# Patient Record
Sex: Female | Born: 1955 | Race: White | Hispanic: No | Smoking: Never smoker
Health system: Southern US, Community
[De-identification: ages and names within clinical notes are randomized; demographics above are authoritative.]

## PROBLEM LIST (undated history)

## (undated) DIAGNOSIS — I129 Hypertensive chronic kidney disease with stage 1 through stage 4 chronic kidney disease, or unspecified chronic kidney disease: Secondary | ICD-10-CM

## (undated) DIAGNOSIS — E785 Hyperlipidemia, unspecified: Secondary | ICD-10-CM

## (undated) DIAGNOSIS — N189 Chronic kidney disease, unspecified: Secondary | ICD-10-CM

## (undated) DIAGNOSIS — T7840XA Allergy, unspecified, initial encounter: Secondary | ICD-10-CM

## (undated) DIAGNOSIS — E119 Type 2 diabetes mellitus without complications: Secondary | ICD-10-CM

## (undated) DIAGNOSIS — L719 Rosacea, unspecified: Secondary | ICD-10-CM

## (undated) DIAGNOSIS — K219 Gastro-esophageal reflux disease without esophagitis: Secondary | ICD-10-CM

## (undated) DIAGNOSIS — I10 Essential (primary) hypertension: Secondary | ICD-10-CM

## (undated) DIAGNOSIS — K929 Disease of digestive system, unspecified: Secondary | ICD-10-CM

## (undated) DIAGNOSIS — R011 Cardiac murmur, unspecified: Secondary | ICD-10-CM

## (undated) DIAGNOSIS — M1990 Unspecified osteoarthritis, unspecified site: Secondary | ICD-10-CM

## (undated) HISTORY — DX: Allergy, unspecified, initial encounter: T78.40XA

## (undated) HISTORY — DX: Cardiac murmur, unspecified: R01.1

## (undated) HISTORY — DX: Rosacea, unspecified: L71.9

## (undated) HISTORY — DX: Chronic kidney disease, unspecified: N18.9

## (undated) HISTORY — DX: Essential (primary) hypertension: I10

## (undated) HISTORY — DX: Hypertensive chronic kidney disease with stage 1 through stage 4 chronic kidney disease, or unspecified chronic kidney disease: I12.9

## (undated) HISTORY — DX: Hyperlipidemia, unspecified: E78.5

## (undated) HISTORY — DX: Gastro-esophageal reflux disease without esophagitis: K21.9

## (undated) HISTORY — DX: Unspecified osteoarthritis, unspecified site: M19.90

## (undated) HISTORY — DX: Type 2 diabetes mellitus without complications: E11.9

## (undated) HISTORY — DX: Disease of digestive system, unspecified: K92.9

---

## 1983-12-09 HISTORY — PX: TUBAL LIGATION: SHX77

## 2014-10-23 LAB — HM MAMMOGRAPHY: HM Mammogram: NORMAL

## 2015-06-04 ENCOUNTER — Encounter: Payer: Self-pay | Admitting: Unknown Physician Specialty

## 2015-06-04 ENCOUNTER — Ambulatory Visit (INDEPENDENT_AMBULATORY_CARE_PROVIDER_SITE_OTHER): Payer: BLUE CROSS/BLUE SHIELD | Admitting: Unknown Physician Specialty

## 2015-06-04 VITALS — BP 141/79 | HR 80 | Temp 98.1°F | Ht 61.3 in | Wt 192.6 lb

## 2015-06-04 DIAGNOSIS — N181 Chronic kidney disease, stage 1: Secondary | ICD-10-CM

## 2015-06-04 DIAGNOSIS — B373 Candidiasis of vulva and vagina: Secondary | ICD-10-CM

## 2015-06-04 DIAGNOSIS — N185 Chronic kidney disease, stage 5: Secondary | ICD-10-CM | POA: Diagnosis not present

## 2015-06-04 DIAGNOSIS — N183 Chronic kidney disease, stage 3 (moderate): Secondary | ICD-10-CM

## 2015-06-04 DIAGNOSIS — N184 Chronic kidney disease, stage 4 (severe): Secondary | ICD-10-CM

## 2015-06-04 DIAGNOSIS — E785 Hyperlipidemia, unspecified: Secondary | ICD-10-CM

## 2015-06-04 DIAGNOSIS — I129 Hypertensive chronic kidney disease with stage 1 through stage 4 chronic kidney disease, or unspecified chronic kidney disease: Secondary | ICD-10-CM

## 2015-06-04 DIAGNOSIS — B3731 Acute candidiasis of vulva and vagina: Secondary | ICD-10-CM

## 2015-06-04 DIAGNOSIS — N189 Chronic kidney disease, unspecified: Secondary | ICD-10-CM | POA: Insufficient documentation

## 2015-06-04 DIAGNOSIS — N182 Chronic kidney disease, stage 2 (mild): Secondary | ICD-10-CM

## 2015-06-04 DIAGNOSIS — E1165 Type 2 diabetes mellitus with hyperglycemia: Secondary | ICD-10-CM | POA: Insufficient documentation

## 2015-06-04 DIAGNOSIS — E1122 Type 2 diabetes mellitus with diabetic chronic kidney disease: Secondary | ICD-10-CM | POA: Diagnosis not present

## 2015-06-04 DIAGNOSIS — E1169 Type 2 diabetes mellitus with other specified complication: Secondary | ICD-10-CM | POA: Insufficient documentation

## 2015-06-04 LAB — BAYER DCA HB A1C WAIVED: HB A1C (BAYER DCA - WAIVED): 8 % — ABNORMAL HIGH (ref <7.0)

## 2015-06-04 MED ORDER — PRAVASTATIN SODIUM 40 MG PO TABS: 40.0000 mg | ORAL_TABLET | Freq: Every day | ORAL | Status: DC

## 2015-06-04 MED ORDER — LOSARTAN POTASSIUM-HCTZ 100-12.5 MG PO TABS: 1.0000 | ORAL_TABLET | Freq: Every day | ORAL | Status: DC

## 2015-06-04 MED ORDER — SAXAGLIPTIN HCL 5 MG PO TABS: 5.0000 mg | ORAL_TABLET | Freq: Every day | ORAL | Status: DC

## 2015-06-04 MED ORDER — FLUCONAZOLE 150 MG PO TABS: 150.0000 mg | ORAL_TABLET | Freq: Once | ORAL | Status: DC

## 2015-06-04 MED ORDER — METFORMIN HCL 500 MG PO TABS: 500.0000 mg | ORAL_TABLET | Freq: Four times a day (QID) | ORAL | Status: DC

## 2015-06-04 NOTE — Assessment & Plan Note (Addendum)
Increase Losartan from 50-100.  Continue HCTZ at 12.5 mg.

## 2015-06-04 NOTE — Assessment & Plan Note (Addendum)
Since she has a hx of yeast.  Will rx Onglyza 5 mg.  Does not want an injectable

## 2015-06-04 NOTE — Progress Notes (Signed)
BP 141/79 mmHg  Pulse 80  Temp(Src) 98.1 F (36.7 C)  Ht 5' 1.3" (1.557 m)  Wt 192 lb 9.6 oz (87.363 kg)  BMI 36.04 kg/m2  SpO2 95%  LMP  (LMP Unknown)   Subjective:    Patient ID: Christine Booth, female    DOB: 1956/01/28, 59 y.o.   MRN: [id number]  HPI: Christine Booth is a 60 y.o. female  Chief Complaint  Patient presents with  . Diabetes  . Hypertension  . Hyperlipidemia   Diabetes She presents for her follow-up diabetic visit. She has type 2 diabetes mellitus. There are no hypoglycemic associated symptoms. Pertinent negatives for hypoglycemia include no headaches or sweats. Pertinent negatives for diabetes include no blurred vision and no chest pain. There are no hypoglycemic complications. Diabetic complications include nephropathy. Current diabetic treatment includes oral agent (monotherapy) (Metformin). She is compliant with treatment all of the time. Frequency home blood tests: very occasional.  200 this AM. Her home blood glucose trend is increasing steadily. An ACE inhibitor/angiotensin II receptor blocker is being taken. She does not see a podiatrist.Eye exam is not current.  Hypertension This is a new problem. Associated symptoms include malaise/fatigue. Pertinent negatives include no anxiety, blurred vision, chest pain, headaches, neck pain, palpitations, peripheral edema, shortness of breath or sweats. There are no compliance problems.  Hypertensive end-organ damage includes kidney disease.  Hyperlipidemia This is a chronic problem. The problem is controlled. Pertinent negatives include no chest pain, myalgias or shortness of breath. Current antihyperlipidemic treatment includes exercise and statins. There are no compliance problems.     Relevant past medical, surgical, family and social history reviewed and updated as indicated. Interim medical history since our last visit reviewed. Allergies and medications reviewed and updated.  Review of Systems   Constitutional: Positive for malaise/fatigue.  Eyes: Negative for blurred vision.  Respiratory: Negative for shortness of breath.   Cardiovascular: Negative for chest pain and palpitations.  Genitourinary:       Keeps a yeast infection.  Diflucan helped in the past.  Would like a refill  Musculoskeletal: Negative for myalgias and neck pain.  Neurological: Negative for headaches.    Per HPI unless specifically indicated above     Objective:    BP 141/79 mmHg  Pulse 80  Temp(Src) 98.1 F (36.7 C)  Ht 5' 1.3" (1.557 m)  Wt 192 lb 9.6 oz (87.363 kg)  BMI 36.04 kg/m2  SpO2 95%  LMP  (LMP Unknown)  Wt Readings from Last 3 Encounters:  06/04/15 192 lb 9.6 oz (87.363 kg)  03/06/15 196 lb (88.905 kg)    Physical Exam  Constitutional: She is oriented to person, place, and time. She appears well-developed and well-nourished. No distress.  HENT:  Head: Normocephalic and atraumatic.  Eyes: Conjunctivae and lids are normal. Right eye exhibits no discharge. Left eye exhibits no discharge. No scleral icterus.  Cardiovascular: Normal rate, regular rhythm and normal heart sounds.   Pulmonary/Chest: Effort normal and breath sounds normal. No respiratory distress.  Abdominal: Normal appearance. There is no splenomegaly or hepatomegaly.  Musculoskeletal: Normal range of motion.  Neurological: She is alert and oriented to person, place, and time.  Skin: Skin is intact. No rash noted. No pallor.  Psychiatric: She has a normal mood and affect. Her behavior is normal. Judgment and thought content normal.    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  Will rx Onglyza 5 mg.  Does not want an injectable      Relevant  Medications   losartan-hydrochlorothiazide (HYZAAR) 100-12.5 MG per tablet   metFORMIN (GLUCOPHAGE) 500 MG tablet   pravastatin (PRAVACHOL) 40 MG tablet   saxagliptin HCl (ONGLYZA) 5 MG TABS tablet   Other Relevant Orders   Comprehensive metabolic panel   Bayer DCA Hb A1c Waived   POCT glucose (manual entry)     Genitourinary   Chronic kidney disease   Relevant Orders   Comprehensive metabolic panel   Bayer DCA Hb A1c Waived   Hypertensive nephropathy    Increase Losartan from 50-100.  Continue HCTZ at 12.5 mg.        Relevant Medications   losartan-hydrochlorothiazide (HYZAAR) 100-12.5 MG per tablet   Other Relevant Orders   Comprehensive metabolic panel   Bayer DCA Hb A1c Waived     Other   Hyperlipidemia   Relevant Medications   losartan-hydrochlorothiazide (HYZAAR) 100-12.5 MG per tablet   pravastatin (PRAVACHOL) 40 MG tablet    Other Visit Diagnoses    Yeast infection of the vagina        I have seen before on examination.  Will rx Diflucan    Relevant Medications    fluconazole (DIFLUCAN) 150 MG tablet        Follow up plan: Return in about 3 months (around 09/04/2015).  Needs Lipid panel, CMP, Hgb A1C, CMP, Microalbumin, and Uric Acid

## 2015-06-05 LAB — COMPREHENSIVE METABOLIC PANEL
ALT: 16 IU/L (ref 0–32)
AST: 19 IU/L (ref 0–40)
Albumin/Globulin Ratio: 1.9 (ref 1.1–2.5)
Albumin: 4.4 g/dL (ref 3.6–4.8)
Alkaline Phosphatase: 77 IU/L (ref 39–117)
BUN/Creatinine Ratio: 14 (ref 11–26)
BUN: 10 mg/dL (ref 8–27)
Bilirubin Total: 0.3 mg/dL (ref 0.0–1.2)
CO2: 21 mmol/L (ref 18–29)
Calcium: 9.4 mg/dL (ref 8.7–10.3)
Chloride: 100 mmol/L (ref 97–108)
Creatinine, Ser: 0.72 mg/dL (ref 0.57–1.00)
GFR calc Af Amer: 105 mL/min/{1.73_m2} (ref >59)
GFR calc non Af Amer: 91 mL/min/{1.73_m2} (ref >59)
Globulin, Total: 2.3 g/dL (ref 1.5–4.5)
Glucose: 202 mg/dL — ABNORMAL HIGH (ref 65–99)
Potassium: 4.3 mmol/L (ref 3.5–5.2)
Sodium: 140 mmol/L (ref 134–144)
Total Protein: 6.7 g/dL (ref 6.0–8.5)

## 2015-06-13 ENCOUNTER — Telehealth: Payer: Self-pay

## 2015-09-04 ENCOUNTER — Ambulatory Visit (INDEPENDENT_AMBULATORY_CARE_PROVIDER_SITE_OTHER): Payer: BLUE CROSS/BLUE SHIELD | Admitting: Unknown Physician Specialty

## 2015-09-04 ENCOUNTER — Encounter: Payer: Self-pay | Admitting: Unknown Physician Specialty

## 2015-09-04 VITALS — BP 128/79 | HR 78 | Temp 98.5°F | Ht 61.2 in | Wt 189.8 lb

## 2015-09-04 DIAGNOSIS — N181 Chronic kidney disease, stage 1: Secondary | ICD-10-CM

## 2015-09-04 DIAGNOSIS — N183 Chronic kidney disease, stage 3 (moderate): Secondary | ICD-10-CM

## 2015-09-04 DIAGNOSIS — N182 Chronic kidney disease, stage 2 (mild): Secondary | ICD-10-CM

## 2015-09-04 DIAGNOSIS — I129 Hypertensive chronic kidney disease with stage 1 through stage 4 chronic kidney disease, or unspecified chronic kidney disease: Secondary | ICD-10-CM | POA: Diagnosis not present

## 2015-09-04 DIAGNOSIS — E1122 Type 2 diabetes mellitus with diabetic chronic kidney disease: Secondary | ICD-10-CM | POA: Diagnosis not present

## 2015-09-04 DIAGNOSIS — E785 Hyperlipidemia, unspecified: Secondary | ICD-10-CM | POA: Diagnosis not present

## 2015-09-04 DIAGNOSIS — N185 Chronic kidney disease, stage 5: Secondary | ICD-10-CM

## 2015-09-04 DIAGNOSIS — N184 Chronic kidney disease, stage 4 (severe): Secondary | ICD-10-CM | POA: Diagnosis not present

## 2015-09-04 DIAGNOSIS — B373 Candidiasis of vulva and vagina: Secondary | ICD-10-CM | POA: Diagnosis not present

## 2015-09-04 DIAGNOSIS — B3731 Acute candidiasis of vulva and vagina: Secondary | ICD-10-CM

## 2015-09-04 DIAGNOSIS — N189 Chronic kidney disease, unspecified: Secondary | ICD-10-CM

## 2015-09-04 DIAGNOSIS — Z23 Encounter for immunization: Secondary | ICD-10-CM | POA: Diagnosis not present

## 2015-09-04 LAB — LIPID PANEL PICCOLO, WAIVED
Chol/HDL Ratio Piccolo,Waive: 3.2 mg/dL
Cholesterol Piccolo, Waived: 149 mg/dL (ref <200)
HDL Chol Piccolo, Waived: 47 mg/dL — ABNORMAL LOW (ref >59)
LDL Chol Calc Piccolo Waived: 75 mg/dL (ref <100)
Triglycerides Piccolo,Waived: 135 mg/dL (ref <150)
VLDL Chol Calc Piccolo,Waive: 27 mg/dL (ref <30)

## 2015-09-04 LAB — MICROALBUMIN, URINE WAIVED
Creatinine, Urine Waived: 200 mg/dL (ref 10–300)
Microalb, Ur Waived: 10 mg/L (ref 0–19)
Microalb/Creat Ratio: <30 mg/g (ref <30)

## 2015-09-04 LAB — BAYER DCA HB A1C WAIVED: HB A1C (BAYER DCA - WAIVED): 7.4 % — ABNORMAL HIGH (ref <7.0)

## 2015-09-04 MED ORDER — FLUCONAZOLE 150 MG PO TABS: 150.0000 mg | ORAL_TABLET | Freq: Every day | ORAL | Status: DC

## 2015-09-04 NOTE — Progress Notes (Signed)
BP 128/79 mmHg  Pulse 78  Temp(Src) 98.5 F (36.9 C)  Ht 5' 1.2" (1.554 m)  Wt 189 lb 12.8 oz (86.093 kg)  BMI 35.65 kg/m2  SpO2 98%  LMP  (LMP Unknown)   Subjective:    Patient ID: Christine Booth, female    DOB: 08-31-1956, 59 y.o.   MRN: [id number]  HPI: Christine Booth is a 59 y.o. female  Chief Complaint  Patient presents with  . Diabetes  . Hyperlipidemia  . Hypertension   Diabetes: Follow up visit of type 2 diabetes. Last visit A1C was increased to 8.0. Saxagliptan was added to regimen with continuation of metformin. Compliant with treatment, no missed doses.She was constipated for a few days after starting saxagliptan and took stool softeners without further difficulties.. No No diabetic complications. Denies headaches, diaphoresis, chest pain, blurred vision.   Hypertension/Hyperlipidemia: Blood pressure was not under control at last visit and lotensin was increased. She has not monitored her blood pressure at home. She denies headaches, chest pain, peripheral edema or shortness of breath. Blood pressure is well controlled today. She has been compliant with medication, no missed doses. Currently taking pravastatin, controlled cholesterol levels.   Yeast Infection: Recurrent infection. Provided prescription of diflucan which has worked in the past.     Relevant past medical, surgical, family and social history reviewed and updated as indicated. Interim medical history since our last visit reviewed. Allergies and medications reviewed and updated.  Review of Systems  Constitutional: Negative.  Negative for fever, chills, activity change, appetite change and fatigue.  HENT: Negative.  Negative for congestion, ear pain, postnasal drip, rhinorrhea, sinus pressure, sneezing, sore throat and tinnitus.   Eyes: Negative.  Negative for discharge and redness.  Respiratory: Negative.  Negative for cough, chest tightness, shortness of breath, wheezing and stridor.    Cardiovascular: Negative.  Negative for chest pain, palpitations and leg swelling.  Gastrointestinal: Negative.  Negative for nausea, diarrhea and constipation.  Genitourinary: Negative.  Negative for dysuria, frequency, hematuria and difficulty urinating.  Musculoskeletal: Negative.  Negative for back pain, joint swelling and arthralgias.  Skin: Negative.  Negative for color change, pallor and rash.  Neurological: Negative for dizziness, syncope, light-headedness, numbness and headaches.  Psychiatric/Behavioral: Negative.  Negative for confusion, sleep disturbance, self-injury, decreased concentration and agitation. The patient is not nervous/anxious.     Per HPI unless specifically indicated above     Objective:    BP 128/79 mmHg  Pulse 78  Temp(Src) 98.5 F (36.9 C)  Ht 5' 1.2" (1.554 m)  Wt 189 lb 12.8 oz (86.093 kg)  BMI 35.65 kg/m2  SpO2 98%  LMP  (LMP Unknown)  Wt Readings from Last 3 Encounters:  09/04/15 189 lb 12.8 oz (86.093 kg)  06/04/15 192 lb 9.6 oz (87.363 kg)  03/06/15 196 lb (88.905 kg)    Physical Exam  Constitutional: She is oriented to person, place, and time. She appears well-developed and well-nourished. No distress.  HENT:  Head: Normocephalic and atraumatic.  Eyes: Conjunctivae are normal.  Neck: Normal range of motion.  Cardiovascular: Normal rate, regular rhythm and normal heart sounds.  Exam reveals no gallop and no friction rub.   No murmur heard. Pulmonary/Chest: Effort normal and breath sounds normal. No respiratory distress. She has no wheezes. She has no rales.  Musculoskeletal: Normal range of motion. She exhibits no edema or tenderness.  Neurological: She is alert and oriented to person, place, and time.  Skin: Skin is warm and dry. No  rash noted. She is not diaphoretic. No erythema. No pallor.  Psychiatric: She has a normal mood and affect. Her behavior is normal. Thought content normal.        Assessment & Plan:   Problem List Items  Addressed This Visit      Unprioritized   Hyperlipidemia    Labs stable. Well controlled on pravastatin 40mg .      Relevant Orders   Lipid Panel [place], Waived   Type 2 diabetes mellitus with diabetic chronic kidney disease    A1C 7.4 today. Improved from three months ago. Will continue working on diet and exercise and recheck in three months at physical.      Relevant Orders   Bayer DCA Hb A1c Waived   Microalbumin, Urine Waived   Uric acid   Chronic kidney disease   Relevant Orders   Comprehensive metabolic panel   Hypertensive nephropathy    Well controlled on increased regimen of losartan-hydrochlorothiazide 100-12.5mg .        Other Visit Diagnoses    Immunization due    -  Primary    Relevant Orders    Flu Vaccine QUAD 36+ mos PF IM (Fluarix & Fluzone Quad PF) (Completed)    Vaginal yeast infection        Recurrent condition. Prescription given for diflucan    Relevant Medications    fluconazole (DIFLUCAN) 150 MG tablet        Follow up plan: Return in about 3 months (around 12/04/2015) for physical.

## 2015-09-04 NOTE — Assessment & Plan Note (Signed)
Labs stable. Well controlled on pravastatin 40mg .

## 2015-09-04 NOTE — Assessment & Plan Note (Signed)
A1C 7.4 today. Improved from three months ago. Will continue working on diet and exercise and recheck in three months at physical.

## 2015-09-04 NOTE — Assessment & Plan Note (Signed)
Well controlled on increased regimen of losartan-hydrochlorothiazide 100-12.5mg .

## 2015-09-05 LAB — COMPREHENSIVE METABOLIC PANEL
ALT: 15 IU/L (ref 0–32)
AST: 16 IU/L (ref 0–40)
Albumin/Globulin Ratio: 2 (ref 1.1–2.5)
Albumin: 4.3 g/dL (ref 3.6–4.8)
Alkaline Phosphatase: 67 IU/L (ref 39–117)
BUN/Creatinine Ratio: 13 (ref 11–26)
BUN: 10 mg/dL (ref 8–27)
Bilirubin Total: 0.2 mg/dL (ref 0.0–1.2)
CO2: 20 mmol/L (ref 18–29)
Calcium: 9.5 mg/dL (ref 8.7–10.3)
Chloride: 102 mmol/L (ref 97–108)
Creatinine, Ser: 0.75 mg/dL (ref 0.57–1.00)
GFR calc Af Amer: 100 mL/min/{1.73_m2} (ref >59)
GFR calc non Af Amer: 87 mL/min/{1.73_m2} (ref >59)
Globulin, Total: 2.2 g/dL (ref 1.5–4.5)
Glucose: 163 mg/dL — ABNORMAL HIGH (ref 65–99)
Potassium: 4 mmol/L (ref 3.5–5.2)
Sodium: 141 mmol/L (ref 134–144)
Total Protein: 6.5 g/dL (ref 6.0–8.5)

## 2015-09-05 LAB — URIC ACID: Uric Acid: 3.6 mg/dL (ref 2.5–7.1)

## 2015-10-17 ENCOUNTER — Other Ambulatory Visit: Payer: Self-pay | Admitting: Unknown Physician Specialty

## 2015-11-12 ENCOUNTER — Other Ambulatory Visit: Payer: Self-pay | Admitting: Unknown Physician Specialty

## 2015-11-13 NOTE — Telephone Encounter (Signed)
done

## 2015-11-15 ENCOUNTER — Encounter: Payer: Self-pay | Admitting: Family Medicine

## 2015-11-15 ENCOUNTER — Ambulatory Visit (INDEPENDENT_AMBULATORY_CARE_PROVIDER_SITE_OTHER): Payer: BLUE CROSS/BLUE SHIELD | Admitting: Family Medicine

## 2015-11-15 VITALS — BP 132/84 | HR 86 | Temp 98.6°F | Ht 62.0 in | Wt 192.6 lb

## 2015-11-15 DIAGNOSIS — R399 Unspecified symptoms and signs involving the genitourinary system: Secondary | ICD-10-CM

## 2015-11-15 DIAGNOSIS — R3129 Other microscopic hematuria: Secondary | ICD-10-CM

## 2015-11-15 DIAGNOSIS — Z8619 Personal history of other infectious and parasitic diseases: Secondary | ICD-10-CM

## 2015-11-15 DIAGNOSIS — Z8742 Personal history of other diseases of the female genital tract: Secondary | ICD-10-CM | POA: Diagnosis not present

## 2015-11-15 LAB — MICROSCOPIC EXAMINATION

## 2015-11-15 MED ORDER — NITROFURANTOIN MONOHYD MACRO 100 MG PO CAPS: 100.0000 mg | ORAL_CAPSULE | Freq: Two times a day (BID) | ORAL | Status: DC

## 2015-11-15 MED ORDER — FLUCONAZOLE 150 MG PO TABS: 150.0000 mg | ORAL_TABLET | Freq: Every day | ORAL | Status: DC

## 2015-11-15 NOTE — Progress Notes (Signed)
BP 132/84 mmHg  Pulse 86  Temp(Src) 98.6 F (37 C)  Ht 5\' 2"  (1.575 m)  Wt 192 lb 9.6 oz (87.363 kg)  BMI 35.22 kg/m2  SpO2 98%  LMP  (LMP Unknown)   Subjective:    Patient ID: Christine Booth, female    DOB: Jul 04, 1956, 59 y.o.   MRN: [id number]  HPI: Christine Booth is a 59 y.o. female  Chief Complaint  Patient presents with  . Dysuria  . Urinary Frequency  . Urinary Urgency   Urinary frequency and urgency; no foul smell She has some burning at the end of urination; pressure very low No recent bladder infections No fevers No long car trips or holding back her urine No pain in the lower back or flank Had some pain in the left hip, just when laying down  No nausea or vomiting Not sexually active, no horseback riding NKDA No recent antibiotics  She has diabetes, and get the external yeast infections; getting a little better lately; not taking diabetic pill that increases risk of urine or yeast infections My blessing to not check FSBS  Past Medical History  Diagnosis Date  . Allergy   . Hyperlipidemia   . Hypertension   . Diabetes mellitus without complication ([place])   . GERD (gastroesophageal reflux disease)   . Chronic kidney disease stage 1  . Heart murmur   . Gastrointestinal disorder   . Rosacea    Relevant past medical, surgical, family and social history reviewed and updated as indicated. Interim medical history since our last visit reviewed. Allergies and medications reviewed and updated.  Review of Systems Per HPI unless specifically indicated above     Objective:    BP 132/84 mmHg  Pulse 86  Temp(Src) 98.6 F (37 C)  Ht 5\' 2"  (1.575 m)  Wt 192 lb 9.6 oz (87.363 kg)  BMI 35.22 kg/m2  SpO2 98%  LMP  (LMP Unknown)  Wt Readings from Last 3 Encounters:  11/15/15 192 lb 9.6 oz (87.363 kg)  09/04/15 189 lb 12.8 oz (86.093 kg)  06/04/15 192 lb 9.6 oz (87.363 kg)    Physical Exam  Constitutional: She appears well-developed and  well-nourished. No distress.  Eyes: No scleral icterus.  Cardiovascular: Normal rate and regular rhythm.   Pulmonary/Chest: Effort normal and breath sounds normal.  Abdominal: Soft. She exhibits no distension. There is no tenderness. There is no guarding.  Skin: She is not diaphoretic.  Psychiatric: She has a normal mood and affect.  Urine micro: WBC 11-30 per hpf, RBC 11-30 per hpf    Assessment & Plan:   Problem List Items Addressed This Visit    None    Visit Diagnoses    UTI symptoms    -  Primary    hydration, start antibiotics; culture pending; see AVS    Relevant Medications    fluconazole (DIFLUCAN) 150 MG tablet    nitrofurantoin, macrocrystal-monohydrate, (MACROBID) 100 MG capsule    Other Relevant Orders    UA/M w/rflx Culture, Routine (Completed)    Microscopic hematuria        recheck urine at follow-up with primary in January; discussed with patient    Hx of candidal vulvovaginitis        Rx for fluconazole given to be used only if needed; do not take statin for 3 days if this is used       Follow up plan: Return if symptoms worsen or fail to improve.  Meds ordered this encounter  Medications  . fluconazole (DIFLUCAN) 150 MG tablet    Sig: Take 1 tablet (150 mg total) by mouth daily.    Dispense:  1 tablet    Refill:  0  . nitrofurantoin, macrocrystal-monohydrate, (MACROBID) 100 MG capsule    Sig: Take 1 capsule (100 mg total) by mouth 2 (two) times daily.    Dispense:  6 capsule    Refill:  0   An after-visit summary was printed and given to the patient at [place].  Please see the patient instructions which may contain other information and recommendations beyond what is mentioned above in the assessment and plan.

## 2015-11-15 NOTE — Patient Instructions (Signed)
If you take the fluconazole, do not take your cholesterol medicine for 3 days Start the new antibiotics Hydration and rest Watch for s/s of worsening infection: fever, back pain, vomiting Call us if needed When you see Christine Booth in January, get a recheck of your urine to make sure the blood is gone

## 2015-11-18 LAB — URINE CULTURE, REFLEX

## 2015-11-19 ENCOUNTER — Telehealth: Payer: Self-pay | Admitting: Family Medicine

## 2015-11-19 LAB — UA/M W/RFLX CULTURE, ROUTINE

## 2015-11-19 NOTE — Telephone Encounter (Signed)
I called home to let patient know medicine should have done the trick; call if any symptoms or problems Treated with macrobid; sensitive on culture

## 2015-12-11 ENCOUNTER — Ambulatory Visit (INDEPENDENT_AMBULATORY_CARE_PROVIDER_SITE_OTHER): Payer: BLUE CROSS/BLUE SHIELD | Admitting: Unknown Physician Specialty

## 2015-12-11 ENCOUNTER — Encounter: Payer: Self-pay | Admitting: Unknown Physician Specialty

## 2015-12-11 VITALS — BP 140/87 | HR 93 | Temp 98.7°F | Ht 61.3 in | Wt 190.2 lb

## 2015-12-11 DIAGNOSIS — Z23 Encounter for immunization: Secondary | ICD-10-CM

## 2015-12-11 DIAGNOSIS — I129 Hypertensive chronic kidney disease with stage 1 through stage 4 chronic kidney disease, or unspecified chronic kidney disease: Secondary | ICD-10-CM | POA: Insufficient documentation

## 2015-12-11 DIAGNOSIS — N189 Chronic kidney disease, unspecified: Secondary | ICD-10-CM

## 2015-12-11 DIAGNOSIS — J0100 Acute maxillary sinusitis, unspecified: Secondary | ICD-10-CM

## 2015-12-11 DIAGNOSIS — E785 Hyperlipidemia, unspecified: Secondary | ICD-10-CM | POA: Diagnosis not present

## 2015-12-11 DIAGNOSIS — N898 Other specified noninflammatory disorders of vagina: Secondary | ICD-10-CM

## 2015-12-11 DIAGNOSIS — L298 Other pruritus: Secondary | ICD-10-CM

## 2015-12-11 DIAGNOSIS — E1122 Type 2 diabetes mellitus with diabetic chronic kidney disease: Secondary | ICD-10-CM | POA: Diagnosis not present

## 2015-12-11 DIAGNOSIS — Z0000 Encounter for general adult medical examination without abnormal findings: Secondary | ICD-10-CM

## 2015-12-11 HISTORY — DX: Hypertensive chronic kidney disease with stage 1 through stage 4 chronic kidney disease, or unspecified chronic kidney disease: I12.9

## 2015-12-11 LAB — MICROALBUMIN, URINE WAIVED
Creatinine, Urine Waived: 100 mg/dL (ref 10–300)
Microalb, Ur Waived: 10 mg/L (ref 0–19)
Microalb/Creat Ratio: <30 mg/g (ref <30)

## 2015-12-11 LAB — UA/M W/RFLX CULTURE, ROUTINE
Bilirubin, UA: NEGATIVE
Glucose, UA: NEGATIVE
Ketones, UA: NEGATIVE
Leukocytes, UA: NEGATIVE
Nitrite, UA: NEGATIVE
Protein, UA: NEGATIVE
RBC, UA: NEGATIVE
Specific Gravity, UA: 1.015 (ref 1.005–1.030)
Urobilinogen, Ur: 0.2 mg/dL (ref 0.2–1.0)
pH, UA: 6.5 (ref 5.0–7.5)

## 2015-12-11 MED ORDER — AZITHROMYCIN 250 MG PO TABS: ORAL_TABLET | ORAL | Status: DC

## 2015-12-11 MED ORDER — METFORMIN HCL 500 MG PO TABS: 500.0000 mg | ORAL_TABLET | Freq: Four times a day (QID) | ORAL | Status: DC

## 2015-12-11 MED ORDER — LOSARTAN POTASSIUM-HCTZ 100-12.5 MG PO TABS: 1.0000 | ORAL_TABLET | Freq: Every day | ORAL | Status: DC

## 2015-12-11 MED ORDER — FLUTICASONE PROPIONATE 50 MCG/ACT NA SUSP: 2.0000 | Freq: Every day | NASAL | Status: DC

## 2015-12-11 MED ORDER — SAXAGLIPTIN HCL 5 MG PO TABS: 5.0000 mg | ORAL_TABLET | Freq: Every day | ORAL | Status: DC

## 2015-12-11 MED ORDER — PANTOPRAZOLE SODIUM 40 MG PO TBEC: 40.0000 mg | DELAYED_RELEASE_TABLET | Freq: Every day | ORAL | Status: DC

## 2015-12-11 MED ORDER — PRAVASTATIN SODIUM 40 MG PO TABS: 40.0000 mg | ORAL_TABLET | Freq: Every day | ORAL | Status: DC

## 2015-12-11 MED ORDER — FLUCONAZOLE 150 MG PO TABS: 150.0000 mg | ORAL_TABLET | Freq: Once | ORAL | Status: DC

## 2015-12-11 NOTE — Assessment & Plan Note (Signed)
Hgb A1C is pending

## 2015-12-11 NOTE — Assessment & Plan Note (Signed)
Lipid panel is pending

## 2015-12-11 NOTE — Progress Notes (Signed)
BP 140/87 mmHg  Pulse 93  Temp(Src) 98.7 F (37.1 C)  Ht 5' 1.3" (1.557 m)  Wt 190 lb 3.2 oz (86.274 kg)  BMI 35.59 kg/m2  SpO2 97%  LMP  (LMP Unknown)   Subjective:    Patient ID: Christine Booth, female    DOB: 08-Dec-1956, 60 y.o.   MRN: [id number]  HPI: Christine Booth is a 60 y.o. female  Chief Complaint  Patient presents with  . Annual Exam    pt states she would like to get shingles vaccine if good with provider  . URI    pt states she has been having some congestion that started in nasal but has moved to her chest  . Vaginal Itching    pt states she has been having some itching on the outside of her vagina   URI  This is a new problem. The current episode started 1 to 4 weeks ago. The problem has been waxing and waning. There has been no fever. Associated symptoms include congestion and coughing. She has tried nothing for the symptoms.  Vaginal Itching The patient's primary symptoms include genital itching. The patient's pertinent negatives include no vaginal discharge. This is a chronic problem. The current episode started more than 1 month ago. The problem occurs intermittently. The problem has been unchanged. The patient is experiencing no pain. The problem affects both sides.   Diabetes:  Using medications without difficulties No hypoglycemic episodes No hyperglycemic episodes Feet problems:none Blood Sugars averaging: No eye exam within last year and patient asked to get another  Hypertension:  Using medications without difficulty Average home BPs: not checking   Using medication without problems or lightheadedness No chest pain with exertion or shortness of breath No Edema   Elevated Cholesterol: Using medications without problems: No Muscle aches:none  Diet compliance:"I could do better" Exercise:Zumba 3 days/week      Relevant past medical, surgical, family and social history reviewed and updated as indicated. Interim medical history  since our last visit reviewed. Allergies and medications reviewed and updated.  Review of Systems  HENT: Positive for congestion.   Respiratory: Positive for cough.   Genitourinary: Negative for vaginal discharge.    Per HPI unless specifically indicated above     Objective:    BP 140/87 mmHg  Pulse 93  Temp(Src) 98.7 F (37.1 C)  Ht 5' 1.3" (1.557 m)  Wt 190 lb 3.2 oz (86.274 kg)  BMI 35.59 kg/m2  SpO2 97%  LMP  (LMP Unknown)  Wt Readings from Last 3 Encounters:  12/11/15 190 lb 3.2 oz (86.274 kg)  11/15/15 192 lb 9.6 oz (87.363 kg)  09/04/15 189 lb 12.8 oz (86.093 kg)    Physical Exam  Constitutional: She is oriented to person, place, and time. She appears well-developed and well-nourished.  HENT:  Head: Normocephalic and atraumatic.  Eyes: Pupils are equal, round, and reactive to light. Right eye exhibits no discharge. Left eye exhibits no discharge. No scleral icterus.  Neck: Normal range of motion. Neck supple. Carotid bruit is not present. No thyromegaly present.  Cardiovascular: Normal rate, regular rhythm and normal heart sounds.  Exam reveals no gallop and no friction rub.   No murmur heard. Pulmonary/Chest: Effort normal and breath sounds normal. No respiratory distress. She has no wheezes. She has no rales.  Abdominal: Soft. Bowel sounds are normal. There is no tenderness. There is no rebound.  Genitourinary: No breast swelling, tenderness or discharge.  Whitening hood of labia  Musculoskeletal:  Normal range of motion.  Lymphadenopathy:    She has no cervical adenopathy.  Neurological: She is alert and oriented to person, place, and time.  Skin: Skin is warm, dry and intact. No rash noted.  Psychiatric: She has a normal mood and affect. Her speech is normal and behavior is normal. Judgment and thought content normal. Cognition and memory are normal.    Results for orders placed or performed in visit on 11/15/15  Microscopic Examination  Result Value Ref  Range   WBC, UA 11-30 (A) 0 -  5 /hpf   RBC, UA 11-30 (A) 0 -  2 /hpf   Epithelial Cells (non renal) 0-10 0 - 10 /hpf   Bacteria, UA Few None seen/Few  UA/M w/rflx Culture, Routine  Result Value Ref Range   Urine Culture, Routine Final report (A)    Urine Culture result 1 Comment (A)    ANTIMICROBIAL SUSCEPTIBILITY Comment   Urine Culture, Routine  Result Value Ref Range   Urine Culture result 1 Gram negative rods (A)       Assessment & Plan:   Problem List Items Addressed This Visit      Unprioritized   Hyperlipidemia    Lipid panel is pending      Relevant Medications   losartan-hydrochlorothiazide (HYZAAR) 100-12.5 MG tablet   pravastatin (PRAVACHOL) 40 MG tablet   Other Relevant Orders   Lipid Panel w/o Chol/HDL Ratio   Diabetes type 2, controlled (HCC) - Primary    Hgb A1C is pending      Relevant Medications   losartan-hydrochlorothiazide (HYZAAR) 100-12.5 MG tablet   metFORMIN (GLUCOPHAGE) 500 MG tablet   saxagliptin HCl (ONGLYZA) 5 MG TABS tablet   pravastatin (PRAVACHOL) 40 MG tablet   Benign hypertension with chronic kidney disease   Relevant Medications   losartan-hydrochlorothiazide (HYZAAR) 100-12.5 MG tablet   pravastatin (PRAVACHOL) 40 MG tablet   Other Relevant Orders   Comprehensive metabolic panel   Microalbumin, Urine Waived   Uric acid    Other Visit Diagnoses    Routine general medical examination at a health care facility        Relevant Orders    HIV antibody    Hepatitis C antibody    TSH    CBC    Cologuard    Immunization due        Relevant Orders    Varicella-zoster vaccine subcutaneous (Completed)    Itching in the vaginal area        Rx for Diflucan.  I suspect yeast    Relevant Orders    UA/M w/rflx Culture, Routine    Acute maxillary sinusitis, recurrence not specified        Relevant Medications    fluconazole (DIFLUCAN) 150 MG tablet    fluticasone (FLONASE) 50 MCG/ACT nasal spray    azithromycin (ZITHROMAX Z-PAK) 250  MG tablet    Hypertensive nephropathy, stage 1-4 or unspecified chronic kidney disease (HCC)        Relevant Medications    losartan-hydrochlorothiazide (HYZAAR) 100-12.5 MG tablet        Follow up plan: Return in about 3 months (around 03/10/2016) for f/u.

## 2015-12-12 LAB — URIC ACID: Uric Acid: 3.8 mg/dL (ref 2.5–7.1)

## 2015-12-12 LAB — CBC
Hematocrit: 39.7 % (ref 34.0–46.6)
Hemoglobin: 12.9 g/dL (ref 11.1–15.9)
MCH: 27.6 pg (ref 26.6–33.0)
MCHC: 32.5 g/dL (ref 31.5–35.7)
MCV: 85 fL (ref 79–97)
Platelets: 282 10*3/uL (ref 150–379)
RBC: 4.68 x10E6/uL (ref 3.77–5.28)
RDW: 14.7 % (ref 12.3–15.4)
WBC: 3.5 10*3/uL (ref 3.4–10.8)

## 2015-12-12 LAB — COMPREHENSIVE METABOLIC PANEL
ALT: 28 IU/L (ref 0–32)
AST: 27 IU/L (ref 0–40)
Albumin/Globulin Ratio: 1.9 (ref 1.1–2.5)
Albumin: 4.3 g/dL (ref 3.6–4.8)
Alkaline Phosphatase: 73 IU/L (ref 39–117)
BUN/Creatinine Ratio: 9 — ABNORMAL LOW (ref 11–26)
BUN: 6 mg/dL — ABNORMAL LOW (ref 8–27)
Bilirubin Total: 0.4 mg/dL (ref 0.0–1.2)
CO2: 23 mmol/L (ref 18–29)
Calcium: 9.6 mg/dL (ref 8.7–10.3)
Chloride: 101 mmol/L (ref 96–106)
Creatinine, Ser: 0.68 mg/dL (ref 0.57–1.00)
GFR calc Af Amer: 109 mL/min/{1.73_m2} (ref >59)
GFR calc non Af Amer: 95 mL/min/{1.73_m2} (ref >59)
Globulin, Total: 2.3 g/dL (ref 1.5–4.5)
Glucose: 150 mg/dL — ABNORMAL HIGH (ref 65–99)
Potassium: 4.1 mmol/L (ref 3.5–5.2)
Sodium: 141 mmol/L (ref 134–144)
Total Protein: 6.6 g/dL (ref 6.0–8.5)

## 2015-12-12 LAB — LIPID PANEL W/O CHOL/HDL RATIO
Cholesterol, Total: 136 mg/dL (ref 100–199)
HDL: 38 mg/dL — ABNORMAL LOW (ref >39)
LDL Calculated: 69 mg/dL (ref 0–99)
Triglycerides: 143 mg/dL (ref 0–149)
VLDL Cholesterol Cal: 29 mg/dL (ref 5–40)

## 2015-12-12 LAB — TSH: TSH: 2.21 u[IU]/mL (ref 0.450–4.500)

## 2015-12-12 LAB — HEPATITIS C ANTIBODY: Hep C Virus Ab: <0.1 s/co ratio (ref 0.0–0.9)

## 2015-12-12 LAB — HIV ANTIBODY (ROUTINE TESTING W REFLEX): HIV Screen 4th Generation wRfx: NONREACTIVE

## 2015-12-13 LAB — HGB A1C W/O EAG: Hgb A1c MFr Bld: 7.5 % — ABNORMAL HIGH (ref 4.8–5.6)

## 2015-12-13 LAB — SPECIMEN STATUS REPORT

## 2016-02-13 ENCOUNTER — Encounter: Payer: Self-pay | Admitting: Unknown Physician Specialty

## 2016-02-13 ENCOUNTER — Ambulatory Visit (INDEPENDENT_AMBULATORY_CARE_PROVIDER_SITE_OTHER): Payer: BLUE CROSS/BLUE SHIELD | Admitting: Unknown Physician Specialty

## 2016-02-13 VITALS — BP 130/65 | HR 89 | Temp 98.4°F | Ht 60.7 in | Wt 194.6 lb

## 2016-02-13 DIAGNOSIS — R300 Dysuria: Secondary | ICD-10-CM | POA: Diagnosis not present

## 2016-02-13 DIAGNOSIS — N3090 Cystitis, unspecified without hematuria: Secondary | ICD-10-CM | POA: Diagnosis not present

## 2016-02-13 MED ORDER — FLUCONAZOLE 150 MG PO TABS: 150.0000 mg | ORAL_TABLET | Freq: Once | ORAL | Status: DC

## 2016-02-13 MED ORDER — NITROFURANTOIN MONOHYD MACRO 100 MG PO CAPS: 100.0000 mg | ORAL_CAPSULE | Freq: Two times a day (BID) | ORAL | Status: DC

## 2016-02-13 NOTE — Progress Notes (Signed)
   BP 130/65 mmHg  Pulse 89  Temp(Src) 98.4 F (36.9 C)  Ht 5' 0.7" (1.542 m)  Wt 194 lb 9.6 oz (88.27 kg)  BMI 37.12 kg/m2  SpO2 97%  LMP  (LMP Unknown)   Subjective:    Patient ID: Christine Booth, female    DOB: Aug 02, 1956, 60 y.o.   MRN: [id number]  HPI: Christine Booth is a 60 y.o. female  Chief Complaint  Patient presents with  . Urinary Tract Infection    pt states she has had some blood in urine, pressure, burning with urination and urinary frequency. States symptoms started this morning.    Urinary Tract Infection  This is a new problem. The current episode started today. The problem has been unchanged. The quality of the pain is described as burning. There has been no fever. Associated symptoms include frequency, hematuria and urgency. Pertinent negatives include no chills. She has tried nothing for the symptoms. There is no history of catheterization or kidney stones.     Relevant past medical, surgical, family and social history reviewed and updated as indicated. Interim medical history since our last visit reviewed. Allergies and medications reviewed and updated.  Review of Systems  Constitutional: Negative for chills.  Genitourinary: Positive for urgency, frequency and hematuria.    Per HPI unless specifically indicated above     Objective:    BP 130/65 mmHg  Pulse 89  Temp(Src) 98.4 F (36.9 C)  Ht 5' 0.7" (1.542 m)  Wt 194 lb 9.6 oz (88.27 kg)  BMI 37.12 kg/m2  SpO2 97%  LMP  (LMP Unknown)  Wt Readings from Last 3 Encounters:  02/13/16 194 lb 9.6 oz (88.27 kg)  12/11/15 190 lb 3.2 oz (86.274 kg)  11/15/15 192 lb 9.6 oz (87.363 kg)    Physical Exam  Constitutional: She is oriented to person, place, and time. She appears well-developed and well-nourished. No distress.  HENT:  Head: Normocephalic and atraumatic.  Eyes: Conjunctivae and lids are normal. Right eye exhibits no discharge. Left eye exhibits no discharge. No scleral icterus.   Cardiovascular: Normal rate and regular rhythm.   Pulmonary/Chest: Effort normal. No respiratory distress.  Abdominal: Soft. Normal appearance and bowel sounds are normal. She exhibits no distension. There is no splenomegaly or hepatomegaly. There is no tenderness. There is no CVA tenderness.  Musculoskeletal: Normal range of motion.  Neurological: She is alert and oriented to person, place, and time.  Skin: Skin is intact. No rash noted. No pallor.  Psychiatric: She has a normal mood and affect. Her behavior is normal. Judgment and thought content normal.  Nursing note and vitals reviewed.  Urine positive Assessment & Plan:   Problem List Items Addressed This Visit    None    Visit Diagnoses    Burning with urination    -  Primary    Relevant Orders    UA/M w/rflx Culture, Routine    Cystitis        Rx for Macrobid        Follow up plan: No Follow-up on file.

## 2016-02-16 LAB — URINE CULTURE, REFLEX

## 2016-02-16 LAB — UA/M W/RFLX CULTURE, ROUTINE
Bilirubin, UA: NEGATIVE
Nitrite, UA: NEGATIVE
Protein, UA: NEGATIVE
Specific Gravity, UA: 1.025 (ref 1.005–1.030)
Urobilinogen, Ur: 0.2 mg/dL (ref 0.2–1.0)
pH, UA: 5.5 (ref 5.0–7.5)

## 2016-03-12 ENCOUNTER — Encounter: Payer: Self-pay | Admitting: Unknown Physician Specialty

## 2016-03-12 ENCOUNTER — Ambulatory Visit (INDEPENDENT_AMBULATORY_CARE_PROVIDER_SITE_OTHER): Payer: BLUE CROSS/BLUE SHIELD | Admitting: Unknown Physician Specialty

## 2016-03-12 VITALS — BP 152/88 | HR 74 | Temp 98.3°F | Ht 61.0 in | Wt 194.0 lb

## 2016-03-12 DIAGNOSIS — I129 Hypertensive chronic kidney disease with stage 1 through stage 4 chronic kidney disease, or unspecified chronic kidney disease: Secondary | ICD-10-CM | POA: Diagnosis not present

## 2016-03-12 DIAGNOSIS — E119 Type 2 diabetes mellitus without complications: Secondary | ICD-10-CM

## 2016-03-12 DIAGNOSIS — N189 Chronic kidney disease, unspecified: Secondary | ICD-10-CM

## 2016-03-12 DIAGNOSIS — E785 Hyperlipidemia, unspecified: Secondary | ICD-10-CM

## 2016-03-12 LAB — BAYER DCA HB A1C WAIVED: HB A1C (BAYER DCA - WAIVED): 8.4 % — ABNORMAL HIGH (ref <7.0)

## 2016-03-12 MED ORDER — CANAGLIFLOZIN 100 MG PO TABS: 100.0000 mg | ORAL_TABLET | Freq: Every day | ORAL | Status: DC

## 2016-03-12 NOTE — Assessment & Plan Note (Addendum)
BP high today, possible white coat HTN. Will recheck in 3 months.

## 2016-03-12 NOTE — Assessment & Plan Note (Deleted)
hgb A1c 8.4 today. Start Invokana 100mg  daily.

## 2016-03-12 NOTE — Assessment & Plan Note (Signed)
Stable, continue present medications.   

## 2016-03-12 NOTE — Progress Notes (Signed)
BP 152/88 mmHg  Pulse 74  Temp(Src) 98.3 F (36.8 C)  Ht 5\' 1"  (1.549 m)  Wt 194 lb (87.998 kg)  BMI 36.67 kg/m2  SpO2 99%  LMP  (LMP Unknown)   Subjective:    Patient ID: Christine Booth, female    DOB: 1956-07-23, 60 y.o.   MRN: [id number]  HPI: Christine Booth is a 60 y.o. female  Chief Complaint  Patient presents with  . Diabetes    pt states she knows she is due for an eye exam  . Hyperlipidemia  . Hypertension   Diabetes:  Using medications without difficulties No hypoglycemic episodes No hyperglycemic episodes Feet problems: denies Blood Sugars averaging: doesn't take everyday but usually around 160 in morning if she takes it Eye exam within last year: Patient is due now and plans on calling this week to get an appt   Hypertension:  Using medications without difficulty Average home BPs: occasionally, runs around 130/80   Using medication without problems or lightheadedness No chest pain with exertion or shortness of breath No Edema  Elevated Cholesterol: Using medications without problems No Muscle aches Diet compliance: Eats out a lot. Eats diet high in carbs, but watches salt intake.  Exercise: does zumba 3 days a week   Relevant past medical, surgical, family and social history reviewed and updated as indicated. Interim medical history since our last visit reviewed. Allergies and medications reviewed and updated.  Review of Systems  Constitutional: Negative.   HENT: Negative.   Eyes: Negative.   Respiratory: Negative.   Cardiovascular: Negative.   Gastrointestinal: Negative.   Endocrine: Negative.   Genitourinary: Negative.   Musculoskeletal: Negative.   Skin: Negative.   Allergic/Immunologic: Negative.   Neurological: Negative.   Hematological: Negative.   Psychiatric/Behavioral: Negative.     Per HPI unless specifically indicated above     Objective:    BP 152/88 mmHg  Pulse 74  Temp(Src) 98.3 F (36.8 C)  Ht 5\' 1"   (1.549 m)  Wt 194 lb (87.998 kg)  BMI 36.67 kg/m2  SpO2 99%  LMP  (LMP Unknown)  Wt Readings from Last 3 Encounters:  03/12/16 194 lb (87.998 kg)  02/13/16 194 lb 9.6 oz (88.27 kg)  12/11/15 190 lb 3.2 oz (86.274 kg)    Physical Exam  Constitutional: She is oriented to person, place, and time. She appears well-developed and well-nourished. No distress.  HENT:  Head: Normocephalic and atraumatic.  Eyes: Conjunctivae and lids are normal. Right eye exhibits no discharge. Left eye exhibits no discharge. No scleral icterus.  Neck: Normal range of motion. Neck supple. No JVD present. Carotid bruit is not present.  Cardiovascular: Normal rate, regular rhythm and normal heart sounds.   Pulmonary/Chest: Effort normal and breath sounds normal.  Abdominal: Normal appearance. There is no splenomegaly or hepatomegaly.  Musculoskeletal: Normal range of motion.  Neurological: She is alert and oriented to person, place, and time.  Skin: Skin is warm, dry and intact. No rash noted. No pallor.  Psychiatric: She has a normal mood and affect. Her behavior is normal. Judgment and thought content normal.        Assessment & Plan:   Problem List Items Addressed This Visit      Unprioritized   Benign hypertension with chronic kidney disease    BP high today, possible white coat HTN. Will recheck in 3 months.       Hyperlipidemia    Stable, continue present medications.  Other Visit Diagnoses    Type 2 diabetes mellitus without complication, without long-term current use of insulin ([place])    -  Primary    hgb A1c 8.4 today. Start Invokana 100mg  daily. Recheck 3 months.     Relevant Medications    canagliflozin (INVOKANA) 100 MG TABS tablet    Other Relevant Orders    Comprehensive metabolic panel    Bayer DCA Hb A1c Waived        Follow up plan: Return in about 3 months (around 06/11/2016).

## 2016-03-13 LAB — COMPREHENSIVE METABOLIC PANEL
ALT: 22 IU/L (ref 0–32)
AST: 28 IU/L (ref 0–40)
Albumin/Globulin Ratio: 1.8 (ref 1.2–2.2)
Albumin: 4.1 g/dL (ref 3.6–4.8)
Alkaline Phosphatase: 61 IU/L (ref 39–117)
BUN/Creatinine Ratio: 13 (ref 12–28)
BUN: 8 mg/dL (ref 8–27)
Bilirubin Total: 0.4 mg/dL (ref 0.0–1.2)
CO2: 22 mmol/L (ref 18–29)
Calcium: 9.5 mg/dL (ref 8.7–10.3)
Chloride: 102 mmol/L (ref 96–106)
Creatinine, Ser: 0.62 mg/dL (ref 0.57–1.00)
GFR calc Af Amer: 113 mL/min/{1.73_m2} (ref >59)
GFR calc non Af Amer: 98 mL/min/{1.73_m2} (ref >59)
Globulin, Total: 2.3 g/dL (ref 1.5–4.5)
Glucose: 163 mg/dL — ABNORMAL HIGH (ref 65–99)
Potassium: 3.7 mmol/L (ref 3.5–5.2)
Sodium: 142 mmol/L (ref 134–144)
Total Protein: 6.4 g/dL (ref 6.0–8.5)

## 2016-06-23 ENCOUNTER — Encounter: Payer: Self-pay | Admitting: Unknown Physician Specialty

## 2016-06-23 ENCOUNTER — Ambulatory Visit (INDEPENDENT_AMBULATORY_CARE_PROVIDER_SITE_OTHER): Payer: BLUE CROSS/BLUE SHIELD | Admitting: Unknown Physician Specialty

## 2016-06-23 VITALS — BP 128/75 | HR 74 | Temp 98.4°F | Ht 61.6 in | Wt 185.8 lb

## 2016-06-23 DIAGNOSIS — E785 Hyperlipidemia, unspecified: Secondary | ICD-10-CM | POA: Diagnosis not present

## 2016-06-23 DIAGNOSIS — I152 Hypertension secondary to endocrine disorders: Secondary | ICD-10-CM | POA: Insufficient documentation

## 2016-06-23 DIAGNOSIS — I129 Hypertensive chronic kidney disease with stage 1 through stage 4 chronic kidney disease, or unspecified chronic kidney disease: Secondary | ICD-10-CM | POA: Diagnosis not present

## 2016-06-23 DIAGNOSIS — E1122 Type 2 diabetes mellitus with diabetic chronic kidney disease: Secondary | ICD-10-CM

## 2016-06-23 DIAGNOSIS — I10 Essential (primary) hypertension: Secondary | ICD-10-CM | POA: Diagnosis not present

## 2016-06-23 DIAGNOSIS — E1159 Type 2 diabetes mellitus with other circulatory complications: Secondary | ICD-10-CM | POA: Insufficient documentation

## 2016-06-23 LAB — BAYER DCA HB A1C WAIVED: HB A1C (BAYER DCA - WAIVED): 7.4 % — ABNORMAL HIGH (ref <7.0)

## 2016-06-23 MED ORDER — LOSARTAN POTASSIUM-HCTZ 100-12.5 MG PO TABS: 1.0000 | ORAL_TABLET | Freq: Every day | ORAL | Status: DC

## 2016-06-23 MED ORDER — PRAVASTATIN SODIUM 40 MG PO TABS: 40.0000 mg | ORAL_TABLET | Freq: Every day | ORAL | Status: DC

## 2016-06-23 MED ORDER — METFORMIN HCL 500 MG PO TABS: 500.0000 mg | ORAL_TABLET | Freq: Four times a day (QID) | ORAL | Status: DC

## 2016-06-23 MED ORDER — CANAGLIFLOZIN 100 MG PO TABS: 100.0000 mg | ORAL_TABLET | Freq: Every day | ORAL | Status: DC

## 2016-06-23 MED ORDER — SAXAGLIPTIN HCL 5 MG PO TABS: 5.0000 mg | ORAL_TABLET | Freq: Every day | ORAL | Status: DC

## 2016-06-23 MED ORDER — PANTOPRAZOLE SODIUM 40 MG PO TBEC: 40.0000 mg | DELAYED_RELEASE_TABLET | Freq: Every day | ORAL | Status: DC

## 2016-06-23 NOTE — Progress Notes (Signed)
BP 128/75 mmHg  Pulse 74  Temp(Src) 98.4 F (36.9 C)  Ht 5' 1.6" (1.565 m)  Wt 185 lb 12.8 oz (84.278 kg)  BMI 34.41 kg/m2  SpO2 97%  LMP  (LMP Unknown)   Subjective:    Patient ID: Christine Booth, female    DOB: 29-Dec-1955, 60 y.o.   MRN: [id number]  HPI: [name] is a 60 y.o. female  Chief Complaint  Patient presents with  . Diabetes    pt states last eye exam in chart is correct   . Hyperlipidemia  . Hypertension   Diabetes: Last visit started Invokanna.  Tolerating well.  Lost some weight No hypoglycemic episodes No hyperglycemic episodes Feet problems:none Blood Sugars averaging:130-150 eye exam within last year Last Hgb A1C: 8.4  Hypertension  Using medications without difficulty Average home BPs "pretty good"  Using medication without problems or lightheadedness No chest pain with exertion or shortness of breath No Edema  Elevated Cholesterol Using medications without problems No Muscle aches  Diet: Trying to watch diet Exercise:Zumba 3 days/week  Relevant past medical, surgical, family and social history reviewed and updated as indicated. Interim medical history since our last visit reviewed. Allergies and medications reviewed and updated.  Review of Systems  Per HPI unless specifically indicated above     Objective:    BP 128/75 mmHg  Pulse 74  Temp(Src) 98.4 F (36.9 C)  Ht 5' 1.6" (1.565 m)  Wt 185 lb 12.8 oz (84.278 kg)  BMI 34.41 kg/m2  SpO2 97%  LMP  (LMP Unknown)  Wt Readings from Last 3 Encounters:  06/23/16 185 lb 12.8 oz (84.278 kg)  03/12/16 194 lb (87.998 kg)  02/13/16 194 lb 9.6 oz (88.27 kg)    Physical Exam  Constitutional: She is oriented to person, place, and time. She appears well-developed and well-nourished. No distress.  HENT:  Head: Normocephalic and atraumatic.  Eyes: Conjunctivae and lids are normal. Right eye exhibits no discharge. Left eye exhibits no discharge. No scleral icterus.  Neck:  Normal range of motion. Neck supple. No JVD present. Carotid bruit is not present.  Cardiovascular: Normal rate, regular rhythm and normal heart sounds.   Pulmonary/Chest: Effort normal and breath sounds normal.  Abdominal: Normal appearance. There is no splenomegaly or hepatomegaly.  Musculoskeletal: Normal range of motion.  Neurological: She is alert and oriented to person, place, and time.  Skin: Skin is warm, dry and intact. No rash noted. No pallor.  Psychiatric: She has a normal mood and affect. Her behavior is normal. Judgment and thought content normal.    Results for orders placed or performed in visit on 03/12/16  Comprehensive metabolic panel  Result Value Ref Range   Glucose 163 (H) 65 - 99 mg/dL   BUN 8 8 - 27 mg/dL   Creatinine, Ser 0.62 0.57 - 1.00 mg/dL   GFR calc non Af Amer 98 >59 mL/min/1.73   GFR calc Af Amer 113 >59 mL/min/1.73   BUN/Creatinine Ratio 13 12 - 28   Sodium 142 134 - 144 mmol/L   Potassium 3.7 3.5 - 5.2 mmol/L   Chloride 102 96 - 106 mmol/L   CO2 22 18 - 29 mmol/L   Calcium 9.5 8.7 - 10.3 mg/dL   Total Protein 6.4 6.0 - 8.5 g/dL   Albumin 4.1 3.6 - 4.8 g/dL   Globulin, Total 2.3 1.5 - 4.5 g/dL   Albumin/Globulin Ratio 1.8 1.2 - 2.2   Bilirubin Total 0.4 0.0 - 1.2 mg/dL  Alkaline Phosphatase 61 39 - 117 IU/L   AST 28 0 - 40 IU/L   ALT 22 0 - 32 IU/L  Bayer DCA Hb A1c Waived  Result Value Ref Range   Bayer DCA Hb A1c Waived 8.4 (H) <7.0 %      Assessment & Plan:   Problem List Items Addressed This Visit      Unprioritized   Diabetes type 2, controlled ([place] [facility]) - Primary    Hgb A1C down from 8.4 to 7.4.  Continue present treatment and recheck in 3 months      Relevant Medications   saxagliptin HCl (ONGLYZA) 5 MG TABS tablet   pravastatin (PRAVACHOL) 40 MG tablet   metFORMIN (GLUCOPHAGE) 500 MG tablet   losartan-hydrochlorothiazide (HYZAAR) 100-12.5 MG tablet   canagliflozin (INVOKANA) 100 MG TABS tablet   Other Relevant Orders   Bayer  DCA Hb A1c Waived   Comprehensive metabolic panel   Essential hypertension, benign    Stable, continue present medications.        Relevant Medications   pravastatin (PRAVACHOL) 40 MG tablet   losartan-hydrochlorothiazide (HYZAAR) 100-12.5 MG tablet   Hyperlipidemia    Await lipid panel      Relevant Medications   pravastatin (PRAVACHOL) 40 MG tablet   losartan-hydrochlorothiazide (HYZAAR) 100-12.5 MG tablet   Other Relevant Orders   Lipid Panel w/o Chol/HDL Ratio    Other Visit Diagnoses    Type 2 diabetes mellitus with chronic kidney disease, without long-term current use of insulin, unspecified CKD stage (HCC)        Relevant Medications    saxagliptin HCl (ONGLYZA) 5 MG TABS tablet    pravastatin (PRAVACHOL) 40 MG tablet    metFORMIN (GLUCOPHAGE) 500 MG tablet    losartan-hydrochlorothiazide (HYZAAR) 100-12.5 MG tablet    canagliflozin (INVOKANA) 100 MG TABS tablet    Hypertensive nephropathy, stage 1-4 or unspecified chronic kidney disease (HCC)        Relevant Medications    losartan-hydrochlorothiazide (HYZAAR) 100-12.5 MG tablet        Follow up plan: Return in about 3 months (around 09/23/2016).

## 2016-06-23 NOTE — Assessment & Plan Note (Signed)
Hgb A1C down from 8.4 to 7.4.  Continue present treatment and recheck in 3 months

## 2016-06-23 NOTE — Assessment & Plan Note (Signed)
Stable, continue present medications.   

## 2016-06-23 NOTE — Assessment & Plan Note (Signed)
Await lipid panel 

## 2016-06-24 LAB — COMPREHENSIVE METABOLIC PANEL
ALT: 14 IU/L (ref 0–32)
AST: 19 IU/L (ref 0–40)
Albumin/Globulin Ratio: 1.7 (ref 1.2–2.2)
Albumin: 4.3 g/dL (ref 3.6–4.8)
Alkaline Phosphatase: 69 IU/L (ref 39–117)
BUN/Creatinine Ratio: 20 (ref 12–28)
BUN: 13 mg/dL (ref 8–27)
Bilirubin Total: 0.3 mg/dL (ref 0.0–1.2)
CO2: 21 mmol/L (ref 18–29)
Calcium: 9.3 mg/dL (ref 8.7–10.3)
Chloride: 101 mmol/L (ref 96–106)
Creatinine, Ser: 0.66 mg/dL (ref 0.57–1.00)
GFR calc Af Amer: 110 mL/min/{1.73_m2} (ref >59)
GFR calc non Af Amer: 96 mL/min/{1.73_m2} (ref >59)
Globulin, Total: 2.5 g/dL (ref 1.5–4.5)
Glucose: 120 mg/dL — ABNORMAL HIGH (ref 65–99)
Potassium: 3.9 mmol/L (ref 3.5–5.2)
Sodium: 140 mmol/L (ref 134–144)
Total Protein: 6.8 g/dL (ref 6.0–8.5)

## 2016-06-24 LAB — LIPID PANEL W/O CHOL/HDL RATIO
Cholesterol, Total: 149 mg/dL (ref 100–199)
HDL: 43 mg/dL (ref >39)
LDL Calculated: 56 mg/dL (ref 0–99)
Triglycerides: 249 mg/dL — ABNORMAL HIGH (ref 0–149)
VLDL Cholesterol Cal: 50 mg/dL — ABNORMAL HIGH (ref 5–40)

## 2016-06-24 NOTE — Progress Notes (Signed)
Quick Note:  Normal labs. Pt notified through mychart ______ 

## 2016-07-01 ENCOUNTER — Other Ambulatory Visit: Payer: Self-pay | Admitting: Unknown Physician Specialty

## 2016-09-24 ENCOUNTER — Ambulatory Visit: Payer: BLUE CROSS/BLUE SHIELD | Admitting: Unknown Physician Specialty

## 2016-10-03 ENCOUNTER — Encounter: Payer: Self-pay | Admitting: Unknown Physician Specialty

## 2016-10-03 ENCOUNTER — Ambulatory Visit (INDEPENDENT_AMBULATORY_CARE_PROVIDER_SITE_OTHER): Payer: BLUE CROSS/BLUE SHIELD | Admitting: Unknown Physician Specialty

## 2016-10-03 VITALS — BP 143/83 | HR 69 | Temp 97.4°F | Ht 62.2 in | Wt 184.4 lb

## 2016-10-03 DIAGNOSIS — E1165 Type 2 diabetes mellitus with hyperglycemia: Secondary | ICD-10-CM | POA: Diagnosis not present

## 2016-10-03 DIAGNOSIS — I10 Essential (primary) hypertension: Secondary | ICD-10-CM

## 2016-10-03 DIAGNOSIS — E1122 Type 2 diabetes mellitus with diabetic chronic kidney disease: Secondary | ICD-10-CM | POA: Diagnosis not present

## 2016-10-03 DIAGNOSIS — Z23 Encounter for immunization: Secondary | ICD-10-CM | POA: Diagnosis not present

## 2016-10-03 LAB — BAYER DCA HB A1C WAIVED: HB A1C (BAYER DCA - WAIVED): 8.1 % — ABNORMAL HIGH (ref <7.0)

## 2016-10-03 MED ORDER — CANAGLIFLOZIN 300 MG PO TABS: 300.0000 mg | ORAL_TABLET | Freq: Every day | ORAL | 3 refills | Status: DC

## 2016-10-03 NOTE — Assessment & Plan Note (Signed)
Hgb A1C is 8.1.  Increase Invokanna to  300 mg.  Daily.  [facility]

## 2016-10-03 NOTE — Patient Instructions (Signed)

## 2016-10-03 NOTE — Assessment & Plan Note (Signed)
A little high here.  Good numbers at home

## 2016-10-03 NOTE — Progress Notes (Signed)
BP (!) 143/83 (BP Location: Left Arm, Cuff Size: Large)   Pulse 69   Temp 97.4 F (36.3 C)   Ht 5' 2.2" (1.58 m) Comment: pt had shoes on  Wt 184 lb 6.4 oz (83.6 kg) Comment: pt had shoes on  LMP  (LMP Unknown)   SpO2 96%   BMI 33.51 kg/m    Subjective:    Patient ID: Christine Booth, female    DOB: 11-27-1956, 60 y.o.   MRN: [id number]  HPI: Christine Booth is a 60 y.o. female  Chief Complaint  Patient presents with  . Diabetes  . Hyperlipidemia  . Hypertension   Diabetes: Using medications without difficulties No hypoglycemic episodes No hyperglycemic episodes Feet problems:none Blood Sugars averaging: 150 eye exam within last year Last Hgb A1C:  Hypertension  Using medications without difficulty Average home BPs130/70  Using medication without problems or lightheadedness No chest pain with exertion or shortness of breath No Edema  Elevated Cholesterol Using medications without problems No Muscle aches  Diet: none Exercise: Zumba  Relevant past medical, surgical, family and social history reviewed and updated as indicated. Interim medical history since our last visit reviewed. Allergies and medications reviewed and updated.  Review of Systems  Per HPI unless specifically indicated above     Objective:    BP (!) 143/83 (BP Location: Left Arm, Cuff Size: Large)   Pulse 69   Temp 97.4 F (36.3 C)   Ht 5' 2.2" (1.58 m) Comment: pt had shoes on  Wt 184 lb 6.4 oz (83.6 kg) Comment: pt had shoes on  LMP  (LMP Unknown)   SpO2 96%   BMI 33.51 kg/m   Wt Readings from Last 3 Encounters:  10/03/16 184 lb 6.4 oz (83.6 kg)  06/23/16 185 lb 12.8 oz (84.3 kg)  03/12/16 194 lb (88 kg)    Physical Exam  Constitutional: She is oriented to person, place, and time. She appears well-developed and well-nourished. No distress.  HENT:  Head: Normocephalic and atraumatic.  Eyes: Conjunctivae and lids are normal. Right eye exhibits no discharge. Left eye  exhibits no discharge. No scleral icterus.  Neck: Normal range of motion. Neck supple. No JVD present. Carotid bruit is not present.  Cardiovascular: Normal rate, regular rhythm and normal heart sounds.   Pulmonary/Chest: Effort normal and breath sounds normal.  Abdominal: Normal appearance. There is no splenomegaly or hepatomegaly.  Musculoskeletal: Normal range of motion.  Neurological: She is alert and oriented to person, place, and time.  Skin: Skin is warm, dry and intact. No rash noted. No pallor.  Psychiatric: She has a normal mood and affect. Her behavior is normal. Judgment and thought content normal.   Diabetic Foot Exam - Simple   Simple Foot Form Diabetic Foot exam was performed with the following findings:  Yes 10/03/2016  8:51 AM  Visual Inspection No deformities, no ulcerations, no other skin breakdown bilaterally:  Yes Sensation Testing Intact to touch and monofilament testing bilaterally:  Yes Pulse Check Posterior Tibialis and Dorsalis pulse intact bilaterally:  Yes Comments      Results for orders placed or performed in visit on 06/23/16  Lipid Panel w/o Chol/HDL Ratio  Result Value Ref Range   Cholesterol, Total 149 100 - 199 mg/dL   Triglycerides 249 (H) 0 - 149 mg/dL   HDL 43 >39 mg/dL   VLDL Cholesterol Cal 50 (H) 5 - 40 mg/dL   LDL Calculated 56 0 - 99 mg/dL  Bayer DCA Hb  A1c Waived  Result Value Ref Range   Bayer DCA Hb A1c Waived 7.4 (H) <7.0 %  Comprehensive metabolic panel  Result Value Ref Range   Glucose 120 (H) 65 - 99 mg/dL   BUN 13 8 - 27 mg/dL   Creatinine, Ser 0.66 0.57 - 1.00 mg/dL   GFR calc non Af Amer 96 >59 mL/min/1.73   GFR calc Af Amer 110 >59 mL/min/1.73   BUN/Creatinine Ratio 20 12 - 28   Sodium 140 134 - 144 mmol/L   Potassium 3.9 3.5 - 5.2 mmol/L   Chloride 101 96 - 106 mmol/L   CO2 21 18 - 29 mmol/L   Calcium 9.3 8.7 - 10.3 mg/dL   Total Protein 6.8 6.0 - 8.5 g/dL   Albumin 4.3 3.6 - 4.8 g/dL   Globulin, Total 2.5 1.5 -  4.5 g/dL   Albumin/Globulin Ratio 1.7 1.2 - 2.2   Bilirubin Total 0.3 0.0 - 1.2 mg/dL   Alkaline Phosphatase 69 39 - 117 IU/L   AST 19 0 - 40 IU/L   ALT 14 0 - 32 IU/L      Assessment & Plan:   Problem List Items Addressed This Visit      Unprioritized   Essential hypertension, benign    A little high here.  Good numbers at home      Uncontrolled type 2 diabetes mellitus (HCC)    Hgb A1C is 8.1.  Increase Invokanna to  300 mg.  Daily.  Refer to lifestyle center      Relevant Medications   canagliflozin (INVOKANA) 300 MG TABS tablet    Other Visit Diagnoses    Need for influenza vaccination    -  Primary   Relevant Orders   Flu Vaccine QUAD 36+ mos IM (Completed)       Follow up plan: Return in about 3 months (around 01/03/2017).

## 2016-10-04 LAB — COMPREHENSIVE METABOLIC PANEL
ALT: 17 IU/L (ref 0–32)
AST: 20 IU/L (ref 0–40)
Albumin/Globulin Ratio: 2 (ref 1.2–2.2)
Albumin: 4.3 g/dL (ref 3.6–4.8)
Alkaline Phosphatase: 68 IU/L (ref 39–117)
BUN/Creatinine Ratio: 17 (ref 12–28)
BUN: 12 mg/dL (ref 8–27)
Bilirubin Total: 0.3 mg/dL (ref 0.0–1.2)
CO2: 22 mmol/L (ref 18–29)
Calcium: 9.4 mg/dL (ref 8.7–10.3)
Chloride: 106 mmol/L (ref 96–106)
Creatinine, Ser: 0.71 mg/dL (ref 0.57–1.00)
GFR calc Af Amer: 106 mL/min/{1.73_m2} (ref >59)
GFR calc non Af Amer: 92 mL/min/{1.73_m2} (ref >59)
Globulin, Total: 2.2 g/dL (ref 1.5–4.5)
Glucose: 150 mg/dL — ABNORMAL HIGH (ref 65–99)
Potassium: 3.7 mmol/L (ref 3.5–5.2)
Sodium: 145 mmol/L — ABNORMAL HIGH (ref 134–144)
Total Protein: 6.5 g/dL (ref 6.0–8.5)

## 2016-10-06 NOTE — Progress Notes (Signed)
Normal labs.  Pt notified through mychart

## 2016-12-23 ENCOUNTER — Other Ambulatory Visit: Payer: Self-pay | Admitting: Unknown Physician Specialty

## 2016-12-23 DIAGNOSIS — E785 Hyperlipidemia, unspecified: Secondary | ICD-10-CM

## 2017-01-12 ENCOUNTER — Encounter: Payer: Self-pay | Admitting: Unknown Physician Specialty

## 2017-01-12 ENCOUNTER — Ambulatory Visit (INDEPENDENT_AMBULATORY_CARE_PROVIDER_SITE_OTHER): Payer: BLUE CROSS/BLUE SHIELD | Admitting: Unknown Physician Specialty

## 2017-01-12 VITALS — BP 116/73 | HR 81 | Temp 98.1°F | Ht 61.3 in | Wt 180.6 lb

## 2017-01-12 DIAGNOSIS — I129 Hypertensive chronic kidney disease with stage 1 through stage 4 chronic kidney disease, or unspecified chronic kidney disease: Secondary | ICD-10-CM | POA: Diagnosis not present

## 2017-01-12 DIAGNOSIS — E1122 Type 2 diabetes mellitus with diabetic chronic kidney disease: Secondary | ICD-10-CM | POA: Diagnosis not present

## 2017-01-12 DIAGNOSIS — E785 Hyperlipidemia, unspecified: Secondary | ICD-10-CM | POA: Diagnosis not present

## 2017-01-12 DIAGNOSIS — I10 Essential (primary) hypertension: Secondary | ICD-10-CM | POA: Diagnosis not present

## 2017-01-12 DIAGNOSIS — Z0000 Encounter for general adult medical examination without abnormal findings: Secondary | ICD-10-CM

## 2017-01-12 DIAGNOSIS — E1165 Type 2 diabetes mellitus with hyperglycemia: Secondary | ICD-10-CM

## 2017-01-12 LAB — MICROALBUMIN, URINE WAIVED
Creatinine, Urine Waived: 200 mg/dL (ref 10–300)
Microalb, Ur Waived: 10 mg/L (ref 0–19)
Microalb/Creat Ratio: <30 mg/g (ref <30)

## 2017-01-12 LAB — BAYER DCA HB A1C WAIVED: HB A1C (BAYER DCA - WAIVED): 7.5 % — ABNORMAL HIGH (ref <7.0)

## 2017-01-12 MED ORDER — CANAGLIFLOZIN 300 MG PO TABS: 300.0000 mg | ORAL_TABLET | Freq: Every day | ORAL | 3 refills | Status: DC

## 2017-01-12 MED ORDER — METFORMIN HCL 500 MG PO TABS: 1000.0000 mg | ORAL_TABLET | Freq: Two times a day (BID) | ORAL | 1 refills | Status: DC

## 2017-01-12 MED ORDER — GLUCOSE BLOOD VI STRP: ORAL_STRIP | 12 refills | Status: AC

## 2017-01-12 MED ORDER — PANTOPRAZOLE SODIUM 40 MG PO TBEC: 40.0000 mg | DELAYED_RELEASE_TABLET | Freq: Every day | ORAL | 1 refills | Status: DC

## 2017-01-12 MED ORDER — SAXAGLIPTIN HCL 5 MG PO TABS: 5.0000 mg | ORAL_TABLET | Freq: Every day | ORAL | 1 refills | Status: DC

## 2017-01-12 MED ORDER — FLUTICASONE PROPIONATE 50 MCG/ACT NA SUSP: 2.0000 | Freq: Every day | NASAL | 12 refills | Status: DC

## 2017-01-12 MED ORDER — PRAVASTATIN SODIUM 40 MG PO TABS: 40.0000 mg | ORAL_TABLET | Freq: Every day | ORAL | 1 refills | Status: DC

## 2017-01-12 MED ORDER — LOSARTAN POTASSIUM-HCTZ 100-12.5 MG PO TABS: 1.0000 | ORAL_TABLET | Freq: Every day | ORAL | 1 refills | Status: DC

## 2017-01-12 NOTE — Assessment & Plan Note (Signed)
Improved at 7.5% down for 8.1%.  Recheck in 3 months and continue lifestyle changes.

## 2017-01-12 NOTE — Patient Instructions (Signed)
Please do call to schedule your mammogram; the number to schedule one at either [facility] or [facility] is [phone]   

## 2017-01-12 NOTE — Progress Notes (Signed)
BP 116/73 (BP Location: Left Arm, Patient Position: Sitting, Cuff Size: Large)   Pulse 81   Temp 98.1 F (36.7 C)   Ht 5' 1.3" (1.557 m)   Wt 180 lb 9.6 oz (81.9 kg)   LMP  (LMP Unknown)   SpO2 97%   BMI 33.79 kg/m    Subjective:    Patient ID: Christine Booth, female    DOB: Apr 19, 1956, 61 y.o.   MRN: [id number]  HPI: Christine Booth is a 61 y.o. female  Chief Complaint  Patient presents with  . Annual Exam  . Diabetes    pt states eye exam was 2 to 3 months ago, will fax form to [facility] in [place]   Diabetes: Using medications without difficulties No hypoglycemic episodes No hyperglycemic episodes Feet problems: none Blood Sugars averaging: 170 but ran out of strips eye exam within last year Last Hgb A1C: 8.1  Hypertension  Using medications without difficulty Average home BPs "It;s been good"  Using medication without problems or lightheadedness No chest pain with exertion or shortness of breath No Edema  Elevated Cholesterol Using medications without problems No Muscle aches  Diet: "I've been trying" and cut out sodas Exercise: Going to [employer]  Relevant past medical, surgical, family and social history reviewed and updated as indicated. Interim medical history since our last visit reviewed. Allergies and medications reviewed and updated.  Review of Systems  Per HPI unless specifically indicated above     Objective:    BP 116/73 (BP Location: Left Arm, Patient Position: Sitting, Cuff Size: Large)   Pulse 81   Temp 98.1 F (36.7 C)   Ht 5' 1.3" (1.557 m)   Wt 180 lb 9.6 oz (81.9 kg)   LMP  (LMP Unknown)   SpO2 97%   BMI 33.79 kg/m   Wt Readings from Last 3 Encounters:  01/12/17 180 lb 9.6 oz (81.9 kg)  10/03/16 184 lb 6.4 oz (83.6 kg)  06/23/16 185 lb 12.8 oz (84.3 kg)    Physical Exam  Constitutional: She is oriented to person, place, and time. She appears well-developed and well-nourished.  HENT:  Head: Normocephalic  and atraumatic.  Eyes: Pupils are equal, round, and reactive to light. Right eye exhibits no discharge. Left eye exhibits no discharge. No scleral icterus.  Neck: Normal range of motion. Neck supple. Carotid bruit is not present. No thyromegaly present.  Cardiovascular: Normal rate, regular rhythm and normal heart sounds.  Exam reveals no gallop and no friction rub.   No murmur heard. Pulmonary/Chest: Effort normal and breath sounds normal. No respiratory distress. She has no wheezes. She has no rales.  Abdominal: Soft. Bowel sounds are normal. There is no tenderness. There is no rebound.  Genitourinary: Vagina normal and uterus normal. No breast swelling, tenderness or discharge. Cervix exhibits no motion tenderness, no discharge and no friability. Right adnexum displays no mass, no tenderness and no fullness. Left adnexum displays no mass, no tenderness and no fullness.  Musculoskeletal: Normal range of motion.  Lymphadenopathy:    She has no cervical adenopathy.  Neurological: She is alert and oriented to person, place, and time.  Skin: Skin is warm, dry and intact. No rash noted.  Psychiatric: She has a normal mood and affect. Her speech is normal and behavior is normal. Judgment and thought content normal. Cognition and memory are normal.   Assessment & Plan:   Problem List Items Addressed This Visit      Unprioritized  Essential hypertension, benign    Stable, continue present medications.        Relevant Medications   pravastatin (PRAVACHOL) 40 MG tablet   losartan-hydrochlorothiazide (HYZAAR) 100-12.5 MG tablet   Other Relevant Orders   Uric acid   Microalbumin, Urine Waived   Hyperlipidemia   Relevant Medications   pravastatin (PRAVACHOL) 40 MG tablet   losartan-hydrochlorothiazide (HYZAAR) 100-12.5 MG tablet   Other Relevant Orders   Lipid Panel w/o Chol/HDL Ratio   Uncontrolled type 2 diabetes mellitus ([place]) - Primary    Improved at 7.5% down for 8.1%.  Recheck in 3  months and continue lifestyle changes.        Relevant Medications   canagliflozin (INVOKANA) 300 MG TABS tablet   metFORMIN (GLUCOPHAGE) 500 MG tablet   pravastatin (PRAVACHOL) 40 MG tablet   saxagliptin HCl (ONGLYZA) 5 MG TABS tablet   losartan-hydrochlorothiazide (HYZAAR) 100-12.5 MG tablet   Other Relevant Orders   Comprehensive metabolic panel    Other Visit Diagnoses    Routine general medical examination at a health care facility       Relevant Orders   Comprehensive metabolic panel   TSH   Bayer [place] Hb A1c Waived   MM DIGITAL SCREENING BILATERAL   Ambulatory referral to Gastroenterology   IGP, Aptima HPV, rfx 16/18,45   Hypertensive nephropathy, stage 1-4 or unspecified chronic kidney disease       Relevant Medications   losartan-hydrochlorothiazide (HYZAAR) 100-12.5 MG tablet       Follow up plan: Return in about 3 months (around 04/11/2017).

## 2017-01-12 NOTE — Assessment & Plan Note (Signed)
Stable, continue present medications.   

## 2017-01-13 LAB — COMPREHENSIVE METABOLIC PANEL
ALT: 13 IU/L (ref 0–32)
AST: 15 IU/L (ref 0–40)
Albumin/Globulin Ratio: 1.9 (ref 1.2–2.2)
Albumin: 4.2 g/dL (ref 3.6–4.8)
Alkaline Phosphatase: 74 IU/L (ref 39–117)
BUN/Creatinine Ratio: 19 (ref 12–28)
BUN: 16 mg/dL (ref 8–27)
Bilirubin Total: <0.2 mg/dL (ref 0.0–1.2)
CO2: 22 mmol/L (ref 18–29)
Calcium: 9.5 mg/dL (ref 8.7–10.3)
Chloride: 101 mmol/L (ref 96–106)
Creatinine, Ser: 0.83 mg/dL (ref 0.57–1.00)
GFR calc Af Amer: 87 mL/min/{1.73_m2} (ref >59)
GFR calc non Af Amer: 76 mL/min/{1.73_m2} (ref >59)
Globulin, Total: 2.2 g/dL (ref 1.5–4.5)
Glucose: 138 mg/dL — ABNORMAL HIGH (ref 65–99)
Potassium: 3.8 mmol/L (ref 3.5–5.2)
Sodium: 141 mmol/L (ref 134–144)
Total Protein: 6.4 g/dL (ref 6.0–8.5)

## 2017-01-13 LAB — LIPID PANEL W/O CHOL/HDL RATIO
Cholesterol, Total: 143 mg/dL (ref 100–199)
HDL: 47 mg/dL (ref >39)
LDL Calculated: 66 mg/dL (ref 0–99)
Triglycerides: 152 mg/dL — ABNORMAL HIGH (ref 0–149)
VLDL Cholesterol Cal: 30 mg/dL (ref 5–40)

## 2017-01-13 LAB — URIC ACID: Uric Acid: 2.8 mg/dL (ref 2.5–7.1)

## 2017-01-13 LAB — TSH: TSH: 3.69 u[IU]/mL (ref 0.450–4.500)

## 2017-01-13 NOTE — Progress Notes (Signed)
Normal labs.  Pt notified through mychart

## 2017-01-14 ENCOUNTER — Other Ambulatory Visit: Payer: Self-pay

## 2017-01-14 ENCOUNTER — Telehealth: Payer: Self-pay

## 2017-01-14 NOTE — Telephone Encounter (Signed)
Gastroenterology Pre-Procedure Review  Request Date:  Requesting Physician: Dr.   PATIENT REVIEW QUESTIONS: The patient responded to the following health history questions as indicated:    1. Are you having any GI issues? no 2. Do you have a personal history of Polyps? no 3. Do you have a family history of Colon Cancer or Polyps? no 4. Diabetes Mellitus? yes (Type 2) 5. Joint replacements in the past 12 months?no 6. Major health problems in the past 3 months?no 7. Any artificial heart valves, MVP, or defibrillator?no    MEDICATIONS & ALLERGIES:    Patient reports the following regarding taking any anticoagulation/antiplatelet therapy:   Plavix, Coumadin, Eliquis, Xarelto, Lovenox, Pradaxa, Brilinta, or Effient? no Aspirin? yes (ASA 81mg )  Patient confirms/reports the following medications:  Current Outpatient Prescriptions  Medication Sig Dispense Refill  . aspirin EC 81 MG tablet Take 81 mg by mouth daily.    . Biotin 300 MCG TABS Take 1 tablet by mouth daily.    . canagliflozin (INVOKANA) 300 MG TABS tablet Take 1 tablet (300 mg total) by mouth daily before breakfast. 30 tablet 3  . cetirizine (ZYRTEC) 10 MG tablet Take 10 mg by mouth daily.    . fluticasone (FLONASE) 50 MCG/ACT nasal spray Place 2 sprays into both nostrils daily. 16 g 12  . glucosamine-chondroitin 500-400 MG tablet Take 1 tablet by mouth daily.     Marland Kitchen glucose blood test strip Use as instructed 100 each 12  . losartan-hydrochlorothiazide (HYZAAR) 100-12.5 MG tablet Take 1 tablet by mouth daily. 90 tablet 1  . metFORMIN (GLUCOPHAGE) 500 MG tablet Take 2 tablets (1,000 mg total) by mouth 2 (two) times daily with a meal. 180 tablet 1  . pantoprazole (PROTONIX) 40 MG tablet Take 1 tablet (40 mg total) by mouth daily. 90 tablet 1  . pravastatin (PRAVACHOL) 40 MG tablet Take 1 tablet (40 mg total) by mouth at bedtime. 90 tablet 1  . Probiotic Product (PROBIOTIC PO) Take by mouth daily.    . saxagliptin HCl (ONGLYZA) 5  MG TABS tablet Take 1 tablet (5 mg total) by mouth daily. 90 tablet 1   No current facility-administered medications for this visit.     Patient confirms/reports the following allergies:  No Known Allergies  No orders of the defined types were placed in this encounter.   AUTHORIZATION INFORMATION Primary Insurance: 1D#: Group #:  Secondary Insurance: 1D#: Group #:  SCHEDULE INFORMATION: Date: 02/06/17 Time: Location: [place]

## 2017-01-15 LAB — IGP, APTIMA HPV, RFX 16/18,45
HPV Aptima: NEGATIVE
PAP Smear Comment: 0

## 2017-02-03 ENCOUNTER — Telehealth: Payer: Self-pay | Admitting: Unknown Physician Specialty

## 2017-02-03 NOTE — Telephone Encounter (Signed)
Pt called to verify that Christine Booth received a prior authorization request from her insurance company via [facility] for colonoscopy prep prescription.  Pt states that [facility] was supposed to fax a prior authorization on 02/02/17.  Pt would like someone to call [facility] for additional information as she is scheduled to have the colonoscopy this Friday.  Please call patient to advise.

## 2017-02-03 NOTE — Telephone Encounter (Signed)
Called and let patient know that PA has been submitted. I let her know that I would call her and let her know once we receive a determination.

## 2017-02-03 NOTE — Telephone Encounter (Signed)
PA form received and PA submitted with cover my meds. Will call patient and let her know.

## 2017-02-03 NOTE — Telephone Encounter (Signed)
Called and spoke with Angie at [facility]. We have not received the PA form from them and Angie stated that she would fax it to [place] now. Will start PA as soon as I receive the form.

## 2017-02-04 NOTE — Telephone Encounter (Signed)
Called and let patient know that prep kit was denied by insurance. I let her know that I have tried contacting [employer] to see if there is something we could do for her. Patient stated that she needs to have this by 5 pm tomorrow. She also stated that she was going to stop by the pharmacy and see how much the prep kit was anyway. She stated that if she could not afford it, then she would have to just put off the colonoscopy.

## 2017-02-04 NOTE — Telephone Encounter (Signed)
PA denied by patient's insurance. I have tried to contact [employer] to see if there is something we can do for the patient to have her colonoscopy on Friday. Have not heard anything yet. Will call patient and let her know.

## 2017-02-05 ENCOUNTER — Encounter: Payer: Self-pay | Admitting: *Deleted

## 2017-02-05 ENCOUNTER — Other Ambulatory Visit: Payer: Self-pay

## 2017-02-05 MED ORDER — PEG 3350-KCL-NABCB-NACL-NASULF 236 G PO SOLR: ORAL | 0 refills | Status: DC

## 2017-02-05 NOTE — Telephone Encounter (Signed)
New medication was sent in by GI office for patient.

## 2017-02-06 ENCOUNTER — Ambulatory Visit: Payer: BLUE CROSS/BLUE SHIELD | Admitting: Anesthesiology

## 2017-02-06 ENCOUNTER — Encounter
Admission: RE | Disposition: A | Discharge status: Home / Self Care | Payer: Self-pay | Source: Ambulatory Visit | Attending: Gastroenterology

## 2017-02-06 ENCOUNTER — Encounter: Payer: Self-pay | Admitting: Anesthesiology

## 2017-02-06 ENCOUNTER — Ambulatory Visit
Admission: RE | Admit: 2017-02-06 | Discharge: 2017-02-06 | Disposition: A | Discharge status: Home / Self Care | Payer: BLUE CROSS/BLUE SHIELD | Source: Ambulatory Visit | Attending: Gastroenterology | Admitting: Gastroenterology

## 2017-02-06 DIAGNOSIS — D123 Benign neoplasm of transverse colon: Secondary | ICD-10-CM

## 2017-02-06 DIAGNOSIS — E785 Hyperlipidemia, unspecified: Secondary | ICD-10-CM | POA: Insufficient documentation

## 2017-02-06 DIAGNOSIS — L719 Rosacea, unspecified: Secondary | ICD-10-CM | POA: Diagnosis not present

## 2017-02-06 DIAGNOSIS — D122 Benign neoplasm of ascending colon: Secondary | ICD-10-CM

## 2017-02-06 DIAGNOSIS — Z79899 Other long term (current) drug therapy: Secondary | ICD-10-CM | POA: Diagnosis not present

## 2017-02-06 DIAGNOSIS — K219 Gastro-esophageal reflux disease without esophagitis: Secondary | ICD-10-CM | POA: Insufficient documentation

## 2017-02-06 DIAGNOSIS — E1122 Type 2 diabetes mellitus with diabetic chronic kidney disease: Secondary | ICD-10-CM | POA: Diagnosis not present

## 2017-02-06 DIAGNOSIS — N189 Chronic kidney disease, unspecified: Secondary | ICD-10-CM | POA: Insufficient documentation

## 2017-02-06 DIAGNOSIS — I129 Hypertensive chronic kidney disease with stage 1 through stage 4 chronic kidney disease, or unspecified chronic kidney disease: Secondary | ICD-10-CM | POA: Insufficient documentation

## 2017-02-06 DIAGNOSIS — D120 Benign neoplasm of cecum: Secondary | ICD-10-CM | POA: Diagnosis not present

## 2017-02-06 DIAGNOSIS — Z7984 Long term (current) use of oral hypoglycemic drugs: Secondary | ICD-10-CM | POA: Diagnosis not present

## 2017-02-06 DIAGNOSIS — Z1211 Encounter for screening for malignant neoplasm of colon: Secondary | ICD-10-CM

## 2017-02-06 DIAGNOSIS — K5730 Diverticulosis of large intestine without perforation or abscess without bleeding: Secondary | ICD-10-CM | POA: Diagnosis not present

## 2017-02-06 DIAGNOSIS — K640 First degree hemorrhoids: Secondary | ICD-10-CM

## 2017-02-06 DIAGNOSIS — Z7982 Long term (current) use of aspirin: Secondary | ICD-10-CM | POA: Diagnosis not present

## 2017-02-06 HISTORY — PX: COLONOSCOPY WITH PROPOFOL: SHX5780

## 2017-02-06 LAB — GLUCOSE, CAPILLARY: Glucose-Capillary: 137 mg/dL — ABNORMAL HIGH (ref 65–99)

## 2017-02-06 SURGERY — COLONOSCOPY WITH PROPOFOL
Anesthesia: General

## 2017-02-06 MED ORDER — SODIUM CHLORIDE 0.9 % IV SOLN
INTRAVENOUS | Status: DC
  Administered 2017-02-06: 1000 mL via INTRAVENOUS

## 2017-02-06 MED ORDER — PROPOFOL 10 MG/ML IV BOLUS
INTRAVENOUS | Status: AC
  Filled 2017-02-06: qty 20

## 2017-02-06 MED ORDER — PROPOFOL 500 MG/50ML IV EMUL
INTRAVENOUS | Status: AC
  Filled 2017-02-06: qty 50

## 2017-02-06 MED ORDER — PROPOFOL 10 MG/ML IV BOLUS
INTRAVENOUS | Status: DC | PRN
  Administered 2017-02-06: 70 mg via INTRAVENOUS

## 2017-02-06 MED ORDER — PROPOFOL 500 MG/50ML IV EMUL
INTRAVENOUS | Status: DC | PRN
  Administered 2017-02-06: 150 ug/kg/min via INTRAVENOUS

## 2017-02-06 NOTE — Transfer of Care (Signed)
Immediate Anesthesia Transfer of Care Note  Patient: Christine Booth  Procedure(s) Performed: Procedure(s): COLONOSCOPY WITH PROPOFOL (N/A)  Patient Location: PACU  Anesthesia Type:General  Level of Consciousness: awake, alert  and oriented  Airway & Oxygen Therapy: Patient Spontanous Breathing and Patient connected to nasal cannula oxygen  Post-op Assessment: Report given to RN and Post -op Vital signs reviewed and stable  Post vital signs: Reviewed and stable  Last Vitals:  Vitals:   02/06/17 0810 02/06/17 0924  BP: 135/76 (!) 106/52  Pulse: 85 71  Resp: 20 16  Temp: 36.9 C (!) 36 C    Last Pain:  Vitals:   02/06/17 0924  TempSrc: Tympanic         Complications: No apparent anesthesia complications

## 2017-02-06 NOTE — H&P (Signed)
[place] Christine Booth, Christine Booth [address] Phone: [phone] Fax : [phone]  Primary Care Physician:  Christine Haddock, NP Primary Gastroenterologist:  Dr. Jonathon Bellows   Pre-Procedure History & Physical: HPI:  Christine Booth is a 61 y.o. female is here for an colonoscopy.   Past Medical History:  Diagnosis Date  . Allergy   . Chronic kidney disease stage 1  . Diabetes mellitus without complication ([place])   . Gastrointestinal disorder   . GERD (gastroesophageal reflux disease)   . Heart murmur   . Hyperlipidemia   . Hypertension   . Rosacea     Past Surgical History:  Procedure Laterality Date  . TUBAL LIGATION  1985    Prior to Admission medications   Medication Sig Start Date End Date Taking? Authorizing Provider  aspirin EC 81 MG tablet Take 81 mg by mouth daily.   Yes Historical Provider, MD  Biotin 300 MCG TABS Take 1 tablet by mouth daily.   Yes Historical Provider, MD  canagliflozin (INVOKANA) 300 MG TABS tablet Take 1 tablet (300 mg total) by mouth daily before breakfast. 01/12/17  Yes Christine Haddock, NP  cetirizine (ZYRTEC) 10 MG tablet Take 10 mg by mouth daily.   Yes Historical Provider, MD  fluticasone (FLONASE) 50 MCG/ACT nasal spray Place 2 sprays into both nostrils daily. 01/12/17  Yes Christine Haddock, NP  glucosamine-chondroitin 500-400 MG tablet Take 1 tablet by mouth daily.    Yes Historical Provider, MD  losartan-hydrochlorothiazide (HYZAAR) 100-12.5 MG tablet Take 1 tablet by mouth daily. 01/12/17  Yes Christine Haddock, NP  metFORMIN (GLUCOPHAGE) 500 MG tablet Take 2 tablets (1,000 mg total) by mouth 2 (two) times daily with a meal. 01/12/17  Yes Christine Haddock, NP  pantoprazole (PROTONIX) 40 MG tablet Take 1 tablet (40 mg total) by mouth daily. 01/12/17  Yes Christine Haddock, NP  polyethylene glycol (GOLYTELY) 236 g solution Drink one 8 oz glass every 20 mins until stools are clear 02/05/17  Yes Jonathon Bellows, MD  pravastatin (PRAVACHOL) 40 MG tablet  Take 1 tablet (40 mg total) by mouth at bedtime. 01/12/17  Yes Christine Haddock, NP  Probiotic Product (PROBIOTIC PO) Take by mouth daily.   Yes Historical Provider, MD  saxagliptin HCl (ONGLYZA) 5 MG TABS tablet Take 1 tablet (5 mg total) by mouth daily. 01/12/17  Yes Christine Haddock, NP  glucose blood test strip Use as instructed 01/12/17   Christine Haddock, NP    Allergies as of 01/14/2017  . (No Known Allergies)    Family History  Problem Relation Age of Onset  . Arthritis Mother   . Cancer Mother     breast  . Heart disease Mother   . Cancer Father     bladder  . Heart disease Father   . Hypertension Father     Social History   Social History  . Marital status: Divorced    Spouse name: N/A  . Number of children: N/A  . Years of education: N/A   Occupational History  . Not on file.   Social History Main Topics  . Smoking status: Never Smoker  . Smokeless tobacco: Never Used  . Alcohol use No  . Drug use: No  . Sexual activity: No   Other Topics Concern  . Not on file   Social History Narrative  . No narrative on file    Review of Systems: See HPI, otherwise negative ROS  Physical Exam: BP 135/76   Pulse  85   Temp 98.5 F (36.9 C) (Tympanic)   Resp 20   Ht 5\' 2"  (0000000 m)   Wt 170 lb (77.1 kg)   LMP  (LMP Unknown)   SpO2 99%   BMI 31.09 kg/m  General:   Alert,  pleasant and cooperative in NAD Head:  Normocephalic and atraumatic. Neck:  Supple; no masses or thyromegaly. Lungs:  Clear throughout to auscultation.    Heart:  Regular rate and rhythm. Abdomen:  Soft, nontender and nondistended. Normal bowel sounds, without guarding, and without rebound.   Neurologic:  Alert and  oriented x4;  grossly normal neurologically.  Impression/Plan: Christine Booth is here for an colonoscopy to be performed for Screening colonoscopy average risk    Risks, benefits, limitations, and alternatives regarding  colonoscopy have been reviewed with the patient.  Questions  have been answered.  All parties agreeable.   Jonathon Bellows, MD  02/06/2017, 8:37 AM

## 2017-02-06 NOTE — Op Note (Signed)
[facility] Patient Name: Christine Booth Procedure Date: 02/06/2017 8:46 AM MRN: [id number] Account #: [id number] Date of Birth: 14-Nov-1956 Admit Type: Outpatient Age: 61 Room: [facility] ENDO ROOM 4 Gender: Female Note Status: Finalized Procedure:            Colonoscopy Indications:          Screening for colorectal malignant neoplasm Providers:            Jonathon Bellows MD, MD Referring MD:         Kathrine Haddock (Referring MD) Medicines:            Monitored Anesthesia Care Complications:        No immediate complications. Procedure:            Pre-Anesthesia Assessment:                       - Prior to the procedure, a History and Physical was                        performed, and patient medications, allergies and                        sensitivities were reviewed. The patient's tolerance of                        previous anesthesia was reviewed.                       - ASA Grade Assessment: III - A patient with severe                        systemic disease.                       After obtaining informed consent, the colonoscope was                        passed under direct vision. Throughout the procedure,                        the patient's blood pressure, pulse, and oxygen                        saturations were monitored continuously. The Olympus                        CF-H180AL colonoscope ( S#: [id number] ) was introduced                        through the anus and advanced to the the cecum,                        identified by the appendiceal orifice, IC valve and                        transillumination. The colonoscopy was somewhat                        difficult due to inadequate bowel prep. The patient  tolerated the procedure well. The quality of the bowel                        preparation was fair. Findings:      The perianal and digital rectal examinations were normal.      Non-bleeding internal hemorrhoids were  found during retroflexion. The       hemorrhoids were small and Grade I (internal hemorrhoids that do not       prolapse).      Multiple medium-mouthed diverticula were found in the entire colon.      A 5 mm polyp was found in the cecum. The polyp was sessile. The polyp       was removed with a cold biopsy forceps. Resection and retrieval were       complete.      A 10 mm polyp was found in the ascending colon. The polyp was       semi-sessile. The polyp was removed with a piecemeal technique using a       hot snare. Resection and retrieval were complete.      Three sessile polyps were found in the ascending colon. The polyps were       5 to 8 mm in size. These polyps were removed with a cold snare.       Resection and retrieval were complete.      A 5 mm polyp was found in the transverse colon. The polyp was sessile.       The polyp was removed with a cold snare. Resection and retrieval were       complete.      The exam was otherwise without abnormality on direct and retroflexion       views. Impression:           - Preparation of the colon was fair.                       - Non-bleeding internal hemorrhoids.                       - Diverticulosis in the entire examined colon.                       - One 5 mm polyp in the cecum, removed with a cold                        biopsy forceps. Resected and retrieved.                       - One 10 mm polyp in the ascending colon, removed                        piecemeal using a hot snare. Resected and retrieved.                       - Three 5 to 8 mm polyps in the ascending colon,                        removed with a cold snare. Resected and retrieved.                       - One 5 mm polyp in the transverse colon,  removed with                        a cold snare. Resected and retrieved.                       - The examination was otherwise normal on direct and                        retroflexion views. Recommendation:       - Discharge  patient to home (with escort).                       - Resume previous diet today.                       - Continue present medications.                       - Await pathology results.                       - Repeat colonoscopy in 4 months for surveillance after                        piecemeal polypectomy.                       - Suggest two day prep due to inadequate prep today Procedure Code(s):    --- Professional ---                       [phone], Colonoscopy, flexible; with removal of tumor(s),                        polyp(s), or other lesion(s) by snare technique                       45380, 35, Colonoscopy, flexible; with biopsy, single                        or multiple Diagnosis Code(s):    --- Professional ---                       Z12.11, Encounter for screening for malignant neoplasm                        of colon                       K64.0, First degree hemorrhoids                       D12.0, Benign neoplasm of cecum                       D12.2, Benign neoplasm of ascending colon                       D12.3, Benign neoplasm of transverse colon (hepatic                        flexure or splenic flexure)  K57.30, Diverticulosis of large intestine without                        perforation or abscess without bleeding CPT copyright 2016 American Medical Association. All rights reserved. The codes documented in this report are preliminary and upon coder review may  be revised to meet current compliance requirements. Jonathon Bellows, MD Jonathon Bellows MD, MD 02/06/2017 9:25:54 AM This report has been signed electronically. Number of Addenda: 0 Note Initiated On: 02/06/2017 8:46 AM Scope Withdrawal Time: 0 hours 26 minutes 16 seconds  Total Procedure Duration: 0 hours 29 minutes 51 seconds       [facility]

## 2017-02-06 NOTE — Anesthesia Post-op Follow-up Note (Cosign Needed)
Anesthesia QCDR form completed.        

## 2017-02-06 NOTE — Anesthesia Preprocedure Evaluation (Signed)
Anesthesia Evaluation  Patient identified by MRN, date of birth, ID band Patient awake    Reviewed: Allergy & Precautions, NPO status , Patient's Chart, lab work & pertinent test results, reviewed documented beta blocker date and time   Airway Mallampati: II  TM Distance: >3 FB     Dental  (+) Chipped   Pulmonary           Cardiovascular hypertension, Pt. on medications      Neuro/Psych    GI/Hepatic GERD  ,  Endo/Other  diabetes, Type 2  Renal/GU Renal InsufficiencyRenal disease     Musculoskeletal   Abdominal   Peds  Hematology   Anesthesia Other Findings   Reproductive/Obstetrics                             Anesthesia Physical Anesthesia Plan  ASA: III  Anesthesia Plan: General   Post-op Pain Management:    Induction: Intravenous  Airway Management Planned: Natural Airway  Additional Equipment:   Intra-op Plan:   Post-operative Plan:   Informed Consent: I have reviewed the patients History and Physical, chart, labs and discussed the procedure including the risks, benefits and alternatives for the proposed anesthesia with the patient or authorized representative who has indicated his/her understanding and acceptance.     Plan Discussed with: CRNA  Anesthesia Plan Comments:         Anesthesia Quick Evaluation

## 2017-02-06 NOTE — Anesthesia Postprocedure Evaluation (Signed)
Anesthesia Post Note  Patient: Christine Booth  Procedure(s) Performed: Procedure(s) (LRB): COLONOSCOPY WITH PROPOFOL (N/A)  Patient location during evaluation: Endoscopy Anesthesia Type: General Level of consciousness: awake and alert Pain management: pain level controlled Vital Signs Assessment: post-procedure vital signs reviewed and stable Respiratory status: spontaneous breathing, nonlabored ventilation, respiratory function stable and patient connected to nasal cannula oxygen Cardiovascular status: blood pressure returned to baseline and stable Postop Assessment: no signs of nausea or vomiting Anesthetic complications: no     Last Vitals:  Vitals:   02/06/17 0944 02/06/17 0954  BP: 135/86 140/77  Pulse: 68 65  Resp: 16 17  Temp:      Last Pain:  Vitals:   02/06/17 0924  TempSrc: Tympanic                 Jacole Capley S

## 2017-02-09 ENCOUNTER — Encounter: Payer: Self-pay | Admitting: Gastroenterology

## 2017-02-09 LAB — SURGICAL PATHOLOGY

## 2017-03-09 ENCOUNTER — Other Ambulatory Visit: Payer: Self-pay | Admitting: Unknown Physician Specialty

## 2017-03-09 DIAGNOSIS — E1122 Type 2 diabetes mellitus with diabetic chronic kidney disease: Secondary | ICD-10-CM

## 2017-03-27 ENCOUNTER — Other Ambulatory Visit: Payer: Self-pay | Admitting: Unknown Physician Specialty

## 2017-03-31 ENCOUNTER — Ambulatory Visit (INDEPENDENT_AMBULATORY_CARE_PROVIDER_SITE_OTHER): Payer: BLUE CROSS/BLUE SHIELD | Admitting: Unknown Physician Specialty

## 2017-03-31 ENCOUNTER — Encounter: Payer: Self-pay | Admitting: Unknown Physician Specialty

## 2017-03-31 VITALS — BP 118/73 | HR 83 | Temp 98.5°F | Wt 178.0 lb

## 2017-03-31 DIAGNOSIS — K5732 Diverticulitis of large intestine without perforation or abscess without bleeding: Secondary | ICD-10-CM

## 2017-03-31 DIAGNOSIS — K5900 Constipation, unspecified: Secondary | ICD-10-CM | POA: Diagnosis not present

## 2017-03-31 DIAGNOSIS — R1030 Lower abdominal pain, unspecified: Secondary | ICD-10-CM

## 2017-03-31 LAB — CBC WITH DIFFERENTIAL/PLATELET
Hematocrit: 41.6 % (ref 34.0–46.6)
Hemoglobin: 13.9 g/dL (ref 11.1–15.9)
Lymphocytes Absolute: 1.9 10*3/uL (ref 0.7–3.1)
Lymphs: 18 %
MCH: 29.1 pg (ref 26.6–33.0)
MCHC: 33.4 g/dL (ref 31.5–35.7)
MCV: 87 fL (ref 79–97)
MID (Absolute): 0.7 10*3/uL (ref 0.1–1.6)
MID: 7 %
Neutrophils Absolute: 8.4 10*3/uL — ABNORMAL HIGH (ref 1.4–7.0)
Neutrophils: 76 %
Platelets: 301 10*3/uL (ref 150–379)
RBC: 4.78 x10E6/uL (ref 3.77–5.28)
RDW: 14.9 % (ref 12.3–15.4)
WBC: 11 10*3/uL — ABNORMAL HIGH (ref 3.4–10.8)

## 2017-03-31 LAB — UA/M W/RFLX CULTURE, ROUTINE
Bilirubin, UA: NEGATIVE
Ketones, UA: NEGATIVE
Leukocytes, UA: NEGATIVE
Nitrite, UA: NEGATIVE
Protein, UA: NEGATIVE
RBC, UA: NEGATIVE
Specific Gravity, UA: 1.02 (ref 1.005–1.030)
Urobilinogen, Ur: 0.2 mg/dL (ref 0.2–1.0)
pH, UA: 5 (ref 5.0–7.5)

## 2017-03-31 LAB — MICROSCOPIC EXAMINATION: Bacteria, UA: NONE SEEN

## 2017-03-31 MED ORDER — CIPROFLOXACIN HCL 500 MG PO TABS: 500.0000 mg | ORAL_TABLET | Freq: Two times a day (BID) | ORAL | 0 refills | Status: DC

## 2017-03-31 MED ORDER — METRONIDAZOLE 500 MG PO TABS: 500.0000 mg | ORAL_TABLET | Freq: Two times a day (BID) | ORAL | 0 refills | Status: DC

## 2017-03-31 NOTE — Progress Notes (Signed)
BP 118/73   Pulse 83   Temp 98.5 F (36.9 C)   Wt 178 lb (80.7 kg)   LMP  (LMP Unknown)   SpO2 98%   BMI 32.56 kg/m    Subjective:    Patient ID: Christine Booth, female    DOB: 1956/10/14, 61 y.o.   MRN: [id number]  HPI: Christine Booth is a 60 y.o. female  Chief Complaint  Patient presents with  . Abdominal Pain    pt states she has had abdominal pain twice within the last 2 weeks. States the first episode was upper abdominal pain and the episode last night was lower abdominal pain. states the pain goes away but she still feels pressure.    Pt states she is having lower abdominal discomfort for 2 weeks and severe colicky pain last night  with difficulty having a BM.  Feeling pressure.  States it is painful when she walks.  States she was nauseated but no vomiting or diarrhea.   She does suffer a history of constipation.  She is lately trying to use fiber.   Noted Diverticuloses of colon on review of recent colonoscopy.  Multiple polyps removed.     No vaginal discharge or pain with intercourse.     Relevant past medical, surgical, family and social history reviewed and updated as indicated. Interim medical history since our last visit reviewed. Allergies and medications reviewed and updated.  Review of Systems  Per HPI unless specifically indicated above     Objective:    BP 118/73   Pulse 83   Temp 98.5 F (36.9 C)   Wt 178 lb (80.7 kg)   LMP  (LMP Unknown)   SpO2 98%   BMI 32.56 kg/m   Wt Readings from Last 3 Encounters:  03/31/17 178 lb (80.7 kg)  02/06/17 170 lb (77.1 kg)  01/12/17 180 lb 9.6 oz (81.9 kg)    Physical Exam  Constitutional: She is oriented to person, place, and time. She appears well-developed and well-nourished. No distress.  HENT:  Head: Normocephalic and atraumatic.  Eyes: Conjunctivae and lids are normal. Right eye exhibits no discharge. Left eye exhibits no discharge. No scleral icterus.  Neck: Normal range of motion. Neck  supple. No JVD present. Carotid bruit is not present.  Cardiovascular: Normal rate, regular rhythm and normal heart sounds.   Pulmonary/Chest: Effort normal and breath sounds normal.  Abdominal: Normal appearance. There is no hepatosplenomegaly, splenomegaly or hepatomegaly. There is tenderness in the right lower quadrant and left lower quadrant. There is no rigidity, no rebound, no guarding and no CVA tenderness.  Musculoskeletal: Normal range of motion.  Neurological: She is alert and oriented to person, place, and time.  Skin: Skin is warm, dry and intact. No rash noted. No pallor.  Psychiatric: She has a normal mood and affect. Her behavior is normal. Judgment and thought content normal.   CBC with mild elevations Urine is negative  Results for orders placed or performed during the hospital encounter of 02/06/17  Glucose, capillary  Result Value Ref Range   Glucose-Capillary 137 (H) 65 - 99 mg/dL  Surgical pathology  Result Value Ref Range   SURGICAL PATHOLOGY      Surgical Pathology CASE: [id number] PATIENT: [name] Surgical Pathology Report     SPECIMEN SUBMITTED: A. Colon polyp, cecum; cbx B. Colon polyp, cecum; hot snare C. Colon polyp x3, ascending; cold snare D. Colon polyp, transverse; cold snare  CLINICAL HISTORY: None provided  PRE-OPERATIVE DIAGNOSIS:  Screening Z12.11  POST-OPERATIVE DIAGNOSIS: Colon polyps; diverticulosis     DIAGNOSIS: A. COLON POLYP, CECUM; COLD BIOPSY: - TUBULAR ADENOMA. - NEGATIVE FOR HIGH-GRADE DYSPLASIA AND MALIGNANCY.  B. COLON POLYP, CECUM; HOT SNARE: - TUBULAR ADENOMA. - NEGATIVE FOR HIGH-GRADE DYSPLASIA AND MALIGNANCY.  C.  COLON POLYP 3, ASCENDING; COLD SNARE: - TUBULAR ADENOMAS (3). - NEGATIVE FOR HIGH-GRADE DYSPLASIA AND MALIGNANCY.  D.  COLON POLYP, TRANSVERSE; COLD SNARE: - VILLOUS ADENOMA. - NEGATIVE FOR HIGH-GRADE DYSPLASIA AND MALIGNANCY.   GROSS DESCRIPTION:  A. Labeled: BX polyp  cecum  Tissue fragment(s): 1  Size: 0.25 cm  Description: t an fragment  Entirely submitted in 1 cassette(s).   B. Labeled: hot snare polyp cecum  Tissue fragment(s): multiple  Size: aggregate, 1.1 x 0.5 x 0.1 cm  Description: tan fragments and fecal material  Entirely submitted in 1 cassette(s).   C. Labeled: cold snare polyp ascending colon 3  Tissue fragment(s): multiple  Size: aggregate, 1.8 x 1.0 x 0.1 cm  Description: tan fragments and fecal and mucoid material  Entirely submitted in 1 cassette(s).   D. Labeled: cold snare polyp transverse colon  Tissue fragment(s): multiple  Size: 0.2 x 0.2 x 0.1 cm  Description: minute pink fragment and fecal material  Entirely submitted in 1 cassette(s).    Final Diagnosis performed by Delorse Lek, MD.  Electronically signed 02/09/2017 11:05:27AM    The electronic signature indicates that the named Attending Pathologist has evaluated the specimen  Technical component performed at [facility], [address] [place], [place] [address] Lab: [phone] Dir: Darrick Penna. Evette Doffing, MD  Professional component performed at [facility], [facility], [address], [place], [place] [address] Lab: [phone] Dir: Dellia Nims. Rubinas, MD       Assessment & Plan:   Problem List Items Addressed This Visit    None    Visit Diagnoses    Lower abdominal pain    -  Primary   Relevant Orders   UA/M w/rflx Culture, Routine   CBC With Differential/Platelet   Diverticulitis of large intestine without bleeding, unspecified complication status       New problem.  Will start Cipro and Flagyl 500 mg BID for 7 days. Clear liquid diet until pain resolved    Relevant Medications   ciprofloxacin (CIPRO) 500 MG tablet   metroNIDAZOLE (FLAGYL) 500 MG tablet   Constipation, unspecified constipation type       discussed using Miralax.  recent colonoscopy       Follow up plan: Return if symptoms worsen or fail to  improve, for and for regular f/u early next month.

## 2017-03-31 NOTE — Patient Instructions (Addendum)

## 2017-04-08 ENCOUNTER — Encounter: Payer: Self-pay | Admitting: Unknown Physician Specialty

## 2017-04-10 ENCOUNTER — Encounter: Payer: Self-pay | Admitting: Unknown Physician Specialty

## 2017-04-10 ENCOUNTER — Ambulatory Visit (INDEPENDENT_AMBULATORY_CARE_PROVIDER_SITE_OTHER): Payer: BLUE CROSS/BLUE SHIELD | Admitting: Unknown Physician Specialty

## 2017-04-10 VITALS — BP 107/70 | HR 82 | Temp 98.5°F | Wt 170.4 lb

## 2017-04-10 DIAGNOSIS — R197 Diarrhea, unspecified: Secondary | ICD-10-CM | POA: Diagnosis not present

## 2017-04-10 DIAGNOSIS — E1122 Type 2 diabetes mellitus with diabetic chronic kidney disease: Secondary | ICD-10-CM

## 2017-04-10 DIAGNOSIS — E1165 Type 2 diabetes mellitus with hyperglycemia: Secondary | ICD-10-CM

## 2017-04-10 DIAGNOSIS — M546 Pain in thoracic spine: Secondary | ICD-10-CM

## 2017-04-10 DIAGNOSIS — R5383 Other fatigue: Secondary | ICD-10-CM | POA: Diagnosis not present

## 2017-04-10 LAB — UA/M W/RFLX CULTURE, ROUTINE
Bilirubin, UA: NEGATIVE
Ketones, UA: NEGATIVE
Leukocytes, UA: NEGATIVE
Nitrite, UA: NEGATIVE
Protein, UA: NEGATIVE
RBC, UA: NEGATIVE
Specific Gravity, UA: 1.015 (ref 1.005–1.030)
Urobilinogen, Ur: 0.2 mg/dL (ref 0.2–1.0)
pH, UA: 5.5 (ref 5.0–7.5)

## 2017-04-10 LAB — CBC WITH DIFFERENTIAL/PLATELET
Hematocrit: 42.4 % (ref 34.0–46.6)
Hemoglobin: 14.7 g/dL (ref 11.1–15.9)
Lymphocytes Absolute: 2.3 10*3/uL (ref 0.7–3.1)
Lymphs: 32 %
MCH: 29.1 pg (ref 26.6–33.0)
MCHC: 34.7 g/dL (ref 31.5–35.7)
MCV: 84 fL (ref 79–97)
MID (Absolute): 0.7 10*3/uL (ref 0.1–1.6)
MID: 10 %
Neutrophils Absolute: 4.2 10*3/uL (ref 1.4–7.0)
Neutrophils: 57 %
Platelets: 363 10*3/uL (ref 150–379)
RBC: 5.05 x10E6/uL (ref 3.77–5.28)
RDW: 14.4 % (ref 12.3–15.4)
WBC: 7.2 10*3/uL (ref 3.4–10.8)

## 2017-04-10 LAB — BAYER DCA HB A1C WAIVED: HB A1C (BAYER DCA - WAIVED): 6.9 % (ref <7.0)

## 2017-04-10 MED ORDER — CANAGLIFLOZIN 100 MG PO TABS: 100.0000 mg | ORAL_TABLET | Freq: Every day | ORAL | 1 refills | Status: DC

## 2017-04-10 NOTE — Patient Instructions (Addendum)
Stop Metformin until Diarrhea is better.    Start Invokanna 100 mg and DC the 300 mg.    Get spine x-ray  Stool for C-diff

## 2017-04-10 NOTE — Progress Notes (Addendum)
BP 107/70   Pulse 82   Temp 98.5 F (36.9 C)   Wt 170 lb 6.4 oz (77.3 kg)   LMP  (LMP Unknown)   SpO2 98%   BMI 31.17 kg/m    Subjective:    Patient ID: Christine Booth, female    DOB: 1956-04-13, 61 y.o.   MRN: [id number]  HPI: Christine Booth is a 61 y.o. female  Chief Complaint  Patient presents with  . Follow-up    pt states she is here for a f/up from diverticulitis visit a few weeks ago. Pt states that ever since she has taken the antibiotics, she has had loose bowel movements and no energy. She states this is getting a little better though.    Pt with complaints of diarrhea, back pain, and fatigue following her antibiotics.  Pt states her diverticulitis is "gone" but primary problem is pain in the middle of back.  Stooling is frequent with 3 episodes in the AM and 2-3 times during the rest of the day.  She is eating better but very fatigued.  She has lost 8 pounds since last visit.    Relevant past medical, surgical, family and social history reviewed and updated as indicated. Interim medical history since our last visit reviewed. Allergies and medications reviewed and updated.  Review of Systems  Per HPI unless specifically indicated above     Objective:    BP 107/70   Pulse 82   Temp 98.5 F (36.9 C)   Wt 170 lb 6.4 oz (77.3 kg)   LMP  (LMP Unknown)   SpO2 98%   BMI 31.17 kg/m   Wt Readings from Last 3 Encounters:  04/10/17 170 lb 6.4 oz (77.3 kg)  03/31/17 178 lb (80.7 kg)  02/06/17 170 lb (77.1 kg)    Physical Exam  Constitutional: She is oriented to person, place, and time. She appears well-developed and well-nourished. No distress.  HENT:  Head: Normocephalic and atraumatic.  Eyes: Conjunctivae and lids are normal. Right eye exhibits no discharge. Left eye exhibits no discharge. No scleral icterus.  Neck: Normal range of motion. Neck supple. No JVD present. Carotid bruit is not present.  Cardiovascular: Normal rate, regular rhythm and  normal heart sounds.   Pulmonary/Chest: Effort normal and breath sounds normal.  Abdominal: Normal appearance. There is no splenomegaly or hepatomegaly.  Musculoskeletal: Normal range of motion.  Neurological: She is alert and oriented to person, place, and time.  Skin: Skin is warm, dry and intact. No rash noted. No pallor.  Psychiatric: She has a normal mood and affect. Her behavior is normal. Judgment and thought content normal.   CBC is normal.  Urine is normal but 3 plus glucose on Invokanna  Results for orders placed or performed in visit on 03/31/17  Microscopic Examination  Result Value Ref Range   WBC, UA 0-5 0 - 5 /hpf   RBC, UA 0-2 0 - 2 /hpf   Epithelial Cells (non renal) 0-10 0 - 10 /hpf   Bacteria, UA None seen None seen/Few  UA/M w/rflx Culture, Routine  Result Value Ref Range   Specific Gravity, UA 1.020 1.005 - 1.030   pH, UA 5.0 5.0 - 7.5   Color, UA Yellow Yellow   Appearance Ur Clear Clear   Leukocytes, UA Negative Negative   Protein, UA Negative Negative/Trace   Glucose, UA 3+ (A) Negative   Ketones, UA Negative Negative   RBC, UA Negative Negative   Bilirubin, UA Negative  Negative   Urobilinogen, Ur 0.2 0.2 - 1.0 mg/dL   Nitrite, UA Negative Negative   Microscopic Examination See below:   CBC With Differential/Platelet  Result Value Ref Range   WBC 11.0 (H) 3.4 - 10.8 x10E3/uL   RBC 4.78 3.77 - 5.28 x10E6/uL   Hemoglobin 13.9 11.1 - 15.9 g/dL   Hematocrit 41.6 34.0 - 46.6 %   MCV 87 79 - 97 fL   MCH 29.1 26.6 - 33.0 pg   MCHC 33.4 31.5 - 35.7 g/dL   RDW 14.9 12.3 - 15.4 %   Platelets 301 150 - 379 x10E3/uL   Neutrophils 76 Not Estab. %   Lymphs 18 Not Estab. %   MID 7 Not Estab. %   Neutrophils Absolute 8.4 (H) 1.4 - 7.0 x10E3/uL   Lymphocytes Absolute 1.9 0.7 - 3.1 x10E3/uL   MID (Absolute) 0.7 0.1 - 1.6 X10E3/uL      Assessment & Plan:   Problem List Items Addressed This Visit      Unprioritized   Uncontrolled type 2 diabetes mellitus  ([place])    Work on fluids.  Cut back on Invokanna to 100 mg as she is feeling dehydrated.  Restart Metformin when diarrhea is improved.  Today, Hgb A1C is 6.9      Relevant Medications   canagliflozin (INVOKANA) 100 MG TABS tablet   Other Relevant Orders   Bayer DCA Hb A1c Waived    Other Visit Diagnoses    Diarrhea, unspecified type    -  Primary   Stop Metformin temporarily.  Check C diff   Relevant Orders   CBC With Differential/Platelet   Other fatigue       Relevant Orders   Comprehensive metabolic panel   Left-sided thoracic back pain, unspecified chronicity       Relevant Orders   UA/M w/rflx Culture, Routine   DG Thoracic Spine W/Swimmers       Follow up plan: Return in about 3 months (around 07/11/2017) for or see next week if worsening or no improvement.

## 2017-04-10 NOTE — Assessment & Plan Note (Addendum)
Work on fluids.  Cut back on Invokanna to 100 mg as she is feeling dehydrated.  Restart Metformin when diarrhea is improved.  Today, Hgb A1C is 6.9

## 2017-04-11 LAB — COMPREHENSIVE METABOLIC PANEL
ALT: 12 IU/L (ref 0–32)
AST: 13 IU/L (ref 0–40)
Albumin/Globulin Ratio: 1.9 (ref 1.2–2.2)
Albumin: 4.4 g/dL (ref 3.6–4.8)
Alkaline Phosphatase: 65 IU/L (ref 39–117)
BUN/Creatinine Ratio: 19 (ref 12–28)
BUN: 16 mg/dL (ref 8–27)
Bilirubin Total: 0.3 mg/dL (ref 0.0–1.2)
CO2: 25 mmol/L (ref 18–29)
Calcium: 9.5 mg/dL (ref 8.7–10.3)
Chloride: 96 mmol/L (ref 96–106)
Creatinine, Ser: 0.85 mg/dL (ref 0.57–1.00)
GFR calc Af Amer: 85 mL/min/{1.73_m2} (ref >59)
GFR calc non Af Amer: 74 mL/min/{1.73_m2} (ref >59)
Globulin, Total: 2.3 g/dL (ref 1.5–4.5)
Glucose: 130 mg/dL — ABNORMAL HIGH (ref 65–99)
Potassium: 3.8 mmol/L (ref 3.5–5.2)
Sodium: 137 mmol/L (ref 134–144)
Total Protein: 6.7 g/dL (ref 6.0–8.5)

## 2017-04-13 ENCOUNTER — Ambulatory Visit
Admission: RE | Admit: 2017-04-13 | Discharge: 2017-04-13 | Disposition: A | Discharge status: Home / Self Care | Payer: BLUE CROSS/BLUE SHIELD | Source: Ambulatory Visit | Attending: Unknown Physician Specialty | Admitting: Unknown Physician Specialty

## 2017-04-13 ENCOUNTER — Other Ambulatory Visit: Payer: Self-pay | Admitting: Unknown Physician Specialty

## 2017-04-13 ENCOUNTER — Other Ambulatory Visit: Payer: BLUE CROSS/BLUE SHIELD

## 2017-04-13 DIAGNOSIS — M5134 Other intervertebral disc degeneration, thoracic region: Secondary | ICD-10-CM | POA: Insufficient documentation

## 2017-04-13 DIAGNOSIS — M546 Pain in thoracic spine: Secondary | ICD-10-CM

## 2017-04-13 DIAGNOSIS — R197 Diarrhea, unspecified: Secondary | ICD-10-CM

## 2017-04-15 ENCOUNTER — Ambulatory Visit: Payer: BLUE CROSS/BLUE SHIELD | Admitting: Unknown Physician Specialty

## 2017-04-15 LAB — CLOSTRIDIUM DIFFICILE EIA: C difficile Toxins A+B, EIA: NEGATIVE

## 2017-07-15 ENCOUNTER — Encounter: Payer: Self-pay | Admitting: Unknown Physician Specialty

## 2017-07-15 ENCOUNTER — Ambulatory Visit (INDEPENDENT_AMBULATORY_CARE_PROVIDER_SITE_OTHER): Payer: BLUE CROSS/BLUE SHIELD | Admitting: Unknown Physician Specialty

## 2017-07-15 VITALS — BP 120/77 | HR 74 | Temp 98.0°F | Ht 61.6 in | Wt 172.6 lb

## 2017-07-15 DIAGNOSIS — E785 Hyperlipidemia, unspecified: Secondary | ICD-10-CM | POA: Diagnosis not present

## 2017-07-15 DIAGNOSIS — E119 Type 2 diabetes mellitus without complications: Secondary | ICD-10-CM | POA: Diagnosis not present

## 2017-07-15 DIAGNOSIS — E1165 Type 2 diabetes mellitus with hyperglycemia: Secondary | ICD-10-CM | POA: Diagnosis not present

## 2017-07-15 DIAGNOSIS — I10 Essential (primary) hypertension: Secondary | ICD-10-CM | POA: Diagnosis not present

## 2017-07-15 DIAGNOSIS — E1122 Type 2 diabetes mellitus with diabetic chronic kidney disease: Secondary | ICD-10-CM | POA: Diagnosis not present

## 2017-07-15 LAB — BAYER DCA HB A1C WAIVED: HB A1C (BAYER DCA - WAIVED): 7.3 % — ABNORMAL HIGH (ref <7.0)

## 2017-07-15 NOTE — Assessment & Plan Note (Signed)
Stable, continue present medications.   

## 2017-07-15 NOTE — Progress Notes (Signed)
BP 120/77   Pulse 74   Temp 98 F (36.7 C)   Ht 5' 1.6" (1.565 m)   Wt 172 lb 9.6 oz (78.3 kg)   LMP  (LMP Unknown)   SpO2 98%   BMI 31.98 kg/m    Subjective:    Patient ID: Christine Booth, female    DOB: 1956-01-30, 61 y.o.   MRN: [id number]  HPI: Christine Booth is a 61 y.o. female  Chief Complaint  Patient presents with  . Diabetes    eye exam form faxed to [facility] [place] at patient's last visit, will call and find out when patient's last exam was  . Hyperlipidemia  . Hypertension   Diabetes: Intolerance to Metformin due to GI symptoms.  Continuing Invokanna and Onglyza No hypoglycemic episodes No hyperglycemic episodes Feet problems:none Blood Sugars averaging:150-160 eye exam within last year Last Hgb A1C: 6.9%  Hypertension  Using medications without difficulty Average home BPs   Using medication without problems or lightheadedness No chest pain with exertion or shortness of breath No Edema  Elevated Cholesterol Using medications without problems No Muscle aches  Diet: Eating a well balanced diet Exercise: Goes to the gym 3-4 times/week.  A lot of yard work  Relevant past medical, surgical, family and social history reviewed and updated as indicated. Interim medical history since our last visit reviewed. Allergies and medications reviewed and updated.  Review of Systems  Per HPI unless specifically indicated above     Objective:    BP 120/77   Pulse 74   Temp 98 F (36.7 C)   Ht 5' 1.6" (1.565 m)   Wt 172 lb 9.6 oz (78.3 kg)   LMP  (LMP Unknown)   SpO2 98%   BMI 31.98 kg/m   Wt Readings from Last 3 Encounters:  07/15/17 172 lb 9.6 oz (78.3 kg)  04/10/17 170 lb 6.4 oz (77.3 kg)  03/31/17 178 lb (80.7 kg)    Physical Exam  Constitutional: She is oriented to person, place, and time. She appears well-developed and well-nourished. No distress.  HENT:  Head: Normocephalic and atraumatic.  Eyes: Conjunctivae and lids are normal.  Right eye exhibits no discharge. Left eye exhibits no discharge. No scleral icterus.  Neck: Normal range of motion. Neck supple. No JVD present. Carotid bruit is not present.  Cardiovascular: Normal rate, regular rhythm and normal heart sounds.   Pulmonary/Chest: Effort normal and breath sounds normal.  Abdominal: Normal appearance. There is no splenomegaly or hepatomegaly.  Musculoskeletal: Normal range of motion.  Neurological: She is alert and oriented to person, place, and time.  Skin: Skin is warm, dry and intact. No rash noted. No pallor.  Psychiatric: She has a normal mood and affect. Her behavior is normal. Judgment and thought content normal.    Results for orders placed or performed in visit on 04/13/17  Stool C-Diff Toxin Assay  Result Value Ref Range   C difficile Toxins A+B, EIA Negative Negative      Assessment & Plan:   Problem List Items Addressed This Visit      Unprioritized   Essential hypertension, benign - Primary    Stable, continue present medications.        Hyperlipidemia    Stable, continue present medications.        Uncontrolled type 2 diabetes mellitus (HCC)    Hgb A1C is 7.3%.  She would like to continue diet and exercise and be off all of medications.  Other Visit Diagnoses    Controlled type 2 diabetes mellitus without complication, without long-term current use of insulin ([place])       Relevant Orders   Comprehensive metabolic panel   Bayer DCA Hb A1c Waived       Follow up plan: Return in about 3 months (around 10/15/2017).

## 2017-07-15 NOTE — Assessment & Plan Note (Signed)
Hgb A1C is 7.3%.  She would like to continue diet and exercise and be off all of medications.

## 2017-07-16 LAB — COMPREHENSIVE METABOLIC PANEL
ALT: 16 IU/L (ref 0–32)
AST: 21 IU/L (ref 0–40)
Albumin/Globulin Ratio: 2.4 — ABNORMAL HIGH (ref 1.2–2.2)
Albumin: 4.8 g/dL (ref 3.6–4.8)
Alkaline Phosphatase: 67 IU/L (ref 39–117)
BUN/Creatinine Ratio: 17 (ref 12–28)
BUN: 13 mg/dL (ref 8–27)
Bilirubin Total: 0.5 mg/dL (ref 0.0–1.2)
CO2: 22 mmol/L (ref 20–29)
Calcium: 9.6 mg/dL (ref 8.7–10.3)
Chloride: 101 mmol/L (ref 96–106)
Creatinine, Ser: 0.78 mg/dL (ref 0.57–1.00)
GFR calc Af Amer: 94 mL/min/{1.73_m2} (ref >59)
GFR calc non Af Amer: 82 mL/min/{1.73_m2} (ref >59)
Globulin, Total: 2 g/dL (ref 1.5–4.5)
Glucose: 133 mg/dL — ABNORMAL HIGH (ref 65–99)
Potassium: 3.7 mmol/L (ref 3.5–5.2)
Sodium: 138 mmol/L (ref 134–144)
Total Protein: 6.8 g/dL (ref 6.0–8.5)

## 2017-09-18 ENCOUNTER — Other Ambulatory Visit: Payer: Self-pay | Admitting: Unknown Physician Specialty

## 2017-09-18 DIAGNOSIS — I129 Hypertensive chronic kidney disease with stage 1 through stage 4 chronic kidney disease, or unspecified chronic kidney disease: Secondary | ICD-10-CM

## 2017-09-21 ENCOUNTER — Other Ambulatory Visit: Payer: Self-pay | Admitting: Unknown Physician Specialty

## 2017-09-21 DIAGNOSIS — I129 Hypertensive chronic kidney disease with stage 1 through stage 4 chronic kidney disease, or unspecified chronic kidney disease: Secondary | ICD-10-CM

## 2017-10-01 ENCOUNTER — Other Ambulatory Visit: Payer: Self-pay | Admitting: Unknown Physician Specialty

## 2017-10-24 ENCOUNTER — Other Ambulatory Visit: Payer: Self-pay | Admitting: Unknown Physician Specialty

## 2017-11-10 ENCOUNTER — Telehealth: Payer: Self-pay | Admitting: Unknown Physician Specialty

## 2017-11-10 NOTE — Telephone Encounter (Signed)
Copied from [place] [id number]. Topic: Quick Communication - See Telephone Encounter >> Nov 10, 2017 12:26 PM Hewitt Shorts wrote: CRM for notification. See Telephone encounter for: pt is needing a refill on her osartin needs to be sent to [facility]  Best number [phone]  11/10/17.

## 2017-11-11 ENCOUNTER — Other Ambulatory Visit: Payer: Self-pay | Admitting: *Deleted

## 2017-11-11 DIAGNOSIS — I129 Hypertensive chronic kidney disease with stage 1 through stage 4 chronic kidney disease, or unspecified chronic kidney disease: Secondary | ICD-10-CM

## 2017-11-11 MED ORDER — LOSARTAN POTASSIUM-HCTZ 100-12.5 MG PO TABS: 1.0000 | ORAL_TABLET | Freq: Every day | ORAL | 1 refills | Status: DC

## 2017-11-11 NOTE — Telephone Encounter (Signed)
Pt  Calling for  Refill  Of  losarten    Chart   Reviewed   acoording to  Record   It  Was  E  rx  To  Verlene Mayer on 10/12  2018    - they  Were called  They  Stated  They had  No  Record  Of  It   Other  Pharmacy  On record   Which  Was  [facility]  Was  Called  As  Well  And  They  Had  No  Record  Of  It

## 2017-12-14 ENCOUNTER — Other Ambulatory Visit: Payer: Self-pay | Admitting: Unknown Physician Specialty

## 2017-12-14 DIAGNOSIS — E785 Hyperlipidemia, unspecified: Secondary | ICD-10-CM

## 2017-12-15 ENCOUNTER — Other Ambulatory Visit: Payer: Self-pay | Admitting: Unknown Physician Specialty

## 2018-01-10 ENCOUNTER — Other Ambulatory Visit: Payer: Self-pay | Admitting: Unknown Physician Specialty

## 2018-01-22 ENCOUNTER — Ambulatory Visit (INDEPENDENT_AMBULATORY_CARE_PROVIDER_SITE_OTHER): Payer: BLUE CROSS/BLUE SHIELD | Admitting: Unknown Physician Specialty

## 2018-01-22 ENCOUNTER — Encounter: Payer: Self-pay | Admitting: Unknown Physician Specialty

## 2018-01-22 VITALS — BP 139/82 | HR 83 | Temp 98.4°F | Ht 61.0 in | Wt 181.4 lb

## 2018-01-22 DIAGNOSIS — D139 Benign neoplasm of ill-defined sites within the digestive system: Secondary | ICD-10-CM | POA: Diagnosis not present

## 2018-01-22 DIAGNOSIS — E1165 Type 2 diabetes mellitus with hyperglycemia: Secondary | ICD-10-CM

## 2018-01-22 DIAGNOSIS — Z0001 Encounter for general adult medical examination with abnormal findings: Secondary | ICD-10-CM | POA: Diagnosis not present

## 2018-01-22 DIAGNOSIS — Z23 Encounter for immunization: Secondary | ICD-10-CM

## 2018-01-22 DIAGNOSIS — I10 Essential (primary) hypertension: Secondary | ICD-10-CM | POA: Diagnosis not present

## 2018-01-22 DIAGNOSIS — IMO0002 Reserved for concepts with insufficient information to code with codable children: Secondary | ICD-10-CM

## 2018-01-22 DIAGNOSIS — E785 Hyperlipidemia, unspecified: Secondary | ICD-10-CM | POA: Diagnosis not present

## 2018-01-22 DIAGNOSIS — I129 Hypertensive chronic kidney disease with stage 1 through stage 4 chronic kidney disease, or unspecified chronic kidney disease: Secondary | ICD-10-CM

## 2018-01-22 DIAGNOSIS — Z0000 Encounter for general adult medical examination without abnormal findings: Secondary | ICD-10-CM

## 2018-01-22 LAB — BAYER DCA HB A1C WAIVED: HB A1C (BAYER DCA - WAIVED): 7.7 % — ABNORMAL HIGH (ref <7.0)

## 2018-01-22 MED ORDER — SAXAGLIPTIN HCL 5 MG PO TABS: ORAL_TABLET | ORAL | 1 refills | Status: DC

## 2018-01-22 MED ORDER — LOSARTAN POTASSIUM-HCTZ 100-12.5 MG PO TABS: 1.0000 | ORAL_TABLET | Freq: Every day | ORAL | 1 refills | Status: DC

## 2018-01-22 MED ORDER — CANAGLIFLOZIN 100 MG PO TABS: 100.0000 mg | ORAL_TABLET | Freq: Every day | ORAL | 1 refills | Status: DC

## 2018-01-22 NOTE — Assessment & Plan Note (Addendum)
Not to goal of below 130.  Pt ed on systolic goal of less than 130.

## 2018-01-22 NOTE — Progress Notes (Signed)
BP 139/82   Pulse 83   Temp 98.4 F (36.9 C) (Oral)   Ht 5\' 1"  (1.549 m)   Wt 181 lb 6.4 oz (82.3 kg)   LMP  (LMP Unknown)   SpO2 98%   BMI 34.28 kg/m    Subjective:    Patient ID: Christine Booth, female    DOB: 07-13-1956, 62 y.o.   MRN: [id number]  HPI: Christine Booth is a 62 y.o. female  Chief Complaint  Patient presents with  . Annual Exam  . Diabetes    pt states she is going to schedule an eye exam soon    Diabetes: Using medications without difficulties No hypoglycemic episodes No hyperglycemic episodes Feet problems:  Blood Sugars averaging:160 average eye exam within last year Getting ready to have it Last Hgb A1C: 7.3  Hypertension  Using medications without difficulty Average home BPs   Using medication without problems or lightheadedness No chest pain with exertion or shortness of breath No Edema  Elevated Cholesterol Using medications without problems No Muscle aches  Diet: The same Exercise: Exercising more  The 10-year ASCVD risk score Mikey Bussing DC Jr., et al., 2013) is: 11.8%   Values used to calculate the score:     Age: 33 years     Sex: Female     Is Non-Hispanic African American: No     Diabetic: Yes     Tobacco smoker: No     Systolic Blood Pressure: 948 mmHg     Is BP treated: Yes     HDL Cholesterol: 47 mg/dL     Total Cholesterol: 143 mg/dL   Depression screen [facility] 2/9 01/22/2018 01/12/2017 12/11/2015  Decreased Interest 0 0 0  Down, Depressed, Hopeless 0 0 0  PHQ - 2 Score 0 0 0  Altered sleeping 0 0 -  Tired, decreased energy 0 0 -  Change in appetite 0 0 -  Feeling bad or failure about yourself  0 0 -  Trouble concentrating 0 0 -  Moving slowly or fidgety/restless 0 0 -  Suicidal thoughts 0 0 -  PHQ-9 Score 0 0 -      Social History   Socioeconomic History  . Marital status: Divorced    Spouse name: Not on file  . Number of children: Not on file  . Years of education: Not on file  . Highest education  level: Not on file  Social Needs  . Financial resource strain: Not on file  . Food insecurity - worry: Not on file  . Food insecurity - inability: Not on file  . Transportation needs - medical: Not on file  . Transportation needs - non-medical: Not on file  Occupational History  . Not on file  Tobacco Use  . Smoking status: Never Smoker  . Smokeless tobacco: Never Used  Substance and Sexual Activity  . Alcohol use: No    Alcohol/week: 0.0 oz  . Drug use: No  . Sexual activity: No  Other Topics Concern  . Not on file  Social History Narrative  . Not on file   Family History  Problem Relation Age of Onset  . Arthritis Mother   . Cancer Mother        breast  . Heart disease Mother   . Cancer Father        bladder  . Heart disease Father   . Hypertension Father    Past Medical History:  Diagnosis Date  . Allergy   . Chronic  kidney disease stage 1  . Diabetes mellitus without complication ([place])   . Gastrointestinal disorder   . GERD (gastroesophageal reflux disease)   . Heart murmur   . Hyperlipidemia   . Hypertension   . Rosacea    Past Surgical History:  Procedure Laterality Date  . COLONOSCOPY WITH PROPOFOL N/A 02/06/2017   Procedure: COLONOSCOPY WITH PROPOFOL;  Surgeon: Jonathon Bellows, MD;  Location: [facility] ENDOSCOPY;  Service: Endoscopy;  Laterality: N/A;  . TUBAL LIGATION  1985     Relevant past medical, surgical, family and social history reviewed and updated as indicated. Interim medical history since our last visit reviewed. Allergies and medications reviewed and updated.  Review of Systems  Constitutional: Negative.   HENT: Negative.   Eyes: Negative.   Respiratory: Negative.   Cardiovascular: Negative.   Gastrointestinal: Positive for constipation.  Endocrine: Negative.   Genitourinary: Negative.   Musculoskeletal: Negative.   Skin: Negative.   Allergic/Immunologic: Negative.   Neurological: Negative.   Hematological: Negative.     Psychiatric/Behavioral: Negative.     Per HPI unless specifically indicated above     Objective:    BP 139/82   Pulse 83   Temp 98.4 F (36.9 C) (Oral)   Ht 5\' 1"  (1.549 m)   Wt 181 lb 6.4 oz (82.3 kg)   LMP  (LMP Unknown)   SpO2 98%   BMI 34.28 kg/m   Wt Readings from Last 3 Encounters:  01/22/18 181 lb 6.4 oz (82.3 kg)  07/15/17 172 lb 9.6 oz (78.3 kg)  04/10/17 170 lb 6.4 oz (77.3 kg)    Physical Exam  Constitutional: She is oriented to person, place, and time. She appears well-developed and well-nourished.  HENT:  Head: Normocephalic and atraumatic.  Eyes: Pupils are equal, round, and reactive to light. Right eye exhibits no discharge. Left eye exhibits no discharge. No scleral icterus.  Neck: Normal range of motion. Neck supple. Carotid bruit is not present. No thyromegaly present.  Cardiovascular: Normal rate, regular rhythm and normal heart sounds. Exam reveals no gallop and no friction rub.  No murmur heard. Pulmonary/Chest: Effort normal and breath sounds normal. No respiratory distress. She has no wheezes. She has no rales.  Abdominal: Soft. Bowel sounds are normal. There is no tenderness. There is no rebound.  Genitourinary: No breast swelling, tenderness or discharge.  Musculoskeletal: Normal range of motion.  Lymphadenopathy:    She has no cervical adenopathy.  Neurological: She is alert and oriented to person, place, and time.  Skin: Skin is warm, dry and intact. No rash noted.  Psychiatric: She has a normal mood and affect. Her speech is normal and behavior is normal. Judgment and thought content normal. Cognition and memory are normal.    Results for orders placed or performed in visit on 07/15/17  Comprehensive metabolic panel  Result Value Ref Range   Glucose 133 (H) 65 - 99 mg/dL   BUN 13 8 - 27 mg/dL   Creatinine, Ser 0.78 0.57 - 1.00 mg/dL   GFR calc non Af Amer 82 >59 mL/min/1.73   GFR calc Af Amer 94 >59 mL/min/1.73   BUN/Creatinine Ratio 17  12 - 28   Sodium 138 134 - 144 mmol/L   Potassium 3.7 3.5 - 5.2 mmol/L   Chloride 101 96 - 106 mmol/L   CO2 22 20 - 29 mmol/L   Calcium 9.6 8.7 - 10.3 mg/dL   Total Protein 6.8 6.0 - 8.5 g/dL   Albumin 4.8 3.6 - 4.8 g/dL  Globulin, Total 2.0 1.5 - 4.5 g/dL   Albumin/Globulin Ratio 2.4 (H) 1.2 - 2.2   Bilirubin Total 0.5 0.0 - 1.2 mg/dL   Alkaline Phosphatase 67 39 - 117 IU/L   AST 21 0 - 40 IU/L   ALT 16 0 - 32 IU/L  Bayer DCA Hb A1c Waived  Result Value Ref Range   Bayer DCA Hb A1c Waived 7.3 (H) <7.0 %      Assessment & Plan:   Problem List Items Addressed This Visit      Unprioritized   Benign hypertension with chronic kidney disease   Relevant Medications   losartan-hydrochlorothiazide (HYZAAR) 100-12.5 MG tablet   Other Relevant Orders   Comprehensive metabolic panel   Essential hypertension, benign    Not to goal of below 130.  Pt ed on systolic goal of less than 130.        Relevant Medications   losartan-hydrochlorothiazide (HYZAAR) 100-12.5 MG tablet   Hyperlipidemia    Stable, continue present medications but check labs today       Relevant Medications   losartan-hydrochlorothiazide (HYZAAR) 100-12.5 MG tablet   Other Relevant Orders   Lipid Panel w/o Chol/HDL Ratio   Uncontrolled type 2 diabetes mellitus (HCC)    Hgb A1C is 7.7.  She would like to work on diet and exercise changes.        Relevant Medications   canagliflozin (INVOKANA) 100 MG TABS tablet   losartan-hydrochlorothiazide (HYZAAR) 100-12.5 MG tablet   saxagliptin HCl (ONGLYZA) 5 MG TABS tablet   Other Relevant Orders   Bayer DCA Hb A1c Waived    Other Visit Diagnoses    Need for influenza vaccination    -  Primary   Relevant Orders   Flu Vaccine QUAD 36+ mos IM (Completed)   Annual physical exam       Relevant Orders   CBC with Differential/Platelet   TSH   Gastrointestinal polyp       Relevant Orders   Ambulatory referral to Gastroenterology   Need for Td vaccine        Relevant Orders   Td vaccine greater than or equal to 7yo preservative free IM (Completed)   Hypertensive nephropathy, 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

## 2018-01-22 NOTE — Patient Instructions (Addendum)
Influenza (Flu) Vaccine (Inactivated or Recombinant): What You Need to Know 1. Why get vaccinated? Influenza ("flu") is a contagious disease that spreads around the [place] every year, usually between October and May. Flu is caused by influenza viruses, and is spread mainly by coughing, sneezing, and close contact. Anyone can get flu. Flu strikes suddenly and can last several days. Symptoms vary by age, but can include:  fever/chills  sore throat  muscle aches  fatigue  cough  headache  runny or stuffy nose Flu can also lead to pneumonia and blood infections, and cause diarrhea and seizures in children. If you have a medical condition, such as heart or lung disease, flu can make it worse. Flu is more dangerous for some people. Infants and young children, people 65 years of age and older, pregnant women, and people with certain health conditions or a weakened immune system are at greatest risk. Each year thousands of people in the [place] die from flu, and many more are hospitalized. Flu vaccine can:  keep you from getting flu,  make flu less severe if you do get it, and  keep you from spreading flu to your family and other people. 2. Inactivated and recombinant flu vaccines A dose of flu vaccine is recommended every flu season. Children 6 months through 8 years of age may need two doses during the same flu season. Everyone else needs only one dose each flu season. Some inactivated flu vaccines contain a very small amount of a mercury-based preservative called thimerosal. Studies have not shown thimerosal in vaccines to be harmful, but flu vaccines that do not contain thimerosal are available. There is no live flu virus in flu shots. They cannot cause the flu. There are many flu viruses, and they are always changing. Each year a new flu vaccine is made to protect against three or four viruses that are likely to cause disease in the upcoming flu season. But even when the  vaccine doesn't exactly match these viruses, it may still provide some protection. Flu vaccine cannot prevent:  flu that is caused by a virus not covered by the vaccine, or  illnesses that look like flu but are not. It takes about 2 weeks for protection to develop after vaccination, and protection lasts through the flu season. 3. Some people should not get this vaccine Tell the person who is giving you the vaccine:  If you have any severe, life-threatening allergies. If you ever had a life-threatening allergic reaction after a dose of flu vaccine, or have a severe allergy to any part of this vaccine, you may be advised not to get vaccinated. Most, but not all, types of flu vaccine contain a small amount of egg protein.  If you ever had Guillain-Barr Syndrome (also called GBS). Some people with a history of GBS should not get this vaccine. This should be discussed with your doctor.  If you are not feeling well. It is usually okay to get flu vaccine when you have a mild illness, but you might be asked to come back when you feel better. 4. Risks of a vaccine reaction With any medicine, including vaccines, there is a chance of reactions. These are usually mild and go away on their own, but serious reactions are also possible. Most people who get a flu shot do not have any problems with it. Minor problems following a flu shot include:  soreness, redness, or swelling where the shot was given  hoarseness  sore, red or itchy   eyes  cough  fever  aches  headache  itching  fatigue If these problems occur, they usually begin soon after the shot and last 1 or 2 days. More serious problems following a flu shot can include the following:  There may be a small increased risk of Guillain-Barre Syndrome (GBS) after inactivated flu vaccine. This risk has been estimated at 1 or 2 additional cases per million people vaccinated. This is much lower than the risk of severe complications from flu,  which can be prevented by flu vaccine.  Young children who get the flu shot along with pneumococcal vaccine (PCV13) and/or DTaP vaccine at the same time might be slightly more likely to have a seizure caused by fever. Ask your doctor for more information. Tell your doctor if a child who is getting flu vaccine has ever had a seizure. Problems that could happen after any injected vaccine:  People sometimes faint after a medical procedure, including vaccination. Sitting or lying down for about 15 minutes can help prevent fainting, and injuries caused by a fall. Tell your doctor if you feel dizzy, or have vision changes or ringing in the ears.  Some people get severe pain in the shoulder and have difficulty moving the arm where a shot was given. This happens very rarely.  Any medication can cause a severe allergic reaction. Such reactions from a vaccine are very rare, estimated at about 1 in a million doses, and would happen within a few minutes to a few hours after the vaccination. As with any medicine, there is a very remote chance of a vaccine causing a serious injury or death. The safety of vaccines is always being monitored. For more information, visit: www.cdc.gov/vaccinesafety/ 5. What if there is a serious reaction? What should I look for? Look for anything that concerns you, such as signs of a severe allergic reaction, very high fever, or unusual behavior. Signs of a severe allergic reaction can include hives, swelling of the face and throat, difficulty breathing, a fast heartbeat, dizziness, and weakness. These would start a few minutes to a few hours after the vaccination. What should I do?  If you think it is a severe allergic reaction or other emergency that can't wait, call 9-1-1 and get the person to the nearest hospital. Otherwise, call your doctor.  Reactions should be reported to the Vaccine Adverse Event Reporting System (VAERS). Your doctor should file this report, or you can do  it yourself through the VAERS web site at www.vaers.hhs.gov, or by calling 1-800-822-7967.  VAERS does not give medical advice. 6. The National Vaccine Injury Compensation Program The National Vaccine Injury Compensation Program (VICP) is a federal program that was created to compensate people who may have been injured by certain vaccines. Persons who believe they may have been injured by a vaccine can learn about the program and about filing a claim by calling 1-800-338-2382 or visiting the VICP website at www.hrsa.gov/vaccinecompensation. There is a time limit to file a claim for compensation. 7. How can I learn more?  Ask your healthcare provider. He or she can give you the vaccine package insert or suggest other sources of information.  Call your local or state health department.  Contact the Centers for Disease Control and Prevention (CDC):  Call 1-800-232-4636 (1-800-CDC-INFO) or  Visit CDC's website at www.cdc.gov/flu Vaccine Information Statement, Inactivated Influenza Vaccine (07/14/2014) This information is not intended to replace advice given to you by your health care provider. Make sure you discuss any questions you   have with your health care provider. Document Released: 09/18/2006 Document Revised: 08/14/2016 Document Reviewed: 08/14/2016 Elsevier Interactive Patient Education  2017 [employer] Td Vaccine (Tetanus and Diphtheria): What You Need to Know 1. Why get vaccinated? Tetanus  and diphtheria are very serious diseases. They are rare in the [place] today, but people who do become infected often have severe complications. Td vaccine is used to protect adolescents and adults from both of these diseases. Both tetanus and diphtheria are infections caused by bacteria. Diphtheria spreads from person to person through coughing or sneezing. Tetanus-causing bacteria enter the body through cuts, scratches, or wounds. TETANUS (lockjaw) causes painful muscle tightening and  stiffness, usually all over the body.  It can lead to tightening of muscles in the head and neck so you can't open your mouth, swallow, or sometimes even breathe. Tetanus kills about 1 out of every 10 people who are infected even after receiving the best medical care. DIPHTHERIA can cause a thick coating to form in the back of the throat.  It can lead to breathing problems, paralysis, heart failure, and death. Before vaccines, as many as 200,000 cases of diphtheria and hundreds of cases of tetanus were reported in the [place] each year. Since vaccination began, reports of cases for both diseases have dropped by about 99%. 2. Td vaccine Td vaccine can protect adolescents and adults from tetanus and diphtheria. Td is usually given as a booster dose every 10 years but it can also be given earlier after a severe and dirty wound or burn. Another vaccine, called Tdap, which protects against pertussis in addition to tetanus and diphtheria, is sometimes recommended instead of Td vaccine. Your doctor or the person giving you the vaccine can give you more information. Td may safely be given at the same time as other vaccines. 3. Some people should not get this vaccine  A person who has ever had a life-threatening allergic reaction after a previous dose of any tetanus or diphtheria containing vaccine, OR has a severe allergy to any part of this vaccine, should not get Td vaccine. Tell the person giving the vaccine about any severe allergies.  Talk to your doctor if you:  had severe pain or swelling after any vaccine containing diphtheria or tetanus,  ever had a condition called Guillain Barre Syndrome (GBS),  aren't feeling well on the day the shot is scheduled. 4. What are the risks from Td vaccine? With any medicine, including vaccines, there is a chance of side effects. These are usually mild and go away on their own. Serious reactions are also possible but are rare. Most people who get Td  vaccine do not have any problems with it. Mild problems following Td vaccine: (Did not interfere with activities)  Pain where the shot was given (about 8 people in 10)  Redness or swelling where the shot was given (about 1 person in 4)  Mild fever (rare)  Headache (about 1 person in 4)  Tiredness (about 1 person in 4) Moderate problems following Td vaccine: (Interfered with activities, but did not require medical attention)  Fever over 102F (rare) Severe problems following Td vaccine: (Unable to perform usual activities; required medical attention)  Swelling, severe pain, bleeding and/or redness in the arm where the shot was given (rare). Problems that could happen after any vaccine:  People sometimes faint after a medical procedure, including vaccination. Sitting or lying down for about 15 minutes can help prevent fainting, and injuries caused by a fall. Tell   your doctor if you feel dizzy, or have vision changes or ringing in the ears.  Some people get severe pain in the shoulder and have difficulty moving the arm where a shot was given. This happens very rarely.  Any medication can cause a severe allergic reaction. Such reactions from a vaccine are very rare, estimated at fewer than 1 in a million doses, and would happen within a few minutes to a few hours after the vaccination. As with any medicine, there is a very remote chance of a vaccine causing a serious injury or death. The safety of vaccines is always being monitored. For more information, visit: www.cdc.gov/vaccinesafety/ 5. What if there is a serious reaction? What should I look for? Look for anything that concerns you, such as signs of a severe allergic reaction, very high fever, or unusual behavior. Signs of a severe allergic reaction can include hives, swelling of the face and throat, difficulty breathing, a fast heartbeat, dizziness, and weakness. These would usually start a few minutes to a few hours after the  vaccination. What should I do?  If you think it is a severe allergic reaction or other emergency that can't wait, call 9-1-1 or get the person to the nearest hospital. Otherwise, call your doctor.  Afterward, the reaction should be reported to the Vaccine Adverse Event Reporting System (VAERS). Your doctor might file this report, or you can do it yourself through the VAERS web site at www.vaers.hhs.gov, or by calling 1-800-822-7967.  VAERS does not give medical advice. 6. The National Vaccine Injury Compensation Program The National Vaccine Injury Compensation Program (VICP) is a federal program that was created to compensate people who may have been injured by certain vaccines. Persons who believe they may have been injured by a vaccine can learn about the program and about filing a claim by calling 1-800-338-2382 or visiting the VICP website at www.hrsa.gov/vaccinecompensation. There is a time limit to file a claim for compensation. 7. How can I learn more?  Ask your doctor. He or she can give you the vaccine package insert or suggest other sources of information.  Call your local or state health department.  Contact the Centers for Disease Control and Prevention (CDC):  Call 1-800-232-4636 (1-800-CDC-INFO)  Visit CDC's website at www.cdc.gov/vaccines CDC Td Vaccine VIS (03/18/16) This information is not intended to replace advice given to you by your health care provider. Make sure you discuss any questions you have with your health care provider. Document Released: 09/21/2006 Document Revised: 08/14/2016 Document Reviewed: 08/14/2016 Elsevier Interactive Patient Education  2017 [employer]vier Inc[employer]  

## 2018-01-22 NOTE — Assessment & Plan Note (Signed)
Stable, continue present medications but check labs today

## 2018-01-22 NOTE — Assessment & Plan Note (Signed)
Hgb A1C is 7.7.  She would like to work on diet and exercise changes.

## 2018-01-23 LAB — CBC WITH DIFFERENTIAL/PLATELET
Basophils Absolute: 0.1 10*3/uL (ref 0.0–0.2)
Basos: 1 %
EOS (ABSOLUTE): 0.1 10*3/uL (ref 0.0–0.4)
Eos: 2 %
Hematocrit: 43.2 % (ref 34.0–46.6)
Hemoglobin: 14.3 g/dL (ref 11.1–15.9)
Immature Grans (Abs): 0 10*3/uL (ref 0.0–0.1)
Immature Granulocytes: 0 %
Lymphocytes Absolute: 2.3 10*3/uL (ref 0.7–3.1)
Lymphs: 40 %
MCH: 30 pg (ref 26.6–33.0)
MCHC: 33.1 g/dL (ref 31.5–35.7)
MCV: 91 fL (ref 79–97)
Monocytes Absolute: 0.4 10*3/uL (ref 0.1–0.9)
Monocytes: 7 %
Neutrophils Absolute: 2.8 10*3/uL (ref 1.4–7.0)
Neutrophils: 50 %
Platelets: 237 10*3/uL (ref 150–379)
RBC: 4.76 x10E6/uL (ref 3.77–5.28)
RDW: 13.9 % (ref 12.3–15.4)
WBC: 5.7 10*3/uL (ref 3.4–10.8)

## 2018-01-23 LAB — COMPREHENSIVE METABOLIC PANEL
ALT: 17 IU/L (ref 0–32)
AST: 15 IU/L (ref 0–40)
Albumin/Globulin Ratio: 1.9 (ref 1.2–2.2)
Albumin: 4.5 g/dL (ref 3.6–4.8)
Alkaline Phosphatase: 71 IU/L (ref 39–117)
BUN/Creatinine Ratio: 24 (ref 12–28)
BUN: 17 mg/dL (ref 8–27)
Bilirubin Total: 0.5 mg/dL (ref 0.0–1.2)
CO2: 21 mmol/L (ref 20–29)
Calcium: 9.9 mg/dL (ref 8.7–10.3)
Chloride: 101 mmol/L (ref 96–106)
Creatinine, Ser: 0.7 mg/dL (ref 0.57–1.00)
GFR calc Af Amer: 107 mL/min/{1.73_m2} (ref >59)
GFR calc non Af Amer: 93 mL/min/{1.73_m2} (ref >59)
Globulin, Total: 2.4 g/dL (ref 1.5–4.5)
Glucose: 177 mg/dL — ABNORMAL HIGH (ref 65–99)
Potassium: 4.2 mmol/L (ref 3.5–5.2)
Sodium: 140 mmol/L (ref 134–144)
Total Protein: 6.9 g/dL (ref 6.0–8.5)

## 2018-01-23 LAB — LIPID PANEL W/O CHOL/HDL RATIO
Cholesterol, Total: 157 mg/dL (ref 100–199)
HDL: 48 mg/dL (ref >39)
LDL Calculated: 83 mg/dL (ref 0–99)
Triglycerides: 132 mg/dL (ref 0–149)
VLDL Cholesterol Cal: 26 mg/dL (ref 5–40)

## 2018-01-23 LAB — TSH: TSH: 1.57 u[IU]/mL (ref 0.450–4.500)

## 2018-01-25 NOTE — Progress Notes (Signed)
Notified pt by mychart

## 2018-01-26 ENCOUNTER — Encounter: Payer: Self-pay | Admitting: Unknown Physician Specialty

## 2018-01-26 NOTE — Addendum Note (Signed)
Addended by: Kathrine Haddock on: 01/26/2018 04:29 PM   Modules accepted: Level of Service

## 2018-02-05 ENCOUNTER — Encounter: Payer: Self-pay | Admitting: *Deleted

## 2018-02-17 ENCOUNTER — Telehealth: Payer: Self-pay

## 2018-02-17 NOTE — Telephone Encounter (Signed)
Gastroenterology Pre-Procedure Review  Request Date: 04/02/18 Requesting Physician: Dr. Vicente Males   PATIENT REVIEW QUESTIONS: The patient responded to the following health history questions as indicated:    1. Are you having any GI issues? No  2. Do you have a personal history of Polyps? Yes, Tubular Adenoma  3. Do you have a family history of Colon Cancer or Polyps? No  4. Diabetes Mellitus? Yes  5. Joint replacements in the past 12 months? No  6. Major health problems in the past 3 months? No  7. Any artificial heart valves, MVP, or defibrillator? No     MEDICATIONS & ALLERGIES:    Patient reports the following regarding taking any anticoagulation/antiplatelet therapy:   Plavix, Coumadin, Eliquis, Xarelto, Lovenox, Pradaxa, Brilinta, or Effient? No  Aspirin? Yes   Patient confirms/reports the following medications:  Current Outpatient Medications  Medication Sig Dispense Refill  . aspirin EC 81 MG tablet Take 81 mg by mouth daily.    . Biotin 300 MCG TABS Take 1 tablet by mouth daily.    . canagliflozin (INVOKANA) 100 MG TABS tablet Take 1 tablet (100 mg total) by mouth daily before breakfast. 90 tablet 1  . cetirizine (ZYRTEC) 10 MG tablet Take 10 mg by mouth daily.    . fluticasone (FLONASE) 50 MCG/ACT nasal spray PLACE 2 SPRAYS INTO BOTH NOSTRILS DAILY. 16 g 11  . glucose blood test strip Use as instructed 100 each 12  . losartan-hydrochlorothiazide (HYZAAR) 100-12.5 MG tablet Take 1 tablet by mouth daily. 90 tablet 1  . pantoprazole (PROTONIX) 40 MG tablet TAKE 1 TABLET (40 MG TOTAL) BY MOUTH DAILY. 90 tablet 1  . pravastatin (PRAVACHOL) 40 MG tablet TAKE 1 TABLET (40 MG TOTAL) BY MOUTH AT BEDTIME. 90 tablet 1  . saxagliptin HCl (ONGLYZA) 5 MG TABS tablet TAKE 1 TABLET (5 MG TOTAL) BY MOUTH DAILY. 90 tablet 1   No current facility-administered medications for this visit.     Patient confirms/reports the following allergies:  No Known Allergies  No orders of the defined types  were placed in this encounter.   AUTHORIZATION INFORMATION Primary Insurance: 1D#: Group #:  Secondary Insurance: 1D#: Group #:  SCHEDULE INFORMATION: Date: 04/02/18 Time: Location: [facility]

## 2018-02-18 ENCOUNTER — Telehealth: Payer: Self-pay | Admitting: Gastroenterology

## 2018-02-18 NOTE — Telephone Encounter (Signed)
Letters for colonoscopy printed.

## 2018-02-21 ENCOUNTER — Other Ambulatory Visit: Payer: Self-pay

## 2018-02-22 ENCOUNTER — Other Ambulatory Visit: Payer: Self-pay

## 2018-02-22 DIAGNOSIS — Z1211 Encounter for screening for malignant neoplasm of colon: Secondary | ICD-10-CM

## 2018-03-29 ENCOUNTER — Encounter: Payer: Self-pay | Admitting: Unknown Physician Specialty

## 2018-03-29 ENCOUNTER — Ambulatory Visit (INDEPENDENT_AMBULATORY_CARE_PROVIDER_SITE_OTHER): Payer: BLUE CROSS/BLUE SHIELD | Admitting: Unknown Physician Specialty

## 2018-03-29 VITALS — BP 125/75 | HR 91 | Temp 98.3°F | Ht 61.0 in | Wt 179.3 lb

## 2018-03-29 DIAGNOSIS — K5732 Diverticulitis of large intestine without perforation or abscess without bleeding: Secondary | ICD-10-CM | POA: Diagnosis not present

## 2018-03-29 DIAGNOSIS — R1030 Lower abdominal pain, unspecified: Secondary | ICD-10-CM

## 2018-03-29 LAB — CBC WITH DIFFERENTIAL/PLATELET
Hematocrit: 41.2 % (ref 34.0–46.6)
Hemoglobin: 14.7 g/dL (ref 11.1–15.9)
Lymphocytes Absolute: 2.4 10*3/uL (ref 0.7–3.1)
Lymphs: 24 %
MCH: 31.5 pg (ref 26.6–33.0)
MCHC: 35.7 g/dL (ref 31.5–35.7)
MCV: 88 fL (ref 79–97)
MID (Absolute): 1 10*3/uL (ref 0.1–1.6)
MID: 10 %
Neutrophils Absolute: 6.7 10*3/uL (ref 1.4–7.0)
Neutrophils: 67 %
Platelets: 239 10*3/uL (ref 150–379)
RBC: 4.66 x10E6/uL (ref 3.77–5.28)
RDW: 14 % (ref 12.3–15.4)
WBC: 10.1 10*3/uL (ref 3.4–10.8)

## 2018-03-29 LAB — UA/M W/RFLX CULTURE, ROUTINE
Bilirubin, UA: NEGATIVE
Ketones, UA: NEGATIVE
Leukocytes, UA: NEGATIVE
Nitrite, UA: NEGATIVE
Protein, UA: NEGATIVE
RBC, UA: NEGATIVE
Specific Gravity, UA: 1.01 (ref 1.005–1.030)
Urobilinogen, Ur: 0.2 mg/dL (ref 0.2–1.0)
pH, UA: 6 (ref 5.0–7.5)

## 2018-03-29 MED ORDER — METRONIDAZOLE 500 MG PO TABS: 500.0000 mg | ORAL_TABLET | Freq: Two times a day (BID) | ORAL | 1 refills | Status: DC

## 2018-03-29 MED ORDER — CIPROFLOXACIN HCL 500 MG PO TABS: 500.0000 mg | ORAL_TABLET | Freq: Two times a day (BID) | ORAL | 1 refills | Status: DC

## 2018-03-29 NOTE — Progress Notes (Signed)
BP 125/75   Pulse 91   Temp 98.3 F (36.8 C) (Temporal)   Ht 5\' 1"  (1.549 m)   Wt 179 lb 4.8 oz (81.3 kg)   LMP  (LMP Unknown)   SpO2 100%   BMI 33.88 kg/m    Subjective:    Patient ID: Christine Booth, female    DOB: May 10, 1956, 62 y.o.   MRN: [id number]  HPI: Christine Booth is a 62 y.o. female  Chief Complaint  Patient presents with  . Diverticulitis    pt states she started having stomach pains last weekend and thought she just ate something bad. but is still having pain and pressure when trying to use the bathroom   Abdominal Pain  This is a new problem. The current episode started in the past 7 days. The onset quality is gradual. The problem occurs constantly. The problem has been gradually worsening. The pain is located in the LLQ and RLQ. The pain is moderate. The quality of the pain is cramping. The abdominal pain does not radiate. Associated symptoms include a fever. Pertinent negatives include no anorexia, arthralgias, belching, constipation, diarrhea, dysuria, flatus, frequency, headaches, hematochezia, hematuria, melena, myalgias, nausea, vomiting or weight loss. Nothing aggravates the pain. The pain is relieved by nothing.    Relevant past medical, surgical, family and social history reviewed and updated as indicated. Interim medical history since our last visit reviewed. Allergies and medications reviewed and updated.  Review of Systems  Constitutional: Positive for fever. Negative for weight loss.  Gastrointestinal: Positive for abdominal pain. Negative for anorexia, constipation, diarrhea, flatus, hematochezia, melena, nausea and vomiting.  Genitourinary: Negative for dysuria, frequency and hematuria.  Musculoskeletal: Negative for arthralgias and myalgias.  Neurological: Negative for headaches.    Per HPI unless specifically indicated above     Objective:    BP 125/75   Pulse 91   Temp 98.3 F (36.8 C) (Temporal)   Ht 5\' 1"  (1.549 m)   Wt  179 lb 4.8 oz (81.3 kg)   LMP  (LMP Unknown)   SpO2 100%   BMI 33.88 kg/m   Wt Readings from Last 3 Encounters:  03/29/18 179 lb 4.8 oz (81.3 kg)  01/22/18 181 lb 6.4 oz (82.3 kg)  07/15/17 172 lb 9.6 oz (78.3 kg)    Physical Exam  Constitutional: She is oriented to person, place, and time. She appears well-developed and well-nourished. No distress.  HENT:  Head: Normocephalic and atraumatic.  Eyes: Conjunctivae and lids are normal. Right eye exhibits no discharge. Left eye exhibits no discharge. No scleral icterus.  Neck: Normal range of motion. Neck supple. No JVD present. Carotid bruit is not present.  Cardiovascular: Normal rate, regular rhythm and normal heart sounds.  Pulmonary/Chest: Effort normal and breath sounds normal.  Abdominal: Normal appearance. There is no splenomegaly or hepatomegaly. There is tenderness in the right lower quadrant and left lower quadrant.  Musculoskeletal: Normal range of motion.  Neurological: She is alert and oriented to person, place, and time.  Skin: Skin is warm, dry and intact. No rash noted. No pallor.  Psychiatric: She has a normal mood and affect. Her behavior is normal. Judgment and thought content normal.    Results for orders placed or performed in visit on 01/22/18  CBC with Differential/Platelet  Result Value Ref Range   WBC 5.7 3.4 - 10.8 x10E3/uL   RBC 4.76 3.77 - 5.28 x10E6/uL   Hemoglobin 14.3 11.1 - 15.9 g/dL   Hematocrit 43.2 34.0 -  46.6 %   MCV 91 79 - 97 fL   MCH 30.0 26.6 - 33.0 pg   MCHC 33.1 31.5 - 35.7 g/dL   RDW 13.9 12.3 - 15.4 %   Platelets 237 150 - 379 x10E3/uL   Neutrophils 50 Not Estab. %   Lymphs 40 Not Estab. %   Monocytes 7 Not Estab. %   Eos 2 Not Estab. %   Basos 1 Not Estab. %   Neutrophils Absolute 2.8 1.4 - 7.0 x10E3/uL   Lymphocytes Absolute 2.3 0.7 - 3.1 x10E3/uL   Monocytes Absolute 0.4 0.1 - 0.9 x10E3/uL   EOS (ABSOLUTE) 0.1 0.0 - 0.4 x10E3/uL   Basophils Absolute 0.1 0.0 - 0.2 x10E3/uL    Immature Granulocytes 0 Not Estab. %   Immature Grans (Abs) 0.0 0.0 - 0.1 x10E3/uL  Comprehensive metabolic panel  Result Value Ref Range   Glucose 177 (H) 65 - 99 mg/dL   BUN 17 8 - 27 mg/dL   Creatinine, Ser 0.70 0.57 - 1.00 mg/dL   GFR calc non Af Amer 93 >59 mL/min/1.73   GFR calc Af Amer 107 >59 mL/min/1.73   BUN/Creatinine Ratio 24 12 - 28   Sodium 140 134 - 144 mmol/L   Potassium 4.2 3.5 - 5.2 mmol/L   Chloride 101 96 - 106 mmol/L   CO2 21 20 - 29 mmol/L   Calcium 9.9 8.7 - 10.3 mg/dL   Total Protein 6.9 6.0 - 8.5 g/dL   Albumin 4.5 3.6 - 4.8 g/dL   Globulin, Total 2.4 1.5 - 4.5 g/dL   Albumin/Globulin Ratio 1.9 1.2 - 2.2   Bilirubin Total 0.5 0.0 - 1.2 mg/dL   Alkaline Phosphatase 71 39 - 117 IU/L   AST 15 0 - 40 IU/L   ALT 17 0 - 32 IU/L  Lipid Panel w/o Chol/HDL Ratio  Result Value Ref Range   Cholesterol, Total 157 100 - 199 mg/dL   Triglycerides 132 0 - 149 mg/dL   HDL 48 >39 mg/dL   VLDL Cholesterol Cal 26 5 - 40 mg/dL   LDL Calculated 83 0 - 99 mg/dL  TSH  Result Value Ref Range   TSH 1.570 0.450 - 4.500 uIU/mL  Bayer DCA Hb A1c Waived  Result Value Ref Range   Bayer DCA Hb A1c Waived 7.7 (H) <7.0 %      Assessment & Plan:   Problem List Items Addressed This Visit    None    Visit Diagnoses    Lower abdominal pain    -  Primary   consistent with diverticultis.  Also discussed treating constipation with Miralax   Relevant Orders   UA/M w/rflx Culture, Routine   CBC With Differential/Platelet   Diverticulitis of large intestine without perforation or abscess without bleeding       Pt with history and symptoms consistent with diverticulitis.  Rx for Cipro and Flagyl   Relevant Medications   ciprofloxacin (CIPRO) 500 MG tablet   metroNIDAZOLE (FLAGYL) 500 MG tablet       Follow up plan: Return if symptoms worsen or fail to improve.

## 2018-03-29 NOTE — Patient Instructions (Addendum)
Diverticulitis °Diverticulitis is infection or inflammation of small pouches (diverticula) in the colon that form due to a condition called diverticulosis. Diverticula can trap stool (feces) and bacteria, causing infection and inflammation. °Diverticulitis may cause severe stomach pain and diarrhea. It may lead to tissue damage in the colon that causes bleeding. The diverticula may also burst (rupture) and cause infected stool to enter other areas of the abdomen. °Complications of diverticulitis can include: °· Bleeding. °· Severe infection. °· Severe pain. °· Rupture (perforation) of the colon. °· Blockage (obstruction) of the colon. ° °What are the causes? °This condition is caused by stool becoming trapped in the diverticula, which allows bacteria to grow in the diverticula. This leads to inflammation and infection. °What increases the risk? °You are more likely to develop this condition if: °· You have diverticulosis. The risk for diverticulosis increases if: °? You are overweight or obese. °? You use tobacco products. °? You do not get enough exercise. °· You eat a diet that does not include enough fiber. High-fiber foods include fruits, vegetables, beans, nuts, and whole grains. ° °What are the signs or symptoms? °Symptoms of this condition may include: °· Pain and tenderness in the abdomen. The pain is normally located on the left side of the abdomen, but it may occur in other areas. °· Fever and chills. °· Bloating. °· Cramping. °· Nausea. °· Vomiting. °· Changes in bowel routines. °· Blood in your stool. ° °How is this diagnosed? °This condition is diagnosed based on: °· Your medical history. °· A physical exam. °· Tests to make sure there is nothing else causing your condition. These tests may include: °? Blood tests. °? Urine tests. °? Imaging tests of the abdomen, including X-rays, ultrasounds, MRIs, or CT scans. ° °How is this treated? °Most cases of this condition are mild and can be treated at home.  Treatment may include: °· Taking over-the-counter pain medicines. °· Following a clear liquid diet. °· Taking antibiotic medicines by mouth. °· Rest. ° °More severe cases may need to be treated at a hospital. Treatment may include: °· Not eating or drinking. °· Taking prescription pain medicine. °· Receiving antibiotic medicines through an IV tube. °· Receiving fluids and nutrition through an IV tube. °· Surgery. ° °When your condition is under control, your health care provider may recommend that you have a colonoscopy. This is an exam to look at the entire large intestine. During the exam, a lubricated, bendable tube is inserted into the anus and then passed into the rectum, colon, and other parts of the large intestine. A colonoscopy can show how severe your diverticula are and whether something else may be causing your symptoms. °Follow these instructions at home: °Medicines °· Take over-the-counter and prescription medicines only as told by your health care provider. These include fiber supplements, probiotics, and stool softeners. °· If you were prescribed an antibiotic medicine, take it as told by your health care provider. Do not stop taking the antibiotic even if you start to feel better. °· Do not drive or use heavy machinery while taking prescription pain medicine. °General instructions °· Follow a full liquid diet or another diet as directed by your health care provider. After your symptoms improve, your health care provider may tell you to change your diet. He or she may recommend that you eat a diet that contains at least 25 g (25 grams) of fiber daily. Fiber makes it easier to pass stool. Healthy sources of fiber include: °? Berries. One cup   contains 4-8 grams of fiber. °? Beans or lentils. One half cup contains 5-8 grams of fiber. °? Green vegetables. One cup contains 4 grams of fiber. °· Exercise for at least 30 minutes, 3 times each week. You should exercise hard enough to raise your heart rate and  break a sweat. °· Keep all follow-up visits as told by your health care provider. This is important. You may need a colonoscopy. °Contact a health care provider if: °· Your pain does not improve. °· You have a hard time drinking or eating food. °· Your bowel movements do not return to normal. °Get help right away if: °· Your pain gets worse. °· Your symptoms do not get better with treatment. °· Your symptoms suddenly get worse. °· You have a fever. °· You vomit more than one time. °· You have stools that are bloody, black, or tarry. °Summary °· Diverticulitis is infection or inflammation of small pouches (diverticula) in the colon that form due to a condition called diverticulosis. Diverticula can trap stool (feces) and bacteria, causing infection and inflammation. °· You are at higher risk for this condition if you have diverticulosis and you eat a diet that does not include enough fiber. °· Most cases of this condition are mild and can be treated at home. More severe cases may need to be treated at a hospital. °· When your condition is under control, your health care provider may recommend that you have an exam called a colonoscopy. This exam can show how severe your diverticula are and whether something else may be causing your symptoms. °This information is not intended to replace advice given to you by your health care provider. Make sure you discuss any questions you have with your health care provider. °Document Released: 09/03/2005 Document Revised: 12/27/2016 Document Reviewed: 12/27/2016 °Elsevier Interactive Patient Education © 2018 [employer] ° °

## 2018-04-02 ENCOUNTER — Ambulatory Visit
Admission: RE | Admit: 2018-04-02 | Payer: BLUE CROSS/BLUE SHIELD | Source: Ambulatory Visit | Admitting: Gastroenterology

## 2018-04-02 ENCOUNTER — Encounter: Admission: RE | Payer: Self-pay | Source: Ambulatory Visit

## 2018-04-02 SURGERY — COLONOSCOPY WITH PROPOFOL
Anesthesia: General

## 2018-04-23 ENCOUNTER — Encounter: Payer: Self-pay | Admitting: Unknown Physician Specialty

## 2018-04-23 ENCOUNTER — Ambulatory Visit (INDEPENDENT_AMBULATORY_CARE_PROVIDER_SITE_OTHER): Payer: BLUE CROSS/BLUE SHIELD | Admitting: Unknown Physician Specialty

## 2018-04-23 VITALS — BP 144/82 | HR 73 | Temp 97.7°F | Ht 61.0 in | Wt 179.2 lb

## 2018-04-23 DIAGNOSIS — E1165 Type 2 diabetes mellitus with hyperglycemia: Secondary | ICD-10-CM

## 2018-04-23 DIAGNOSIS — I10 Essential (primary) hypertension: Secondary | ICD-10-CM

## 2018-04-23 DIAGNOSIS — E785 Hyperlipidemia, unspecified: Secondary | ICD-10-CM | POA: Diagnosis not present

## 2018-04-23 LAB — BAYER DCA HB A1C WAIVED: HB A1C (BAYER DCA - WAIVED): 8.2 % — ABNORMAL HIGH (ref <7.0)

## 2018-04-23 MED ORDER — CANAGLIFLOZIN 300 MG PO TABS: 300.0000 mg | ORAL_TABLET | Freq: Every day | ORAL | 1 refills | Status: DC

## 2018-04-23 NOTE — Assessment & Plan Note (Signed)
Not to goal today.  She will check more often. Increase Invokanna which may have a BP lowering effect

## 2018-04-23 NOTE — Assessment & Plan Note (Addendum)
Hgb A1C is 8.2%.  Increase Invokana from 100 to 300 mg.  She will work on lifestyle.

## 2018-04-23 NOTE — Assessment & Plan Note (Signed)
Stable, continue present medications.   

## 2018-04-23 NOTE — Progress Notes (Signed)
BP (!) 144/82 (BP Location: Left Arm, Cuff Size: Normal)   Pulse 73   Temp 97.7 F (36.5 C) (Oral)   Ht 5\' 1"  (1.549 m)   Wt 179 lb 3.2 oz (81.3 kg)   LMP  (LMP Unknown)   SpO2 99%   BMI 33.86 kg/m    Subjective:    Patient ID: Christine Booth, female    DOB: [birth year], 62 y.o.   MRN: [id number]  HPI: Christine Booth is a 62 y.o. female  Chief Complaint  Patient presents with  . Diabetes  . Hyperlipidemia  . Hypertension   Diabetes: Using medications without difficulties No hypoglycemic episodes No hyperglycemic episodes Feet problems: none Blood Sugars averaging: 150 eye exam within last year Last Hgb A1C: 7.7 and planned to work on diet and exercise but got side-tracked by diverticulitis  Hypertension  Using medications without difficulty Average home BPs:  Stress at work.  Up this week   Using medication without problems or lightheadedness No chest pain with exertion or shortness of breath No Edema  Elevated Cholesterol Using medications without problems No Muscle aches  Diet: Exercise: Back on track   Relevant past medical, surgical, family and social history reviewed and updated as indicated. Interim medical history since our last visit reviewed. Allergies and medications reviewed and updated.  Review of Systems  Per HPI unless specifically indicated above     Objective:    BP (!) 144/82 (BP Location: Left Arm, Cuff Size: Normal)   Pulse 73   Temp 97.7 F (36.5 C) (Oral)   Ht 5\' 1"  (1.549 m)   Wt 179 lb 3.2 oz (81.3 kg)   LMP  (LMP Unknown)   SpO2 99%   BMI 33.86 kg/m   Wt Readings from Last 3 Encounters:  04/23/18 179 lb 3.2 oz (81.3 kg)  03/29/18 179 lb 4.8 oz (81.3 kg)  01/22/18 181 lb 6.4 oz (82.3 kg)    Physical Exam  Constitutional: She is oriented to person, place, and time. She appears well-developed and well-nourished.  HENT:  Head: Normocephalic and atraumatic.  Eyes: Pupils are equal, round, and reactive to light.  Right eye exhibits no discharge. Left eye exhibits no discharge. No scleral icterus.  Neck: Normal range of motion. Neck supple. Carotid bruit is not present. No thyromegaly present.  Cardiovascular: Normal rate, regular rhythm and normal heart sounds. Exam reveals no gallop and no friction rub.  No murmur heard. Pulmonary/Chest: Effort normal and breath sounds normal. No respiratory distress. She has no wheezes. She has no rales. No breast tenderness or discharge.  Abdominal: Soft. Bowel sounds are normal. There is no tenderness. There is no rebound.  Genitourinary: No breast tenderness or discharge.  Musculoskeletal: Normal range of motion.  Lymphadenopathy:    She has no cervical adenopathy.  Neurological: She is alert and oriented to person, place, and time.  Skin: Skin is warm, dry and intact. No rash noted.  Psychiatric: She has a normal mood and affect. Her speech is normal and behavior is normal. Judgment and thought content normal. Cognition and memory are normal.    Results for orders placed or performed in visit on 03/29/18  UA/M w/rflx Culture, Routine  Result Value Ref Range   Specific Gravity, UA 1.010 1.005 - 1.030   pH, UA 6.0 5.0 - 7.5   Color, UA Yellow Yellow   Appearance Ur Clear Clear   Leukocytes, UA Negative Negative   Protein, UA Negative Negative/Trace   Glucose, UA  3+ (A) Negative   Ketones, UA Negative Negative   RBC, UA Negative Negative   Bilirubin, UA Negative Negative   Urobilinogen, Ur 0.2 0.2 - 1.0 mg/dL   Nitrite, UA Negative Negative  CBC With Differential/Platelet  Result Value Ref Range   WBC 10.1 3.4 - 10.8 x10E3/uL   RBC 4.66 3.77 - 5.28 x10E6/uL   Hemoglobin 14.7 11.1 - 15.9 g/dL   Hematocrit 41.2 34.0 - 46.6 %   MCV 88 79 - 97 fL   MCH 31.5 26.6 - 33.0 pg   MCHC 35.7 31.5 - 35.7 g/dL   RDW 14.0 12.3 - 15.4 %   Platelets 239 150 - 379 x10E3/uL   Neutrophils 67 Not Estab. %   Lymphs 24 Not Estab. %   MID 10 Not Estab. %   Neutrophils  Absolute 6.7 1.4 - 7.0 x10E3/uL   Lymphocytes Absolute 2.4 0.7 - 3.1 x10E3/uL   MID (Absolute) 1.0 0.1 - 1.6 X10E3/uL      Assessment & Plan:   Problem List Items Addressed This Visit      Unprioritized   Essential hypertension, benign    Not to goal today.  She will check more often. Increase Invokanna which may have a BP lowering effect      Hyperlipidemia    Stable, continue present medications.        Uncontrolled type 2 diabetes mellitus (HCC) - Primary    Hgb A1C is 8.2%.  Increase Invokana from 100 to 300 mg.  She will work on lifestyle.        Relevant Medications   canagliflozin (INVOKANA) 300 MG TABS tablet   Other Relevant Orders   Comprehensive metabolic panel   Bayer DCA Hb A1c Waived       Follow up plan: Return in about 3 months (around 07/24/2018).

## 2018-04-24 LAB — COMPREHENSIVE METABOLIC PANEL
ALT: 17 IU/L (ref 0–32)
AST: 20 IU/L (ref 0–40)
Albumin/Globulin Ratio: 2.5 — ABNORMAL HIGH (ref 1.2–2.2)
Albumin: 4.5 g/dL (ref 3.6–4.8)
Alkaline Phosphatase: 60 IU/L (ref 39–117)
BUN/Creatinine Ratio: 15 (ref 12–28)
BUN: 9 mg/dL (ref 8–27)
Bilirubin Total: 0.4 mg/dL (ref 0.0–1.2)
CO2: 21 mmol/L (ref 20–29)
Calcium: 9.5 mg/dL (ref 8.7–10.3)
Chloride: 105 mmol/L (ref 96–106)
Creatinine, Ser: 0.62 mg/dL (ref 0.57–1.00)
GFR calc Af Amer: 111 mL/min/{1.73_m2} (ref >59)
GFR calc non Af Amer: 96 mL/min/{1.73_m2} (ref >59)
Globulin, Total: 1.8 g/dL (ref 1.5–4.5)
Glucose: 150 mg/dL — ABNORMAL HIGH (ref 65–99)
Potassium: 3.7 mmol/L (ref 3.5–5.2)
Sodium: 141 mmol/L (ref 134–144)
Total Protein: 6.3 g/dL (ref 6.0–8.5)

## 2018-04-26 NOTE — Progress Notes (Signed)
Pt notified through mychart.

## 2018-05-05 ENCOUNTER — Telehealth: Payer: Self-pay

## 2018-05-05 ENCOUNTER — Telehealth: Payer: Self-pay | Admitting: Gastroenterology

## 2018-05-05 NOTE — Telephone Encounter (Signed)
Please call  Pt to reschedule colonoscopy on a Monday in July. 15,22 or 29 with Dr. Vicente Males

## 2018-05-05 NOTE — Telephone Encounter (Signed)
Returned patients call.   LVM for callback per msg: Please call  Pt to reschedule colonoscopy on a Monday in July. 15,22 or 29 with Dr. Vicente Males

## 2018-05-06 ENCOUNTER — Telehealth: Payer: Self-pay

## 2018-05-06 NOTE — Telephone Encounter (Signed)
LVM for patient callback to reschedule per phone message.   - Please call  Pt to reschedule colonoscopy on a Monday in July. 15,22 or 29 with Dr. Vicente Males

## 2018-06-05 ENCOUNTER — Other Ambulatory Visit: Payer: Self-pay | Admitting: Unknown Physician Specialty

## 2018-06-05 DIAGNOSIS — E785 Hyperlipidemia, unspecified: Secondary | ICD-10-CM

## 2018-06-10 ENCOUNTER — Other Ambulatory Visit: Payer: Self-pay | Admitting: Unknown Physician Specialty

## 2018-06-16 ENCOUNTER — Encounter: Payer: Self-pay | Admitting: Family Medicine

## 2018-06-16 ENCOUNTER — Ambulatory Visit: Payer: BLUE CROSS/BLUE SHIELD | Admitting: Family Medicine

## 2018-06-16 VITALS — BP 109/68 | HR 82 | Temp 98.2°F | Ht 61.0 in | Wt 179.0 lb

## 2018-06-16 DIAGNOSIS — R300 Dysuria: Secondary | ICD-10-CM | POA: Diagnosis not present

## 2018-06-16 LAB — UA/M W/RFLX CULTURE, ROUTINE
Bilirubin, UA: NEGATIVE
Ketones, UA: NEGATIVE
Leukocytes, UA: NEGATIVE
Nitrite, UA: NEGATIVE
Protein, UA: NEGATIVE
Specific Gravity, UA: 1.01 (ref 1.005–1.030)
Urobilinogen, Ur: 0.2 mg/dL (ref 0.2–1.0)
pH, UA: 5.5 (ref 5.0–7.5)

## 2018-06-16 LAB — MICROSCOPIC EXAMINATION

## 2018-06-16 NOTE — Patient Instructions (Signed)
Follow up as needed

## 2018-06-16 NOTE — Progress Notes (Signed)
BP 109/68 (BP Location: Right Arm, Patient Position: Sitting, Cuff Size: Normal)   Pulse 82   Temp 98.2 F (36.8 C) (Oral)   Ht 5\' 1"  (1.549 m)   Wt 179 lb (81.2 kg)   LMP  (LMP Unknown)   SpO2 96%   BMI 33.82 kg/m    Subjective:    Patient ID: Christine Booth, female    DOB: 1956-01-24, 62 y.o.   MRN: [id number]  HPI: Christine Booth is a 62 y.o. female  Chief Complaint  Patient presents with  . Dysuria   Dysuria and frequency x 2 days. Denies fever, chills, N/V/D, abdominal pain, back pain. Hx of UTIs. Has not tried any AZO or other OTC remedies.   Relevant past medical, surgical, family and social history reviewed and updated as indicated. Interim medical history since our last visit reviewed. Allergies and medications reviewed and updated.  Review of Systems  Per HPI unless specifically indicated above     Objective:    BP 109/68 (BP Location: Right Arm, Patient Position: Sitting, Cuff Size: Normal)   Pulse 82   Temp 98.2 F (36.8 C) (Oral)   Ht 5\' 1"  (1.549 m)   Wt 179 lb (81.2 kg)   LMP  (LMP Unknown)   SpO2 96%   BMI 33.82 kg/m   Wt Readings from Last 3 Encounters:  06/16/18 179 lb (81.2 kg)  04/23/18 179 lb 3.2 oz (81.3 kg)  03/29/18 179 lb 4.8 oz (81.3 kg)    Physical Exam  Constitutional: She is oriented to person, place, and time. She appears well-developed and well-nourished. No distress.  HENT:  Head: Atraumatic.  Eyes: Pupils are equal, round, and reactive to light. Conjunctivae are normal.  Neck: Normal range of motion. Neck supple.  Cardiovascular: Normal rate and regular rhythm.  Pulmonary/Chest: Effort normal.  Abdominal: Soft. Bowel sounds are normal. She exhibits no distension. There is no tenderness.  Musculoskeletal: Normal range of motion. She exhibits no tenderness (no CVA tenderness b/l).  Neurological: She is alert and oriented to person, place, and time.  Skin: Skin is warm and dry.  Psychiatric: She has a normal mood  and affect. Her behavior is normal.  Nursing note and vitals reviewed.  Results for orders placed or performed in visit on 04/23/18  Comprehensive metabolic panel  Result Value Ref Range   Glucose 150 (H) 65 - 99 mg/dL   BUN 9 8 - 27 mg/dL   Creatinine, Ser 0.62 0.57 - 1.00 mg/dL   GFR calc non Af Amer 96 >59 mL/min/1.73   GFR calc Af Amer 111 >59 mL/min/1.73   BUN/Creatinine Ratio 15 12 - 28   Sodium 141 134 - 144 mmol/L   Potassium 3.7 3.5 - 5.2 mmol/L   Chloride 105 96 - 106 mmol/L   CO2 21 20 - 29 mmol/L   Calcium 9.5 8.7 - 10.3 mg/dL   Total Protein 6.3 6.0 - 8.5 g/dL   Albumin 4.5 3.6 - 4.8 g/dL   Globulin, Total 1.8 1.5 - 4.5 g/dL   Albumin/Globulin Ratio 2.5 (H) 1.2 - 2.2   Bilirubin Total 0.4 0.0 - 1.2 mg/dL   Alkaline Phosphatase 60 39 - 117 IU/L   AST 20 0 - 40 IU/L   ALT 17 0 - 32 IU/L  Bayer DCA Hb A1c Waived  Result Value Ref Range   HB A1C (BAYER DCA - WAIVED) 8.2 (H) <7.0 %      Assessment & Plan:   Problem  List Items Addressed This Visit    None    Visit Diagnoses    Dysuria    -  Primary   U/A neg, push fluids, probiotics, cranberry. F/u if sxs worsen, will order a repeat U/A in that case   Relevant Orders   UA/M w/rflx Culture, Routine       Follow up plan: Return if symptoms worsen or fail to improve.

## 2018-06-25 ENCOUNTER — Encounter: Payer: Self-pay | Admitting: Unknown Physician Specialty

## 2018-07-27 ENCOUNTER — Ambulatory Visit: Payer: BLUE CROSS/BLUE SHIELD | Admitting: Unknown Physician Specialty

## 2018-07-27 ENCOUNTER — Other Ambulatory Visit: Payer: Self-pay

## 2018-07-27 ENCOUNTER — Ambulatory Visit: Payer: BLUE CROSS/BLUE SHIELD | Admitting: Physician Assistant

## 2018-07-27 ENCOUNTER — Encounter: Payer: Self-pay | Admitting: Physician Assistant

## 2018-07-27 VITALS — BP 104/69 | HR 78 | Temp 98.6°F | Ht 61.0 in | Wt 177.0 lb

## 2018-07-27 DIAGNOSIS — Z23 Encounter for immunization: Secondary | ICD-10-CM | POA: Diagnosis not present

## 2018-07-27 DIAGNOSIS — I129 Hypertensive chronic kidney disease with stage 1 through stage 4 chronic kidney disease, or unspecified chronic kidney disease: Secondary | ICD-10-CM

## 2018-07-27 DIAGNOSIS — E1165 Type 2 diabetes mellitus with hyperglycemia: Secondary | ICD-10-CM

## 2018-07-27 DIAGNOSIS — E785 Hyperlipidemia, unspecified: Secondary | ICD-10-CM

## 2018-07-27 LAB — BAYER DCA HB A1C WAIVED: HB A1C (BAYER DCA - WAIVED): 7.8 % — ABNORMAL HIGH (ref <7.0)

## 2018-07-27 MED ORDER — SEMAGLUTIDE(0.25 OR 0.5MG/DOS) 2 MG/1.5ML ~~LOC~~ SOPN: 0.2500 mg | PEN_INJECTOR | SUBCUTANEOUS | 0 refills | Status: DC

## 2018-07-27 MED ORDER — SEMAGLUTIDE(0.25 OR 0.5MG/DOS) 2 MG/1.5ML ~~LOC~~ SOPN: 0.7500 mg | PEN_INJECTOR | SUBCUTANEOUS | 1 refills | Status: DC

## 2018-07-27 MED ORDER — SEMAGLUTIDE(0.25 OR 0.5MG/DOS) 2 MG/1.5ML ~~LOC~~ SOPN: 0.2500 mg | PEN_INJECTOR | SUBCUTANEOUS | 3 refills | Status: DC

## 2018-07-27 NOTE — Progress Notes (Signed)
   Subjective:    Patient ID: Christine Booth, female    DOB: 02-14-1956, 62 y.o.   MRN: [id number]  Christine Booth is a 62 y.o. female presenting on 07/27/2018 for Diabetes (79mf/u)   HPI   DM II: Currently on onglyza 5 mg daily. Invokana was increased from 100 mg daily to 300 mg daily last visit. Last A1c in may was 8.2%. Previously on metformin but did not tolerate due to Gi side effects. A1c today is 7.5%.  Wt Readings from Last 3 Encounters:  07/27/18 177 lb (80.3 kg)  06/16/18 179 lb (81.2 kg)  04/23/18 179 lb 3.2 oz (81.3 kg)     HTN: Hyzaar 100-12.5 mg daily, tolerating well.   BP Readings from Last 3 Encounters:  07/27/18 104/69  06/16/18 109/68  04/23/18 (!) 144/82   HLD: Currently taking Pravachol 40 mg, cholesterol stable.   Lipid Panel     Component Value Date/Time   CHOL 157 01/22/2018 0829   CHOL 149 09/04/2015 0855   TRIG 132 01/22/2018 0829   TRIG 135 09/04/2015 0855   HDL 48 01/22/2018 0829   VLDL 27 09/04/2015 0855   LDLCALC 83 01/22/2018 0829      Social History   Tobacco Use  . Smoking status: Never Smoker  . Smokeless tobacco: Never Used  Substance Use Topics  . Alcohol use: No    Alcohol/week: 0.0 standard drinks  . Drug use: No    Review of Systems Per HPI unless specifically indicated above     Objective:    BP 104/69   Pulse 78   Temp 98.6 F (37 C) (Oral)   Ht _0  (1.549 m)   Wt 177 lb (80.3 kg)   LMP  (LMP Unknown)   SpO2 96%   BMI 33.44 kg/m   Wt Readings from Last 3 Encounters:  07/27/18 177 lb (80.3 kg)  06/16/18 179 lb (81.2 kg)  04/23/18 179 lb 3.2 oz (81.3 kg)    Physical Exam  Constitutional: She is oriented to person, place, and time. She appears well-developed and well-nourished.  Cardiovascular: Normal rate and regular rhythm.  Pulmonary/Chest: Effort normal and breath sounds normal.  Neurological: She is alert and oriented to person, place, and time.  Skin: Skin is warm and dry.    Psychiatric: She has a normal mood and affect. Her behavior is normal.   Results for orders placed or performed in visit on 07/27/18  Bayer DCA Hb A1c Waived (STAT)  Result Value Ref Range   HB A1C (BAYER DCA - WAIVED) 7.8 (H) <7.0 %      Assessment & Plan:  1. Uncontrolled type 2 diabetes mellitus with hyperglycemia (HCC)  A1c not to goal. Started ozempic today in office. First dose today in office. Follow up one month. Please check fasting blood sugars so we can trend.   - Bayer DCA Hb A1c Waived (STAT) - Comp Met (CMET) - Semaglutide (OZEMPIC) 0.25 or 0.5 MG/DOSE SOPN; Inject 0.75 mg into the skin once a week.  Dispense: 3 mL; Refill: 1  2. Flu vaccine need   3. Benign hypertension with chronic kidney disease  Continue meds.   4. Hyperlipidemia, unspecified hyperlipidemia type  Continue meds.    Follow up plan: Return in about 1 month (around 08/27/2018) for follow up DM ozempic .  ACarles Collet PA-C [facility] 07/27/2018, 5:06 PM

## 2018-07-27 NOTE — Patient Instructions (Addendum)

## 2018-07-28 LAB — COMPREHENSIVE METABOLIC PANEL
ALT: 19 IU/L (ref 0–32)
AST: 18 IU/L (ref 0–40)
Albumin/Globulin Ratio: 2.1 (ref 1.2–2.2)
Albumin: 4.7 g/dL (ref 3.6–4.8)
Alkaline Phosphatase: 67 IU/L (ref 39–117)
BUN/Creatinine Ratio: 19 (ref 12–28)
BUN: 14 mg/dL (ref 8–27)
Bilirubin Total: 0.6 mg/dL (ref 0.0–1.2)
CO2: 24 mmol/L (ref 20–29)
Calcium: 9.9 mg/dL (ref 8.7–10.3)
Chloride: 99 mmol/L (ref 96–106)
Creatinine, Ser: 0.73 mg/dL (ref 0.57–1.00)
GFR calc Af Amer: 101 mL/min/{1.73_m2} (ref >59)
GFR calc non Af Amer: 88 mL/min/{1.73_m2} (ref >59)
Globulin, Total: 2.2 g/dL (ref 1.5–4.5)
Glucose: 172 mg/dL — ABNORMAL HIGH (ref 65–99)
Potassium: 3.9 mmol/L (ref 3.5–5.2)
Sodium: 139 mmol/L (ref 134–144)
Total Protein: 6.9 g/dL (ref 6.0–8.5)

## 2018-08-04 ENCOUNTER — Other Ambulatory Visit: Payer: Self-pay | Admitting: Unknown Physician Specialty

## 2018-08-04 DIAGNOSIS — I129 Hypertensive chronic kidney disease with stage 1 through stage 4 chronic kidney disease, or unspecified chronic kidney disease: Secondary | ICD-10-CM

## 2018-08-31 ENCOUNTER — Ambulatory Visit: Payer: BLUE CROSS/BLUE SHIELD | Admitting: Physician Assistant

## 2018-08-31 ENCOUNTER — Encounter: Payer: Self-pay | Admitting: Physician Assistant

## 2018-08-31 VITALS — BP 113/78 | HR 73 | Temp 98.5°F | Ht 61.0 in | Wt 178.4 lb

## 2018-08-31 DIAGNOSIS — I10 Essential (primary) hypertension: Secondary | ICD-10-CM | POA: Diagnosis not present

## 2018-08-31 DIAGNOSIS — E1165 Type 2 diabetes mellitus with hyperglycemia: Secondary | ICD-10-CM

## 2018-08-31 DIAGNOSIS — Z23 Encounter for immunization: Secondary | ICD-10-CM | POA: Diagnosis not present

## 2018-08-31 MED ORDER — LOSARTAN POTASSIUM 100 MG PO TABS: 100.0000 mg | ORAL_TABLET | Freq: Every day | ORAL | 0 refills | Status: DC

## 2018-08-31 MED ORDER — HYDROCHLOROTHIAZIDE 12.5 MG PO TABS: 12.5000 mg | ORAL_TABLET | Freq: Every day | ORAL | 0 refills | Status: DC

## 2018-08-31 MED ORDER — SEMAGLUTIDE(0.25 OR 0.5MG/DOS) 2 MG/1.5ML ~~LOC~~ SOPN: 0.5000 mg | PEN_INJECTOR | SUBCUTANEOUS | 3 refills | Status: DC

## 2018-08-31 NOTE — Patient Instructions (Signed)

## 2018-08-31 NOTE — Progress Notes (Signed)
Subjective:    Patient ID: Christine Booth, female    DOB: [birth year], 62 y.o.   MRN: [id number]  Christine Booth is a 62 y.o. female presenting on 08/31/2018 for Diabetes (1 month f/up- started ozempic last visit)   HPI   DM: Started ozempic 0.25 mg subQ once weekly last visit. She is doing well with this. No nausea or vomiting. Continues to take invokana 300 mg daily and Ongylza 5 mg daily. She is intolerant to metformin. Reports her fasting sugars have come down from > 200's into the 190's.   Wt Readings from Last 3 Encounters:  08/31/18 178 lb 6.4 oz (80.9 kg)  07/27/18 177 lb (80.3 kg)  06/16/18 179 lb (81.2 kg)   HTN: Also reports there was a recall on batches of losartan and that her pharamcy is out of her combination losartan and HCTZ. Pharmacist suggested she get new Rx to split the medication.   Social History   Tobacco Use  . Smoking status: Never Smoker  . Smokeless tobacco: Never Used  Substance Use Topics  . Alcohol use: No    Alcohol/week: 0.0 standard drinks  . Drug use: No    Review of Systems Per HPI unless specifically indicated above     Objective:    BP 113/78   Pulse 73   Temp 98.5 F (36.9 C) (Oral)   Ht 5' 1"  (1.549 m)   Wt 178 lb 6.4 oz (80.9 kg)   LMP  (LMP Unknown)   SpO2 98%   BMI 33.71 kg/m   Wt Readings from Last 3 Encounters:  08/31/18 178 lb 6.4 oz (80.9 kg)  07/27/18 177 lb (80.3 kg)  06/16/18 179 lb (81.2 kg)    Physical Exam  Constitutional: She appears well-developed and well-nourished.  Cardiovascular: Normal rate and regular rhythm.  Pulmonary/Chest: Effort normal and breath sounds normal.  Skin: Skin is warm and dry.  Psychiatric: She has a normal mood and affect. Her behavior is normal.   Results for orders placed or performed in visit on 07/27/18  Bayer DCA Hb A1c Waived (STAT)  Result Value Ref Range   HB A1C (BAYER DCA - WAIVED) 7.8 (H) <7.0 %  Comp Met (CMET)  Result Value Ref Range   Glucose 172 (H)  65 - 99 mg/dL   BUN 14 8 - 27 mg/dL   Creatinine, Ser 0.73 0.57 - 1.00 mg/dL   GFR calc non Af Amer 88 >59 mL/min/1.73   GFR calc Af Amer 101 >59 mL/min/1.73   BUN/Creatinine Ratio 19 12 - 28   Sodium 139 134 - 144 mmol/L   Potassium 3.9 3.5 - 5.2 mmol/L   Chloride 99 96 - 106 mmol/L   CO2 24 20 - 29 mmol/L   Calcium 9.9 8.7 - 10.3 mg/dL   Total Protein 6.9 6.0 - 8.5 g/dL   Albumin 4.7 3.6 - 4.8 g/dL   Globulin, Total 2.2 1.5 - 4.5 g/dL   Albumin/Globulin Ratio 2.1 1.2 - 2.2   Bilirubin Total 0.6 0.0 - 1.2 mg/dL   Alkaline Phosphatase 67 39 - 117 IU/L   AST 18 0 - 40 IU/L   ALT 19 0 - 32 IU/L      Assessment & Plan:  1. Uncontrolled type 2 diabetes mellitus with hyperglycemia (HCC)  Will increase to 0.5 mg subQ daily. Will have her follow up in 2 months for DM check.  - Semaglutide,0.25 or 0.5MG/DOS, (OZEMPIC, 0.25 OR 0.5 MG/DOSE,) 2 MG/1.5ML SOPN; Inject  0.5 mg into the skin once a week.  Dispense: 1 pen; Refill: 3 - losartan (COZAAR) 100 MG tablet; Take 1 tablet (100 mg total) by mouth daily.  Dispense: 90 tablet; Refill: 0 - hydrochlorothiazide (HYDRODIURIL) 12.5 MG tablet; Take 1 tablet (12.5 mg total) by mouth daily.  Dispense: 90 tablet; Refill: 0  2. Essential hypertension, benign  Split losartan and HCTZ.  - losartan (COZAAR) 100 MG tablet; Take 1 tablet (100 mg total) by mouth daily.  Dispense: 90 tablet; Refill: 0 - hydrochlorothiazide (HYDRODIURIL) 12.5 MG tablet; Take 1 tablet (12.5 mg total) by mouth daily.  Dispense: 90 tablet; Refill: 0  3. Need for influenza vaccination  - Flu Vaccine QUAD 36+ mos IM  Return in about 2 months (around 10/31/2018) for DM, HTN.     Carles Collet, PA-C [facility] 09/01/2018, 1:04 PM

## 2018-10-17 ENCOUNTER — Other Ambulatory Visit: Payer: Self-pay | Admitting: Unknown Physician Specialty

## 2018-10-18 NOTE — Telephone Encounter (Signed)
Requested Prescriptions  Pending Prescriptions Disp Refills  . saxagliptin HCl (ONGLYZA) 5 MG TABS tablet [Pharmacy Med Name: ONGLYZA 5 MG TABLET] 90 tablet 1    Sig: TAKE 1 TABLET BY MOUTH EVERY DAY     Endocrinology:  Diabetes - DPP-4 Inhibitors Passed - 10/17/2018  1:19 AM      Passed - HBA1C is between 0 and 7.9 and within 180 days    Hgb A1c MFr Bld  Date Value Ref Range Status  12/11/2015 7.5 (H) 4.8 - 5.6 % Final         Passed - Cr in normal range and within 360 days    Creatinine, Ser  Date Value Ref Range Status  07/27/2018 0.73 0.57 - 1.00 mg/dL Final         Passed - Valid encounter within last 6 months    Recent Outpatient Visits          1 month ago Uncontrolled type 2 diabetes mellitus with hyperglycemia [facility])   Humphrey, Adriana M, PA-C   2 months ago Uncontrolled type 2 diabetes mellitus with hyperglycemia [facility])   [facility] Trinna Post, [place]   4 months ago [place], [place], [place]   5 months ago Uncontrolled type 2 diabetes mellitus with hyperglycemia [facility])   [facility] Kathrine Haddock, NP   6 months ago Lower abdominal pain   [facility] Kathrine Haddock, NP      Future Appointments            In 2 weeks Cannady, Barbaraann Faster, NP [employer], [facility]

## 2018-11-01 ENCOUNTER — Other Ambulatory Visit: Payer: Self-pay

## 2018-11-01 ENCOUNTER — Ambulatory Visit (INDEPENDENT_AMBULATORY_CARE_PROVIDER_SITE_OTHER): Payer: BLUE CROSS/BLUE SHIELD | Admitting: Nurse Practitioner

## 2018-11-01 ENCOUNTER — Encounter: Payer: Self-pay | Admitting: Nurse Practitioner

## 2018-11-01 VITALS — BP 108/71 | HR 86 | Temp 97.8°F | Ht 63.0 in | Wt 173.4 lb

## 2018-11-01 DIAGNOSIS — E1165 Type 2 diabetes mellitus with hyperglycemia: Secondary | ICD-10-CM

## 2018-11-01 DIAGNOSIS — L2089 Other atopic dermatitis: Secondary | ICD-10-CM

## 2018-11-01 DIAGNOSIS — I10 Essential (primary) hypertension: Secondary | ICD-10-CM | POA: Diagnosis not present

## 2018-11-01 DIAGNOSIS — L209 Atopic dermatitis, unspecified: Secondary | ICD-10-CM | POA: Insufficient documentation

## 2018-11-01 DIAGNOSIS — N611 Abscess of the breast and nipple: Secondary | ICD-10-CM | POA: Diagnosis not present

## 2018-11-01 LAB — BAYER DCA HB A1C WAIVED: HB A1C (BAYER DCA - WAIVED): 6.9 % (ref <7.0)

## 2018-11-01 MED ORDER — TRIAMCINOLONE ACETONIDE 0.025 % EX CREA: 1.0000 "application " | TOPICAL_CREAM | Freq: Two times a day (BID) | CUTANEOUS | 0 refills | Status: DC

## 2018-11-01 MED ORDER — MUPIROCIN 2 % EX OINT: 1.0000 "application " | TOPICAL_OINTMENT | Freq: Two times a day (BID) | CUTANEOUS | 0 refills | Status: DC

## 2018-11-01 NOTE — Progress Notes (Signed)
BP 108/71   Pulse 86   Temp 97.8 F (36.6 C) (Oral)   Ht 5' 3" (1.6 m)   Wt 173 lb 6.4 oz (78.7 kg)   LMP  (LMP Unknown)   SpO2 98%   BMI 30.72 kg/m    Subjective:    Patient ID: Christine Booth, female    DOB: 04-Apr-1956, 62 y.o.   MRN: [id number]  HPI: [name] is a 62 y.o. female presents for follow-up T2DM and HTN  Chief Complaint  Patient presents with  . Diabetes    66mf/u  . Hypertension   DIABETES Last A1C in August was 7.8%.  Reports she does not "do as well as I should" at home with diet.  She endorses enjoying sweets, drinks diet sodas.  At last visit Ozempic increased to 0.5 MG weekly.  She does endorse some constipation with change in regimen.  Discussed various OTC medications she can use to assist with this, such as Miralax and Colace. Hypoglycemic episodes:no Polydipsia/polyuria: no Visual disturbance: no Chest pain: no Paresthesias: no Glucose Monitoring: yes  Accucheck frequency: twice a week  Fasting glucose: 165  Post prandial:  Evening:  Before meals: Taking Insulin?: no  Long acting insulin:  Short acting insulin: Blood Pressure Monitoring: not checking Retinal Examination: Up to Date Foot Exam: Up to Date Diabetic Education: Not Completed Pneumovax: Up to Date Influenza: Up to Date Aspirin: yes   HYPERTENSION Does not take BP at home.  Continues on Losartan. Hypertension status: controlled  Satisfied with current treatment? yes Duration of hypertension: chronic BP monitoring frequency:  not checking BP range:  BP medication side effects:  no Medication compliance: excellent compliance Previous BP meds:none Aspirin: yes Recurrent headaches: no Visual changes: no Palpitations: no Dyspnea: no Chest pain: no Lower extremity edema: no Dizzy/lightheaded: no   ATOPIC DERMATITIS: Reports dry hands all the time.  Has an area to her right thumb, which is having waxing and waning flares of erythema with dry, flaking  skin.  She reports mild pruritus to area when flare present.  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  Has sensitive skin at baseline per her report and uses sensitive soaps at home, but not at workplace.  BOIL UNDER LEFT BREAST:  Started as "little bump" for several weeks and then "all of sudden flared up".  Has been "flared up" for two weeks.  Doing warm compresses once a day and area drained some and is improving at this time per patient report.  No longer sensitive and "hard" to touch.  Reports no pain to area.  Has been applying Neosporin at home and a bandage to area, which she reports the bandage has caused erythema around site due to her skin sensitivities.  Denies fever, odor, or any further drainage.  Social History   Socioeconomic History  . Marital status: Divorced    Spouse name: Not on file  . Number of children: Not on file  . Years of education: Not on file  . Highest education level: Not on file  Occupational History  . Not on file  Social Needs  . Financial resource strain: Not on file  . Food insecurity:    Worry: Not on file    Inability: Not on file  . Transportation needs:    Medical: Not on file    Non-medical: Not on file  Tobacco Use  . Smoking status: Never Smoker  . Smokeless tobacco:  Never Used  Substance and Sexual Activity  . Alcohol use: No    Alcohol/week: 0.0 standard drinks  . Drug use: No  . Sexual activity: Never  Lifestyle  . Physical activity:    Days per week: Not on file    Minutes per session: Not on file  . Stress: Not on file  Relationships  . Social connections:    Talks on phone: Not on file    Gets together: Not on file    Attends religious service: Not on file    Active member of club or organization: Not on file    Attends meetings of clubs or organizations: Not on file    Relationship status: Not on file  . Intimate partner violence:    Fear of current or ex partner: Not on file     Emotionally abused: Not on file    Physically abused: Not on file    Forced sexual activity: Not on file  Other Topics Concern  . Not on file  Social History Narrative  . Not on file   Relevant past medical, surgical, family and social history reviewed and updated as indicated. Interim medical history since our last visit reviewed. Allergies and medications reviewed and updated.  Review of Systems  Constitutional: Negative for activity change, appetite change, diaphoresis, fatigue and fever.  Respiratory: Negative for cough, chest tightness and shortness of breath.   Cardiovascular: Negative for chest pain, palpitations and leg swelling.  Gastrointestinal: Positive for constipation. Negative for abdominal distention, abdominal pain, diarrhea, nausea and vomiting.  Endocrine: Negative for cold intolerance, heat intolerance, polydipsia, polyphagia and polyuria.  Musculoskeletal: Negative.   Skin: Positive for rash.       Boil under left breast  Neurological: Negative for dizziness, syncope, weakness, light-headedness, numbness and headaches.  Psychiatric/Behavioral: Negative.     Per HPI unless specifically indicated above     Objective:    BP 108/71   Pulse 86   Temp 97.8 F (36.6 C) (Oral)   Ht 5' 3" (1.6 m)   Wt 173 lb 6.4 oz (78.7 kg)   LMP  (LMP Unknown)   SpO2 98%   BMI 30.72 kg/m   Wt Readings from Last 3 Encounters:  11/01/18 173 lb 6.4 oz (78.7 kg)  08/31/18 178 lb 6.4 oz (80.9 kg)  07/27/18 177 lb (80.3 kg)    Physical Exam  Constitutional: She is oriented to person, place, and time. She appears well-developed and well-nourished.  HENT:  Head: Normocephalic.  Eyes: Pupils are equal, round, and reactive to light. Conjunctivae and EOM are normal. Right eye exhibits no discharge. Left eye exhibits no discharge.  Neck: Normal range of motion. Neck supple. No JVD present. Carotid bruit is not present. No thyromegaly present.  Cardiovascular: Normal rate,  regular rhythm and normal heart sounds.  Pulmonary/Chest: Effort normal and breath sounds normal.  Abdominal: Soft. Bowel sounds are normal.  Lymphadenopathy:    She has no cervical adenopathy.  Neurological: She is alert and oriented to person, place, and time.  Skin: Skin is warm and dry.  Small area of erythema with extending with PIP to MCP right dorsal thumb with dry flaky skin, no warmth or drainage.  Area under left breast with small pinpoint area of crusting with no drainage, edema, or warmth; mild erythema extending around area from application of bandage to site (encouraged her not to place bandage to area d/t sensitivity to it).  No tenderness to palpation.  Psychiatric: She has a  normal mood and affect. Her behavior is normal. Judgment and thought content normal.  Nursing note and vitals reviewed.   Results for orders placed or performed in visit on 07/27/18  Bayer DCA Hb A1c Waived (STAT)  Result Value Ref Range   HB A1C (BAYER DCA - WAIVED) 7.8 (H) <7.0 %  Comp Met (CMET)  Result Value Ref Range   Glucose 172 (H) 65 - 99 mg/dL   BUN 14 8 - 27 mg/dL   Creatinine, Ser 0.73 0.57 - 1.00 mg/dL   GFR calc non Af Amer 88 >59 mL/min/1.73   GFR calc Af Amer 101 >59 mL/min/1.73   BUN/Creatinine Ratio 19 12 - 28   Sodium 139 134 - 144 mmol/L   Potassium 3.9 3.5 - 5.2 mmol/L   Chloride 99 96 - 106 mmol/L   CO2 24 20 - 29 mmol/L   Calcium 9.9 8.7 - 10.3 mg/dL   Total Protein 6.9 6.0 - 8.5 g/dL   Albumin 4.7 3.6 - 4.8 g/dL   Globulin, Total 2.2 1.5 - 4.5 g/dL   Albumin/Globulin Ratio 2.1 1.2 - 2.2   Bilirubin Total 0.6 0.0 - 1.2 mg/dL   Alkaline Phosphatase 67 39 - 117 IU/L   AST 18 0 - 40 IU/L   ALT 19 0 - 32 IU/L      Assessment & Plan:   Problem List Items Addressed This Visit      Cardiovascular and Mediastinum   Essential hypertension, benign    Chronic, stable with BP at goal today.  Continue current medication regimen.  BMP next visit.        Endocrine    Uncontrolled type 2 diabetes mellitus ([place]) - Primary    Chronic, ongoing. August A1C 7.8% and today A1C 6.9%.  Continue current regimen.  Recommend use of Colace and Miralax for constipation with medication.      Relevant Orders   Bayer DCA Hb A1c Waived (STAT)     Musculoskeletal and Integument   Boil, breast    Acute, improving under left breast.  Continue warm compresses three times a day to encourage further drainage of area and apply Mupirocin ointment twice a day.  If worsening with erythema, pain, or edema return to office immediately.      Relevant Medications   mupirocin ointment (BACTROBAN) 2 %   Atopic dermatitis    Present with waxing and waning to right thumb.  Triamcinolone 0.025% cream ordered to apply two times daily during flares.  Do not get near eyes.  Use sensitive/gentle soap for washing hands.          Follow up plan: Return in about 3 months (around 02/01/2019) for T2DM and HTN.

## 2018-11-01 NOTE — Assessment & Plan Note (Signed)
Chronic, stable with BP at goal today.  Continue current medication regimen.  BMP next visit.

## 2018-11-01 NOTE — Assessment & Plan Note (Signed)
Present with waxing and waning to right thumb.  Triamcinolone 0.025% cream ordered to apply two times daily during flares.  Do not get near eyes.  Use sensitive/gentle soap for washing hands.

## 2018-11-01 NOTE — Assessment & Plan Note (Signed)
Acute, improving under left breast.  Continue warm compresses three times a day to encourage further drainage of area and apply Mupirocin ointment twice a day.  If worsening with erythema, pain, or edema return to office immediately.

## 2018-11-01 NOTE — Patient Instructions (Addendum)
May take Colace 100 MG by mouth twice a day and Miralax daily for constipation.  Diabetes Mellitus and Nutrition When you have diabetes (diabetes mellitus), it is very important to have healthy eating habits because your blood sugar (glucose) levels are greatly affected by what you eat and drink. Eating healthy foods in the appropriate amounts, at about the same times every day, can help you:  Control your blood glucose.  Lower your risk of heart disease.  Improve your blood pressure.  Reach or maintain a healthy weight.  Every person with diabetes is different, and each person has different needs for a meal plan. Your health care provider may recommend that you work with a diet and nutrition specialist (dietitian) to make a meal plan that is best for you. Your meal plan may vary depending on factors such as:  The calories you need.  The medicines you take.  Your weight.  Your blood glucose, blood pressure, and cholesterol levels.  Your activity level.  Other health conditions you have, such as heart or kidney disease.  How do carbohydrates affect me? Carbohydrates affect your blood glucose level more than any other type of food. Eating carbohydrates naturally increases the amount of glucose in your blood. Carbohydrate counting is a method for keeping track of how many carbohydrates you eat. Counting carbohydrates is important to keep your blood glucose at a healthy level, especially if you use insulin or take certain oral diabetes medicines. It is important to know how many carbohydrates you can safely have in each meal. This is different for every person. Your dietitian can help you calculate how many carbohydrates you should have at each meal and for snack. Foods that contain carbohydrates include:  Bread, cereal, rice, pasta, and crackers.  Potatoes and corn.  Peas, beans, and lentils.  Milk and yogurt.  Fruit and juice.  Desserts, such as cakes, cookies, ice cream, and  candy.  How does alcohol affect me? Alcohol can cause a sudden decrease in blood glucose (hypoglycemia), especially if you use insulin or take certain oral diabetes medicines. Hypoglycemia can be a life-threatening condition. Symptoms of hypoglycemia (sleepiness, dizziness, and confusion) are similar to symptoms of having too much alcohol. If your health care provider says that alcohol is safe for you, follow these guidelines:  Limit alcohol intake to no more than 1 drink per day for nonpregnant women and 2 drinks per day for men. One drink equals 12 oz of beer, 5 oz of wine, or 1 oz of hard liquor.  Do not drink on an empty stomach.  Keep yourself hydrated with water, diet soda, or unsweetened iced tea.  Keep in mind that regular soda, juice, and other mixers may contain a lot of sugar and must be counted as carbohydrates.  What are tips for following this plan? Reading food labels  Start by checking the serving size on the label. The amount of calories, carbohydrates, fats, and other nutrients listed on the label are based on one serving of the food. Many foods contain more than one serving per package.  Check the total grams (g) of carbohydrates in one serving. You can calculate the number of servings of carbohydrates in one serving by dividing the total carbohydrates by 15. For example, if a food has 30 g of total carbohydrates, it would be equal to 2 servings of carbohydrates.  Check the number of grams (g) of saturated and trans fats in one serving. Choose foods that have low or no amount of  these fats.  Check the number of milligrams (mg) of sodium in one serving. Most people should limit total sodium intake to less than 2,300 mg per day.  Always check the nutrition information of foods labeled as "low-fat" or "nonfat". These foods may be higher in added sugar or refined carbohydrates and should be avoided.  Talk to your dietitian to identify your daily goals for nutrients listed  on the label. Shopping  Avoid buying canned, premade, or processed foods. These foods tend to be high in fat, sodium, and added sugar.  Shop around the outside edge of the grocery store. This includes fresh fruits and vegetables, bulk grains, fresh meats, and fresh dairy. Cooking  Use low-heat cooking methods, such as baking, instead of high-heat cooking methods like deep frying.  Cook using healthy oils, such as olive, canola, or sunflower oil.  Avoid cooking with butter, cream, or high-fat meats. Meal planning  Eat meals and snacks regularly, preferably at the same times every day. Avoid going long periods of time without eating.  Eat foods high in fiber, such as fresh fruits, vegetables, beans, and whole grains. Talk to your dietitian about how many servings of carbohydrates you can eat at each meal.  Eat 4-6 ounces of lean protein each day, such as lean meat, chicken, fish, eggs, or tofu. 1 ounce is equal to 1 ounce of meat, chicken, or fish, 1 egg, or 1/4 cup of tofu.  Eat some foods each day that contain healthy fats, such as avocado, nuts, seeds, and fish. Lifestyle   Check your blood glucose regularly.  Exercise at least 30 minutes 5 or more days each week, or as told by your health care provider.  Take medicines as told by your health care provider.  Do not use any products that contain nicotine or tobacco, such as cigarettes and e-cigarettes. If you need help quitting, ask your health care provider.  Work with a Social worker or diabetes educator to identify strategies to manage stress and any emotional and social challenges. What are some questions to ask my health care provider?  Do I need to meet with a diabetes educator?  Do I need to meet with a dietitian?  What number can I call if I have questions?  When are the best times to check my blood glucose? Where to find more information:  American Diabetes Association: diabetes.org/food-and-fitness/food  Academy  of Nutrition and Dietetics: PokerClues.dk  [employer] of Diabetes and Digestive and Kidney Diseases (NIH): ContactWire.be Summary  A healthy meal plan will help you control your blood glucose and maintain a healthy lifestyle.  Working with a diet and nutrition specialist (dietitian) can help you make a meal plan that is best for you.  Keep in mind that carbohydrates and alcohol have immediate effects on your blood glucose levels. It is important to count carbohydrates and to use alcohol carefully. This information is not intended to replace advice given to you by your health care provider. Make sure you discuss any questions you have with your health care provider. Document Released: 08/21/2005 Document Revised: 12/29/2016 Document Reviewed: 12/29/2016 Elsevier Interactive Patient Education  [employer].

## 2018-11-01 NOTE — Assessment & Plan Note (Addendum)
Chronic, ongoing. August A1C 7.8% and today A1C 6.9%.  Continue current regimen.  Recommend use of Colace and Miralax for constipation with medication.

## 2018-11-23 ENCOUNTER — Other Ambulatory Visit: Payer: Self-pay | Admitting: Physician Assistant

## 2018-11-23 DIAGNOSIS — E1165 Type 2 diabetes mellitus with hyperglycemia: Secondary | ICD-10-CM

## 2018-11-23 DIAGNOSIS — I10 Essential (primary) hypertension: Secondary | ICD-10-CM

## 2018-11-25 ENCOUNTER — Other Ambulatory Visit: Payer: Self-pay | Admitting: *Deleted

## 2018-11-25 ENCOUNTER — Other Ambulatory Visit: Payer: Self-pay | Admitting: Unknown Physician Specialty

## 2018-11-25 DIAGNOSIS — E785 Hyperlipidemia, unspecified: Secondary | ICD-10-CM

## 2018-11-25 MED ORDER — PRAVASTATIN SODIUM 40 MG PO TABS: 40.0000 mg | ORAL_TABLET | Freq: Every day | ORAL | 1 refills | Status: DC

## 2018-11-25 NOTE — Progress Notes (Signed)
Requested Prescriptions  Pending Prescriptions Disp Refills  . pravastatin (PRAVACHOL) 40 MG tablet 90 tablet 1    Sig: Take 1 tablet (40 mg total) by mouth at bedtime.     There is no refill protocol information for this order

## 2018-11-29 ENCOUNTER — Other Ambulatory Visit: Payer: Self-pay | Admitting: Unknown Physician Specialty

## 2018-12-12 ENCOUNTER — Other Ambulatory Visit: Payer: Self-pay | Admitting: Unknown Physician Specialty

## 2018-12-13 NOTE — Telephone Encounter (Signed)
Requested Prescriptions  Pending Prescriptions Disp Refills  . INVOKANA 300 MG TABS tablet [Pharmacy Med Name: INVOKANA 300 MG TABLET] 90 tablet 0    Sig: TAKE 1 TABLET BY MOUTH EVERY DAY BEFORE BREAKFAST     Endocrinology: Diabetes - SGLT2 Inhibitors - canagliflozin Passed - 12/12/2018  1:15 AM      Passed - HBA1C is between 0 and 7.9 and within 180 days    HB A1C (BAYER DCA - WAIVED)  Date Value Ref Range Status  11/01/2018 6.9 <7.0 % Final    Comment:                                          Diabetic Adult            <7.0                                       Healthy Adult        4.3 - 5.7                                                           (DCCT/NGSP) American Diabetes Association's Summary of Glycemic Recommendations for Adults with Diabetes: Hemoglobin A1c <7.0%. More stringent glycemic goals (A1c <6.0%) may further reduce complications at the cost of increased risk of hypoglycemia.          Passed - Cr in normal range and within 360 days    Creatinine, Ser  Date Value Ref Range Status  07/27/2018 0.73 0.57 - 1.00 mg/dL Final         Passed - LDL in normal range and within 360 days    LDL Calculated  Date Value Ref Range Status  01/22/2018 83 0 - 99 mg/dL Final         Passed - eGFR in normal range and within 360 days    GFR calc Af Amer  Date Value Ref Range Status  07/27/2018 101 >59 mL/min/1.73 Final   GFR calc non Af Amer  Date Value Ref Range Status  07/27/2018 88 >59 mL/min/1.73 Final         Passed - Valid encounter within last 6 months    Recent Outpatient Visits          1 month ago Uncontrolled type 2 diabetes mellitus with hyperglycemia ([place])   [place], [place] T, NP   3 months ago Uncontrolled type 2 diabetes mellitus with hyperglycemia ([place])   [place], Adriana M, PA-C   4 months ago Uncontrolled type 2 diabetes mellitus with hyperglycemia [facility])   [facility] Trinna Post, [place]    6 months ago [place], [place], [place]   7 months ago Uncontrolled type 2 diabetes mellitus with hyperglycemia [facility])   [place] Kathrine Haddock, NP      Future Appointments            In 1 month [place], Barbaraann Faster, NP [employer], [facility]

## 2019-02-03 ENCOUNTER — Ambulatory Visit (INDEPENDENT_AMBULATORY_CARE_PROVIDER_SITE_OTHER): Payer: BLUE CROSS/BLUE SHIELD | Admitting: Nurse Practitioner

## 2019-02-03 ENCOUNTER — Encounter: Payer: Self-pay | Admitting: Nurse Practitioner

## 2019-02-03 VITALS — BP 130/80 | HR 72 | Temp 98.6°F | Ht 63.0 in | Wt 174.0 lb

## 2019-02-03 DIAGNOSIS — I10 Essential (primary) hypertension: Secondary | ICD-10-CM

## 2019-02-03 DIAGNOSIS — E785 Hyperlipidemia, unspecified: Secondary | ICD-10-CM | POA: Diagnosis not present

## 2019-02-03 DIAGNOSIS — E1165 Type 2 diabetes mellitus with hyperglycemia: Secondary | ICD-10-CM | POA: Diagnosis not present

## 2019-02-03 MED ORDER — SEMAGLUTIDE (1 MG/DOSE) 2 MG/1.5ML ~~LOC~~ SOPN: 0.7500 mg | PEN_INJECTOR | SUBCUTANEOUS | 3 refills | Status: DC

## 2019-02-03 NOTE — Assessment & Plan Note (Addendum)
Chronic, ongoing.  A1C today 7.6%.  Will discuss pen issues with rep.  Have advised her to bring pens in at end of month if having issues.  Increase Ozempic to 1 MG weekly at this time.  Continue Onglyza and Invokana.  Monitor FSBS at home daily + continue focus on diet/exercise regimen.  Return in 3 months.

## 2019-02-03 NOTE — Progress Notes (Signed)
BP 130/80   Pulse 72   Temp 98.6 F (37 C) (Oral)   Ht 5\' 3"  (1.6 m)   Wt 174 lb (78.9 kg)   LMP  (LMP Unknown)   SpO2 99%   BMI 30.82 kg/m    Subjective:    Patient ID: Christine Booth, female    DOB: 01/23/1956, 63 y.o.   MRN: [id number]  HPI: Christine Booth is a 63 y.o. female  Chief Complaint  Patient presents with  . Follow-up  . Diabetes  . Hypertension   DIABETES Previous A1C in November 6.9%.  Reports he Ozempic pen has not been working.  States at end of month she is missing doses.  Endorses decrease time at gym over past months due to back pain, which has now improved.  She is now back in gym.  Continues on Ozempic, Invokana, and Onglyza. Hypoglycemic episodes:no Polydipsia/polyuria: no Visual disturbance: no Chest pain: no Paresthesias: no Glucose Monitoring: yes  Accucheck frequency: twice a month  Fasting glucose: 160-170  Post prandial:  Evening:  Before meals: Taking Insulin?: no  Long acting insulin:  Short acting insulin: Blood Pressure Monitoring: rarely Retinal Examination: Up to Date Foot Exam: Up to Date Pneumovax: Up to Date Influenza: Up to Date Aspirin: yes   HYPERTENSION / HYPERLIPIDEMIA Continues on Pravastatin for HLD and Losartan and HCTZ for HTN. Satisfied with current treatment? yes Duration of hypertension: chronic BP monitoring frequency: rarely BP range:  120-130/70-80 BP medication side effects: no Duration of hyperlipidemia: chronic Cholesterol medication side effects: no Cholesterol supplements: none Medication compliance: good compliance Aspirin: yes Recent stressors: no Recurrent headaches: no Visual changes: no Palpitations: no Dyspnea: no Chest pain: no Lower extremity edema: no Dizzy/lightheaded: no  Relevant past medical, surgical, family and social history reviewed and updated as indicated. Interim medical history since our last visit reviewed. Allergies and medications reviewed and  updated.  Review of Systems  Constitutional: Negative for activity change, appetite change, diaphoresis, fatigue and fever.  Respiratory: Negative for cough, chest tightness and shortness of breath.   Cardiovascular: Negative for chest pain, palpitations and leg swelling.  Gastrointestinal: Negative for abdominal distention, abdominal pain, constipation, diarrhea, nausea and vomiting.  Endocrine: Negative for cold intolerance, heat intolerance, polydipsia, polyphagia and polyuria.  Neurological: Negative for dizziness, syncope, weakness, light-headedness, numbness and headaches.  Psychiatric/Behavioral: Negative.     Per HPI unless specifically indicated above     Objective:    BP 130/80   Pulse 72   Temp 98.6 F (37 C) (Oral)   Ht 5\' 3"  (1.6 m)   Wt 174 lb (78.9 kg)   LMP  (LMP Unknown)   SpO2 99%   BMI 30.82 kg/m   Wt Readings from Last 3 Encounters:  02/03/19 174 lb (78.9 kg)  11/01/18 173 lb 6.4 oz (78.7 kg)  08/31/18 178 lb 6.4 oz (80.9 kg)    Physical Exam Vitals signs and nursing note reviewed.  Constitutional:      Appearance: She is well-developed.  HENT:     Head: Normocephalic.  Eyes:     General:        Right eye: No discharge.        Left eye: No discharge.     Conjunctiva/sclera: Conjunctivae normal.     Pupils: Pupils are equal, round, and reactive to light.  Neck:     Musculoskeletal: Normal range of motion and neck supple.     Thyroid: No thyromegaly.     Vascular:  No carotid bruit or JVD.  Cardiovascular:     Rate and Rhythm: Normal rate and regular rhythm.     Heart sounds: Normal heart sounds. No murmur. No gallop.   Pulmonary:     Effort: Pulmonary effort is normal.     Breath sounds: Normal breath sounds.  Abdominal:     General: Bowel sounds are normal.     Palpations: Abdomen is soft.  Musculoskeletal:     Right lower leg: No edema.     Left lower leg: No edema.  Lymphadenopathy:     Cervical: No cervical adenopathy.  Skin:     General: Skin is warm and dry.  Neurological:     Mental Status: She is alert and oriented to person, place, and time.  Psychiatric:        Mood and Affect: Mood normal.        Behavior: Behavior normal.        Thought Content: Thought content normal.        Judgment: Judgment normal.     Results for orders placed or performed in visit on 11/01/18  Bayer DCA Hb A1c Waived (STAT)  Result Value Ref Range   HB A1C (BAYER DCA - WAIVED) 6.9 <7.0 %      Assessment & Plan:   Problem List Items Addressed This Visit      Cardiovascular and Mediastinum   Essential hypertension, benign    Chronic, ongoing.  BP at goal.  Continue current medication regimen.  CMP next visit.      Relevant Orders   Bayer DCA Hb A1c Waived     Endocrine   Hyperlipidemia associated with type 2 diabetes mellitus (HCC)    Chronic, ongoing.  Continue current medication regimen.  Lipid panel next visit.      Relevant Medications   Semaglutide, 1 MG/DOSE, (OZEMPIC, 1 MG/DOSE,) 2 MG/1.5ML SOPN   Uncontrolled type 2 diabetes mellitus ([place]) - Primary    Chronic, ongoing.  A1C today 7.6%.  Will discuss pen issues with rep.  Have advised her to bring pens in at end of month if having issues.  Increase Ozempic to 1 MG weekly at this time.  Continue Onglyza and Invokana.  Monitor FSBS at home daily + continue focus on diet/exercise regimen.  Return in 3 months.      Relevant Medications   Semaglutide, 1 MG/DOSE, (OZEMPIC, 1 MG/DOSE,) 2 MG/1.5ML SOPN   Other Relevant Orders   Bayer DCA Hb A1c Waived       Follow up plan: Return in about 3 months (around 05/04/2019) for Annual Physical with Diabetes check.

## 2019-02-03 NOTE — Assessment & Plan Note (Signed)
Chronic, ongoing.  BP at goal.  Continue current medication regimen.  CMP next visit.

## 2019-02-03 NOTE — Patient Instructions (Signed)
Carbohydrate Counting for Diabetes Mellitus, Adult  Carbohydrate counting is a method of keeping track of how many carbohydrates you eat. Eating carbohydrates naturally increases the amount of sugar (glucose) in the blood. Counting how many carbohydrates you eat helps keep your blood glucose within normal limits, which helps you manage your diabetes (diabetes mellitus). It is important to know how many carbohydrates you can safely have in each meal. This is different for every person. A diet and nutrition specialist (registered dietitian) can help you make a meal plan and calculate how many carbohydrates you should have at each meal and snack. Carbohydrates are found in the following foods:  Grains, such as breads and cereals.  Dried beans and soy products.  Starchy vegetables, such as potatoes, peas, and corn.  Fruit and fruit juices.  Milk and yogurt.  Sweets and snack foods, such as cake, cookies, candy, chips, and soft drinks. How do I count carbohydrates? There are two ways to count carbohydrates in food. You can use either of the methods or a combination of both. Reading "Nutrition Facts" on packaged food The "Nutrition Facts" list is included on the labels of almost all packaged foods and beverages in the [place] It includes:  The serving size.  Information about nutrients in each serving, including the grams (g) of carbohydrate per serving. To use the "Nutrition Facts":  Decide how many servings you will have.  Multiply the number of servings by the number of carbohydrates per serving.  The resulting number is the total amount of carbohydrates that you will be having. Learning standard serving sizes of other foods When you eat carbohydrate foods that are not packaged or do not include "Nutrition Facts" on the label, you need to measure the servings in order to count the amount of carbohydrates:  Measure the foods that you will eat with a food scale or measuring cup, if needed.   Decide how many standard-size servings you will eat.  Multiply the number of servings by 15. Most carbohydrate-rich foods have about 15 g of carbohydrates per serving. ? For example, if you eat 8 oz (170 g) of strawberries, you will have eaten 2 servings and 30 g of carbohydrates (2 servings x 15 g = 30 g).  For foods that have more than one food mixed, such as soups and casseroles, you must count the carbohydrates in each food that is included. The following list contains standard serving sizes of common carbohydrate-rich foods. Each of these servings has about 15 g of carbohydrates:   hamburger bun or  English muffin.   oz (15 mL) syrup.   oz (14 g) jelly.  1 slice of bread.  1 six-inch tortilla.  3 oz (85 g) cooked rice or pasta.  4 oz (113 g) cooked dried beans.  4 oz (113 g) starchy vegetable, such as peas, corn, or potatoes.  4 oz (113 g) hot cereal.  4 oz (113 g) mashed potatoes or  of a large baked potato.  4 oz (113 g) canned or frozen fruit.  4 oz (120 mL) fruit juice.  4-6 crackers.  6 chicken nuggets.  6 oz (170 g) unsweetened dry cereal.  6 oz (170 g) plain fat-free yogurt or yogurt sweetened with artificial sweeteners.  8 oz (240 mL) milk.  8 oz (170 g) fresh fruit or one small piece of fruit.  24 oz (680 g) popped popcorn. Example of carbohydrate counting Sample meal  3 oz (85 g) chicken breast.  6 oz (170 g)   brown rice.  4 oz (113 g) corn.  8 oz (240 mL) milk.  8 oz (170 g) strawberries with sugar-free whipped topping. Carbohydrate calculation 1. Identify the foods that contain carbohydrates: ? Rice. ? Corn. ? Milk. ? Strawberries. 2. Calculate how many servings you have of each food: ? 2 servings rice. ? 1 serving corn. ? 1 serving milk. ? 1 serving strawberries. 3. Multiply each number of servings by 15 g: ? 2 servings rice x 15 g = 30 g. ? 1 serving corn x 15 g = 15 g. ? 1 serving milk x 15 g = 15 g. ? 1 serving  strawberries x 15 g = 15 g. 4. Add together all of the amounts to find the total grams of carbohydrates eaten: ? 30 g + 15 g + 15 g + 15 g = 75 g of carbohydrates total. Summary  Carbohydrate counting is a method of keeping track of how many carbohydrates you eat.  Eating carbohydrates naturally increases the amount of sugar (glucose) in the blood.  Counting how many carbohydrates you eat helps keep your blood glucose within normal limits, which helps you manage your diabetes.  A diet and nutrition specialist (registered dietitian) can help you make a meal plan and calculate how many carbohydrates you should have at each meal and snack. This information is not intended to replace advice given to you by your health care provider. Make sure you discuss any questions you have with your health care provider. Document Released: 11/24/2005 Document Revised: 06/03/2017 Document Reviewed: 05/07/2016 Elsevier Interactive Patient Education  2019 Elsevier Inc.  

## 2019-02-03 NOTE — Assessment & Plan Note (Signed)
Chronic, ongoing.  Continue current medication regimen.  Lipid panel next visit. 

## 2019-02-04 LAB — BAYER DCA HB A1C WAIVED: HB A1C (BAYER DCA - WAIVED): 7.6 % — ABNORMAL HIGH (ref <7.0)

## 2019-02-23 ENCOUNTER — Other Ambulatory Visit: Payer: Self-pay | Admitting: Nurse Practitioner

## 2019-02-23 DIAGNOSIS — E1165 Type 2 diabetes mellitus with hyperglycemia: Secondary | ICD-10-CM

## 2019-02-23 DIAGNOSIS — I10 Essential (primary) hypertension: Secondary | ICD-10-CM

## 2019-02-23 NOTE — Telephone Encounter (Signed)
Requested Prescriptions  Pending Prescriptions Disp Refills  . hydrochlorothiazide (HYDRODIURIL) 12.5 MG tablet [Pharmacy Med Name: HYDROCHLOROTHIAZIDE 12.5 MG TB] 90 tablet 0    Sig: TAKE 1 TABLET BY MOUTH EVERY DAY     Cardiovascular: Diuretics - Thiazide Passed - 02/23/2019  6:29 AM      Passed - Ca in normal range and within 360 days    Calcium  Date Value Ref Range Status  07/27/2018 9.9 8.7 - 10.3 mg/dL Final         Passed - Cr in normal range and within 360 days    Creatinine, Ser  Date Value Ref Range Status  07/27/2018 0.73 0.57 - 1.00 mg/dL Final         Passed - K in normal range and within 360 days    Potassium  Date Value Ref Range Status  07/27/2018 3.9 3.5 - 5.2 mmol/L Final         Passed - Na in normal range and within 360 days    Sodium  Date Value Ref Range Status  07/27/2018 139 134 - 144 mmol/L Final         Passed - Last BP in normal range    BP Readings from Last 1 Encounters:  02/03/19 130/80         Passed - Valid encounter within last 6 months    Recent Outpatient Visits          2 weeks ago Uncontrolled type 2 diabetes mellitus with hyperglycemia ([place])   [place], Jolene T, NP   3 months ago Uncontrolled type 2 diabetes mellitus with hyperglycemia ([place])   [place], Jolene T, NP   5 months ago Uncontrolled type 2 diabetes mellitus with hyperglycemia ([place])   [place], [place], PA-C   7 months ago Uncontrolled type 2 diabetes mellitus with hyperglycemia ([place])   [place], [place], PA-C   8 months ago [place], Lilia Argue, [place]      Future Appointments            In 2 months Cannady, Barbaraann Faster, NP [facility]         . losartan (COZAAR) 100 MG tablet [employer] Med Name: LOSARTAN POTASSIUM 100 MG TAB] 90 tablet 0    Sig: TAKE 1 TABLET BY MOUTH EVERY DAY     Cardiovascular:  Angiotensin  Receptor Blockers Failed - 02/23/2019  6:29 AM      Failed - Cr in normal range and within 180 days    Creatinine, Ser  Date Value Ref Range Status  07/27/2018 0.73 0.57 - 1.00 mg/dL Final         Failed - K in normal range and within 180 days    Potassium  Date Value Ref Range Status  07/27/2018 3.9 3.5 - 5.2 mmol/L Final         Passed - Patient is not pregnant      Passed - Last BP in normal range    BP Readings from Last 1 Encounters:  02/03/19 130/80         Passed - Valid encounter within last 6 months    Recent Outpatient Visits          2 weeks ago Uncontrolled type 2 diabetes mellitus with hyperglycemia ([place])   [place], Jolene T, NP   3 months ago Uncontrolled type 2 diabetes mellitus  with hyperglycemia ([place])   [facility] [place], Vaughn T, NP   5 months ago Uncontrolled type 2 diabetes mellitus with hyperglycemia [facility]   [place], [place], PA-C   7 months ago Uncontrolled type 2 diabetes mellitus with hyperglycemia [facility])   [facility] Trin[place], PA-C   8 months ago [place], Lilia Argue, [place]      Future Appointments            In 2 months Cannady, Barbaraann Faster, NP [facility]

## 2019-03-08 ENCOUNTER — Other Ambulatory Visit: Payer: Self-pay | Admitting: Nurse Practitioner

## 2019-03-08 MED ORDER — FLUTICASONE PROPIONATE 50 MCG/ACT NA SUSP: 2.0000 | Freq: Every day | NASAL | 2 refills | Status: DC

## 2019-03-13 IMAGING — DX DG THORACIC SPINE 3V
3 series · 3 of 3 positions shown · non-contrast
Comparison: None in PACs

CLINICAL DATA: Mid back pain at the level of the bra line for the
past 2 weeks made worse with lifting heavy items or reaching patient
had a fall 1 month ago without definite relationship to the back
symptoms.

EXAM:
THORACIC SPINE - 3 VIEWS

[t-spine ap]
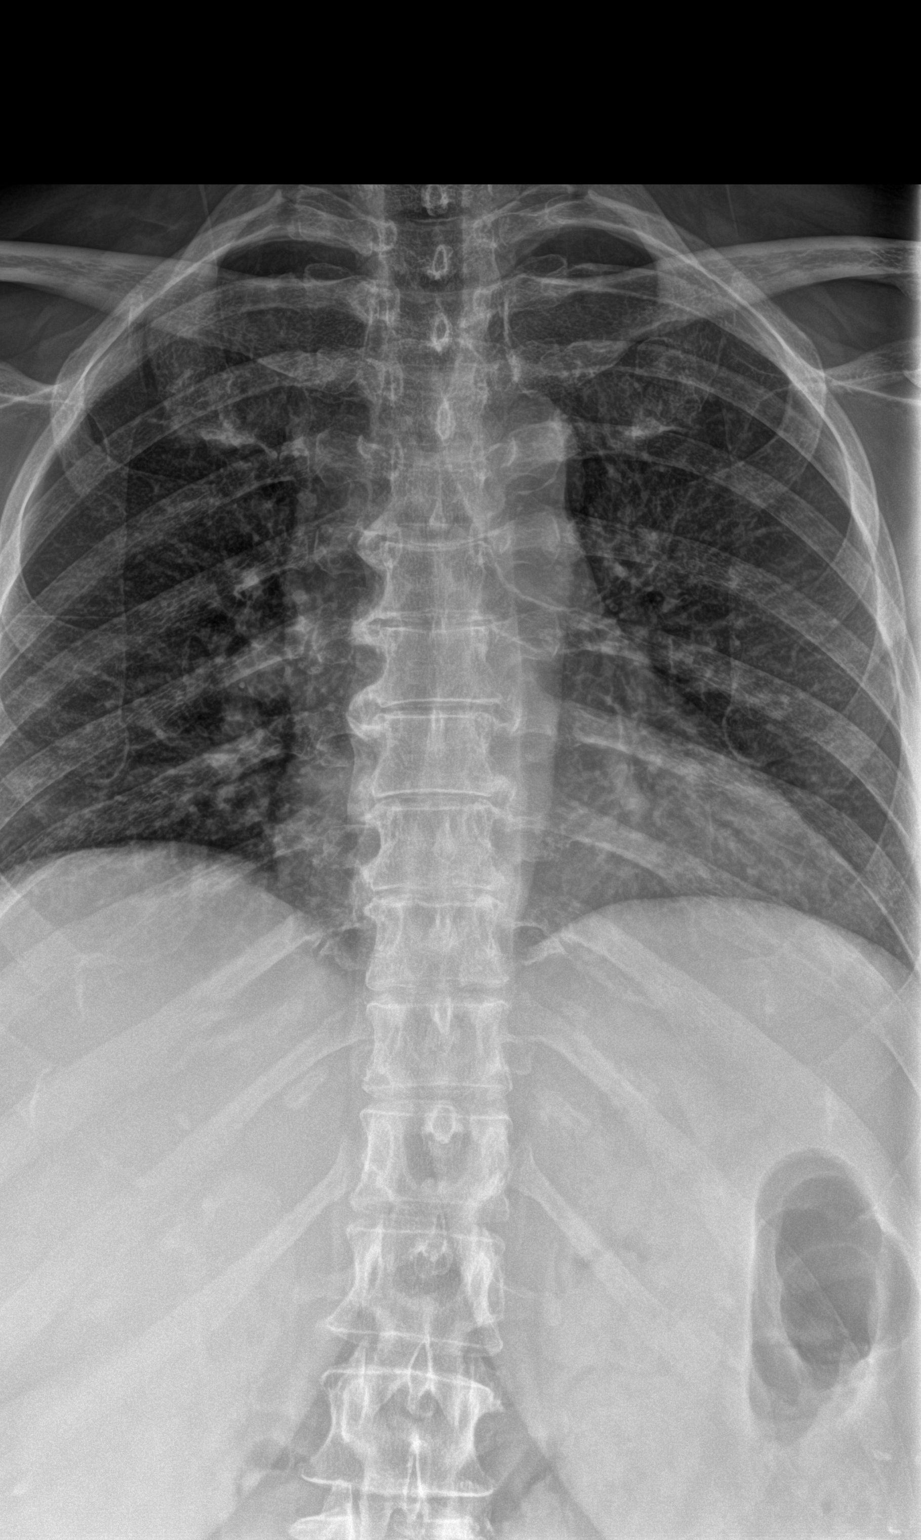

[t-spine lat]
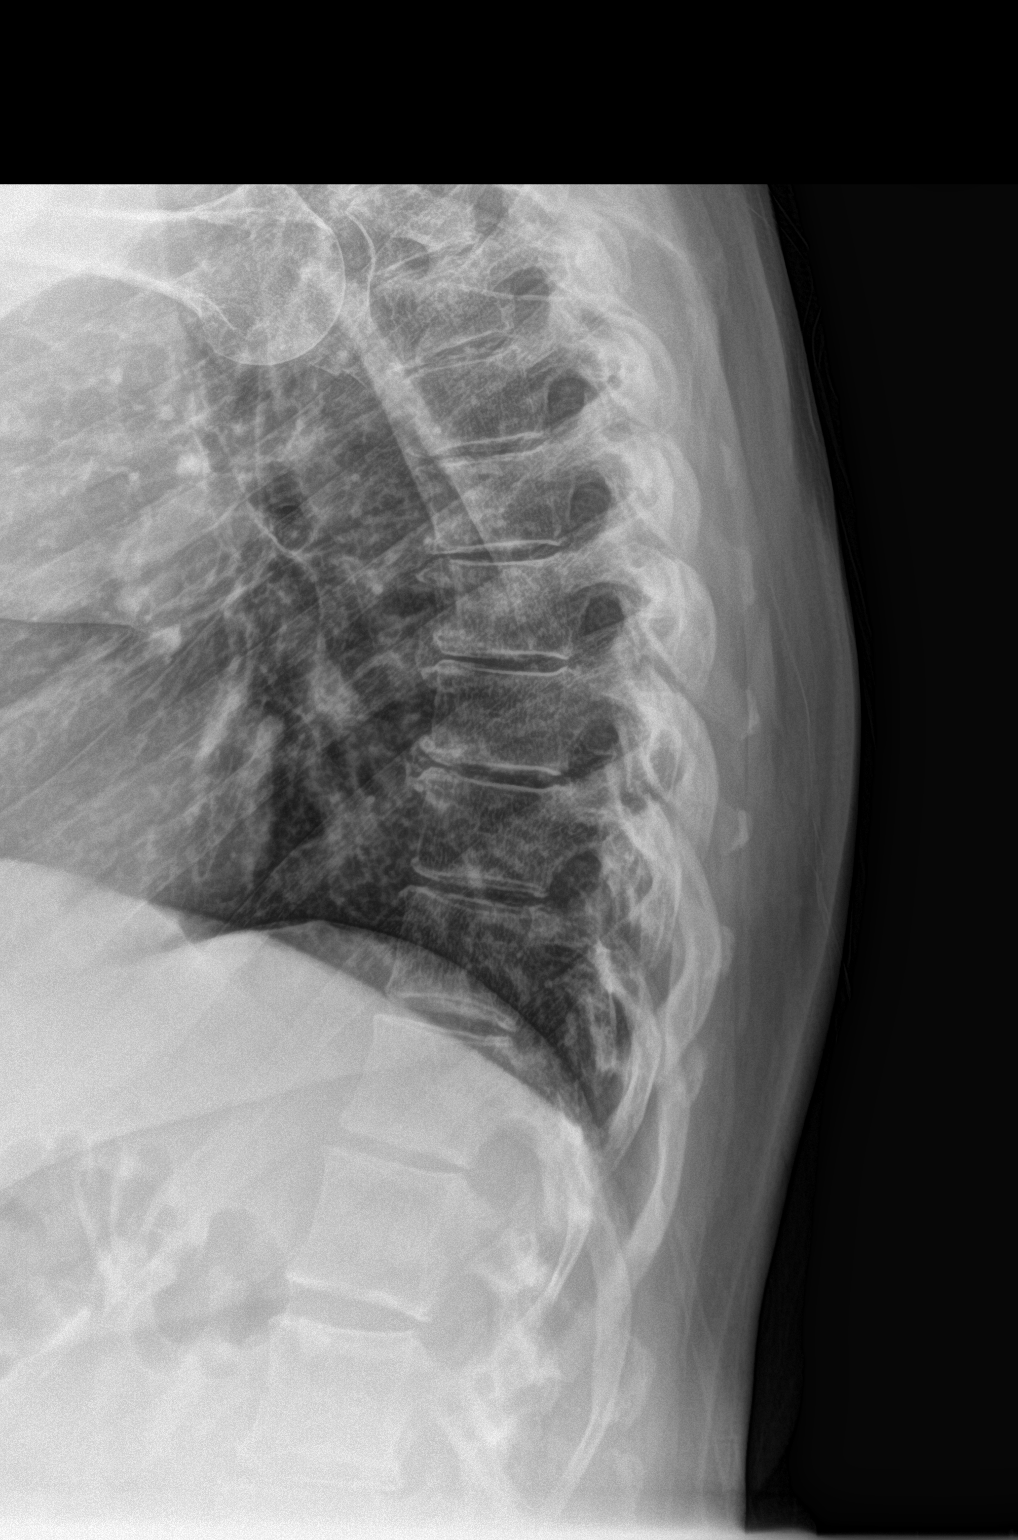

[t-spine swimmers]
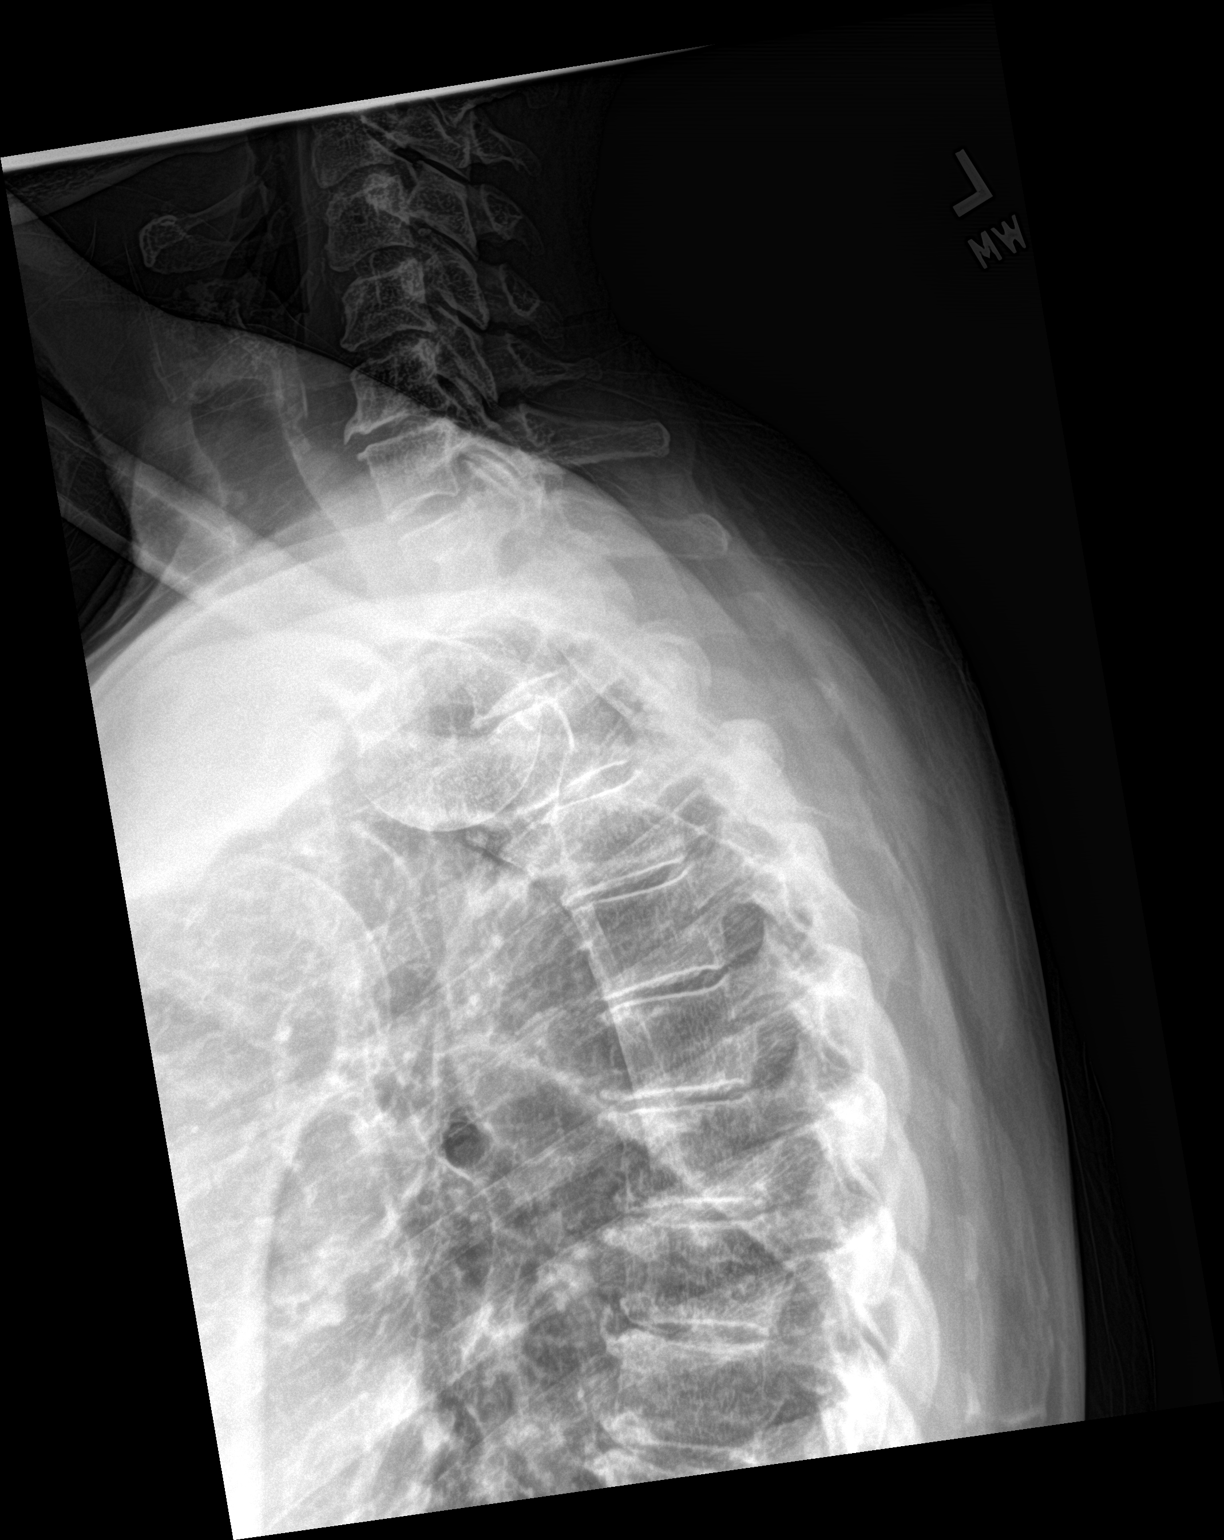

[3 of 3 positions shown; findings below may reference images not displayed]

FINDINGS: The thoracic vertebral bodies are preserved in height. The pedicles
are intact. There are small anterior and especially right lateral
endplate osteophytes in the mid and lower thoracic spine. There are
no abnormal paravertebral soft tissue densities. There is no
high-grade disc space narrowing.
IMPRESSION: Mild multilevel degenerative disc disease in the mid and lower
thoracic spine. No acute compression fracture.

## 2019-04-01 ENCOUNTER — Other Ambulatory Visit: Payer: Self-pay | Admitting: Unknown Physician Specialty

## 2019-04-01 NOTE — Telephone Encounter (Signed)
Requested Prescriptions  Pending Prescriptions Disp Refills  . fluticasone (FLONASE) 50 MCG/ACT nasal spray [Pharmacy Med Name: FLUTICASONE PROP 50 MCG SPRAY] 16 g 2    Sig: SPRAY 2 SPRAYS INTO EACH NOSTRIL EVERY DAY     Ear, Nose, and Throat: Nasal Preparations - Corticosteroids Passed - 04/01/2019  2:31 PM      Passed - Valid encounter within last 12 months    Recent Outpatient Visits          1 month ago Uncontrolled type 2 diabetes mellitus with hyperglycemia ([place])   [place], Jolene T, NP   5 months ago Uncontrolled type 2 diabetes mellitus with hyperglycemia ([place])   [place], [place] T, NP   7 months ago Uncontrolled type 2 diabetes mellitus with hyperglycemia [facility])   [place], Adriana M, PA-C   8 months ago Uncontrolled type 2 diabetes mellitus with hyperglycemia [facility])   [facility] Trinna Post, [place]   9 months ago [place], Lilia Argue, [place]      Future Appointments            In 1 month Cannady, Barbaraann Faster, NP [facility]

## 2019-04-07 ENCOUNTER — Other Ambulatory Visit: Payer: Self-pay | Admitting: Unknown Physician Specialty

## 2019-04-30 ENCOUNTER — Other Ambulatory Visit: Payer: Self-pay | Admitting: Unknown Physician Specialty

## 2019-05-04 ENCOUNTER — Ambulatory Visit (INDEPENDENT_AMBULATORY_CARE_PROVIDER_SITE_OTHER): Payer: BLUE CROSS/BLUE SHIELD | Admitting: Nurse Practitioner

## 2019-05-04 ENCOUNTER — Encounter: Payer: Self-pay | Admitting: Nurse Practitioner

## 2019-05-04 ENCOUNTER — Other Ambulatory Visit: Payer: Self-pay

## 2019-05-04 VITALS — BP 108/68 | HR 85 | Temp 98.3°F | Ht 60.43 in | Wt 171.0 lb

## 2019-05-04 DIAGNOSIS — Z6828 Body mass index (BMI) 28.0-28.9, adult: Secondary | ICD-10-CM | POA: Insufficient documentation

## 2019-05-04 DIAGNOSIS — E669 Obesity, unspecified: Secondary | ICD-10-CM | POA: Insufficient documentation

## 2019-05-04 DIAGNOSIS — Z0000 Encounter for general adult medical examination without abnormal findings: Secondary | ICD-10-CM | POA: Diagnosis not present

## 2019-05-04 DIAGNOSIS — E1169 Type 2 diabetes mellitus with other specified complication: Secondary | ICD-10-CM | POA: Diagnosis not present

## 2019-05-04 DIAGNOSIS — I10 Essential (primary) hypertension: Secondary | ICD-10-CM

## 2019-05-04 DIAGNOSIS — Z1239 Encounter for other screening for malignant neoplasm of breast: Secondary | ICD-10-CM

## 2019-05-04 DIAGNOSIS — Z6832 Body mass index (BMI) 32.0-32.9, adult: Secondary | ICD-10-CM

## 2019-05-04 DIAGNOSIS — E6609 Other obesity due to excess calories: Secondary | ICD-10-CM

## 2019-05-04 DIAGNOSIS — Z1211 Encounter for screening for malignant neoplasm of colon: Secondary | ICD-10-CM

## 2019-05-04 DIAGNOSIS — Z1329 Encounter for screening for other suspected endocrine disorder: Secondary | ICD-10-CM

## 2019-05-04 DIAGNOSIS — K219 Gastro-esophageal reflux disease without esophagitis: Secondary | ICD-10-CM

## 2019-05-04 DIAGNOSIS — E1165 Type 2 diabetes mellitus with hyperglycemia: Secondary | ICD-10-CM | POA: Diagnosis not present

## 2019-05-04 DIAGNOSIS — E785 Hyperlipidemia, unspecified: Secondary | ICD-10-CM

## 2019-05-04 DIAGNOSIS — E66811 Obesity, class 1: Secondary | ICD-10-CM

## 2019-05-04 LAB — LIPID PANEL PICCOLO, WAIVED
Chol/HDL Ratio Piccolo,Waive: 3.2 mg/dL
Cholesterol Piccolo, Waived: 160 mg/dL (ref <200)
HDL Chol Piccolo, Waived: 50 mg/dL — ABNORMAL LOW (ref >59)
LDL Chol Calc Piccolo Waived: 88 mg/dL (ref <100)
Triglycerides Piccolo,Waived: 108 mg/dL (ref <150)
VLDL Chol Calc Piccolo,Waive: 22 mg/dL (ref <30)

## 2019-05-04 LAB — MICROALBUMIN, URINE WAIVED
Creatinine, Urine Waived: 50 mg/dL (ref 10–300)
Microalb, Ur Waived: 10 mg/L (ref 0–19)

## 2019-05-04 LAB — BAYER DCA HB A1C WAIVED: HB A1C (BAYER DCA - WAIVED): 6.8 % (ref <7.0)

## 2019-05-04 NOTE — Patient Instructions (Signed)
Carbohydrate Counting for Diabetes Mellitus, Adult  Carbohydrate counting is a method of keeping track of how many carbohydrates you eat. Eating carbohydrates naturally increases the amount of sugar (glucose) in the blood. Counting how many carbohydrates you eat helps keep your blood glucose within normal limits, which helps you manage your diabetes (diabetes mellitus). It is important to know how many carbohydrates you can safely have in each meal. This is different for every person. A diet and nutrition specialist (registered dietitian) can help you make a meal plan and calculate how many carbohydrates you should have at each meal and snack. Carbohydrates are found in the following foods:  Grains, such as breads and cereals.  Dried beans and soy products.  Starchy vegetables, such as potatoes, peas, and corn.  Fruit and fruit juices.  Milk and yogurt.  Sweets and snack foods, such as cake, cookies, candy, chips, and soft drinks. How do I count carbohydrates? There are two ways to count carbohydrates in food. You can use either of the methods or a combination of both. Reading "Nutrition Facts" on packaged food The "Nutrition Facts" list is included on the labels of almost all packaged foods and beverages in the [place] It includes:  The serving size.  Information about nutrients in each serving, including the grams (g) of carbohydrate per serving. To use the "Nutrition Facts":  Decide how many servings you will have.  Multiply the number of servings by the number of carbohydrates per serving.  The resulting number is the total amount of carbohydrates that you will be having. Learning standard serving sizes of other foods When you eat carbohydrate foods that are not packaged or do not include "Nutrition Facts" on the label, you need to measure the servings in order to count the amount of carbohydrates:  Measure the foods that you will eat with a food scale or measuring cup, if needed.   Decide how many standard-size servings you will eat.  Multiply the number of servings by 15. Most carbohydrate-rich foods have about 15 g of carbohydrates per serving. ? For example, if you eat 8 oz (170 g) of strawberries, you will have eaten 2 servings and 30 g of carbohydrates (2 servings x 15 g = 30 g).  For foods that have more than one food mixed, such as soups and casseroles, you must count the carbohydrates in each food that is included. The following list contains standard serving sizes of common carbohydrate-rich foods. Each of these servings has about 15 g of carbohydrates:   hamburger bun or  English muffin.   oz (15 mL) syrup.   oz (14 g) jelly.  1 slice of bread.  1 six-inch tortilla.  3 oz (85 g) cooked rice or pasta.  4 oz (113 g) cooked dried beans.  4 oz (113 g) starchy vegetable, such as peas, corn, or potatoes.  4 oz (113 g) hot cereal.  4 oz (113 g) mashed potatoes or  of a large baked potato.  4 oz (113 g) canned or frozen fruit.  4 oz (120 mL) fruit juice.  4-6 crackers.  6 chicken nuggets.  6 oz (170 g) unsweetened dry cereal.  6 oz (170 g) plain fat-free yogurt or yogurt sweetened with artificial sweeteners.  8 oz (240 mL) milk.  8 oz (170 g) fresh fruit or one small piece of fruit.  24 oz (680 g) popped popcorn. Example of carbohydrate counting Sample meal  3 oz (85 g) chicken breast.  6 oz (170 g)   brown rice.  4 oz (113 g) corn.  8 oz (240 mL) milk.  8 oz (170 g) strawberries with sugar-free whipped topping. Carbohydrate calculation 1. Identify the foods that contain carbohydrates: ? Rice. ? Corn. ? Milk. ? Strawberries. 2. Calculate how many servings you have of each food: ? 2 servings rice. ? 1 serving corn. ? 1 serving milk. ? 1 serving strawberries. 3. Multiply each number of servings by 15 g: ? 2 servings rice x 15 g = 30 g. ? 1 serving corn x 15 g = 15 g. ? 1 serving milk x 15 g = 15 g. ? 1 serving  strawberries x 15 g = 15 g. 4. Add together all of the amounts to find the total grams of carbohydrates eaten: ? 30 g + 15 g + 15 g + 15 g = 75 g of carbohydrates total. Summary  Carbohydrate counting is a method of keeping track of how many carbohydrates you eat.  Eating carbohydrates naturally increases the amount of sugar (glucose) in the blood.  Counting how many carbohydrates you eat helps keep your blood glucose within normal limits, which helps you manage your diabetes.  A diet and nutrition specialist (registered dietitian) can help you make a meal plan and calculate how many carbohydrates you should have at each meal and snack. This information is not intended to replace advice given to you by your health care provider. Make sure you discuss any questions you have with your health care provider. Document Released: 11/24/2005 Document Revised: 06/03/2017 Document Reviewed: 05/07/2016 Elsevier Interactive Patient Education  2019 Elsevier Inc.  

## 2019-05-04 NOTE — Assessment & Plan Note (Signed)
Recommend continued focus on health diet choices and regular physical activity (30 minutes 5 days a week).  Focus on small weight loss goals vs large ones.

## 2019-05-04 NOTE — Progress Notes (Signed)
BP 108/68   Pulse 85   Temp 98.3 F (36.8 C) (Oral)   Ht 5' 0.43" (1.535 m)   Wt 171 lb (77.6 kg)   LMP  (LMP Unknown)   SpO2 98%   BMI 32.92 kg/m    Subjective:    Patient ID: Christine Booth, female    DOB: [birth year], 63 y.o.   MRN: [id number]  HPI: Christine Booth is a 63 y.o. female presenting on 05/04/2019 for comprehensive medical examination. Current medical complaints include: none  She currently lives with: on her own Menopausal Symptoms: no   DIABETES Continues on Onglyza. Invokana, and Ozempic.  Last A1C 7.6.  Ozempic increased to 1 MG at last visit. Hypoglycemic episodes:no Polydipsia/polyuria: no Visual disturbance: no Chest pain: no Paresthesias: no Glucose Monitoring: yes  Accucheck frequency: a few times a week  Fasting glucose: 150-160's  Post prandial:  Evening:  Before meals: Taking Insulin?: no  Long acting insulin:  Short acting insulin: Blood Pressure Monitoring: not checking Retinal Examination: Up to Date Foot Exam: Up to Date Pneumovax: Up to Date Influenza: Up to Date Aspirin: yes   HYPERTENSION / HYPERLIPIDEMIA Continues on HCTZ, Losartan, and Pravastatin. Satisfied with current treatment? yes Duration of hypertension: chronic BP monitoring frequency: not checking BP range:  BP medication side effects: no Duration of hyperlipidemia: chronic Cholesterol medication side effects: no Cholesterol supplements: none Medication compliance: good compliance Aspirin: yes Recent stressors: no Recurrent headaches: no Visual changes: no Palpitations: no Dyspnea: no Chest pain: no Lower extremity edema: no Dizzy/lightheaded: no   GERD Has history of endoscopy "15 years ago" during which they stretched her esophagus.  Protonix 40 MG daily.  For two weeks has had more heart burn symptoms, currently once a week episodes.  Reports "I wonder if it is food induced".   GERD control status: exacerbated  Satisfied with current  treatment? yes Heartburn frequency: once or twice a week Medication side effects: no  Medication compliance: good Previous GERD medications:unknown Antacid use frequency:  none Nature: burning epigastric Location: epigastric Heartburn duration: 10 minutes Alleviatiating factors:  Protonix Aggravating factors: unknown Dysphagia: no Odynophagia:  no Hematemesis: no Blood in stool: no   Depression Screen done today and results listed below:  Depression screen [facility] 2/9 05/04/2019 01/22/2018 01/12/2017 12/11/2015  Decreased Interest 0 0 0 0  Down, Depressed, Hopeless 0 0 0 0  PHQ - 2 Score 0 0 0 0  Altered sleeping 0 0 0 -  Tired, decreased energy 0 0 0 -  Change in appetite 0 0 0 -  Feeling bad or failure about yourself  0 0 0 -  Trouble concentrating 0 0 0 -  Moving slowly or fidgety/restless 0 0 0 -  Suicidal thoughts 0 0 0 -  PHQ-9 Score 0 0 0 -    The patient does not have a history of falls. I did not complete a risk assessment for falls. A plan of care for falls was not documented.   Past Medical History:  Past Medical History:  Diagnosis Date  . Allergy   . 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  . Chronic kidney disease stage 1  . Diabetes mellitus without complication ([place])   . Gastrointestinal disorder   . GERD (gastroesophageal reflux disease)   . Heart murmur   . Hyperlipidemia   . Hypertension   . Rosacea     Surgical History:  Past Surgical History:  Procedure Laterality Date  . COLONOSCOPY WITH PROPOFOL  N/A 02/06/2017   Procedure: COLONOSCOPY WITH PROPOFOL;  Surgeon: Jonathon Bellows, MD;  Location: [facility]COPY;  Service: Endoscopy;  Laterality: N/A;  . TUBAL LIGATION  1985    Medications:  Current Outpatient Medications on File Prior to Visit  Medication Sig  . aspirin EC 81 MG tablet Take 81 mg by mouth daily.  . Biotin 300 MCG TABS Take 1 tablet by mouth daily.  . cetirizine (ZYRTEC) 10 MG tablet Take 10 mg by mouth daily.  .  fluticasone (FLONASE) 50 MCG/ACT nasal spray SPRAY 2 SPRAYS INTO EACH NOSTRIL EVERY DAY  . glucose blood test strip Use as instructed  . hydrochlorothiazide (HYDRODIURIL) 12.5 MG tablet TAKE 1 TABLET BY MOUTH EVERY DAY  . INVOKANA 300 MG TABS tablet TAKE 1 TABLET BY MOUTH EVERY DAY BEFORE BREAKFAST  . losartan (COZAAR) 100 MG tablet TAKE 1 TABLET BY MOUTH EVERY DAY  . mupirocin ointment (BACTROBAN) 2 % Place 1 application into the nose 2 (two) times daily.  . pantoprazole (PROTONIX) 40 MG tablet TAKE 1 TABLET (40 MG TOTAL) BY MOUTH DAILY.  . pravastatin (PRAVACHOL) 40 MG tablet Take 1 tablet (40 mg total) by mouth at bedtime.  . saxagliptin HCl (ONGLYZA) 5 MG TABS tablet TAKE 1 TABLET BY MOUTH EVERY DAY  . Semaglutide, 1 MG/DOSE, (OZEMPIC, 1 MG/DOSE,) 2 MG/1.5ML SOPN Inject 0.75 mg into the skin once a week.  . triamcinolone (KENALOG) 0.025 % cream Apply 1 application topically 2 (two) times daily.   No current facility-administered medications on file prior to visit.     Allergies:  No Known Allergies  Social History:  Social History   Socioeconomic History  . Marital status: Divorced    Spouse name: Not on file  . Number of children: Not on file  . Years of education: Not on file  . Highest education level: Not on file  Occupational History  . Not on file  Social Needs  . Financial resource strain: Not on file  . Food insecurity:    Worry: Not on file    Inability: Not on file  . Transportation needs:    Medical: Not on file    Non-medical: Not on file  Tobacco Use  . Smoking status: Never Smoker  . Smokeless tobacco: Never Used  Substance and Sexual Activity  . Alcohol use: No    Alcohol/week: 0.0 standard drinks  . Drug use: No  . Sexual activity: Never  Lifestyle  . Physical activity:    Days per week: Not on file    Minutes per session: Not on file  . Stress: Not on file  Relationships  . Social connections:    Talks on phone: Not on file    Gets together:  Not on file    Attends religious service: Not on file    Active member of club or organization: Not on file    Attends meetings of clubs or organizations: Not on file    Relationship status: Not on file  . Intimate partner violence:    Fear of current or ex partner: Not on file    Emotionally abused: Not on file    Physically abused: Not on file    Forced sexual activity: Not on file  Other Topics Concern  . Not on file  Social History Narrative  . Not on file   Social History   Tobacco Use  Smoking Status Never Smoker  Smokeless Tobacco Never Used   Social History   Substance and Sexual  Activity  Alcohol Use No  . Alcohol/week: 0.0 standard drinks    Family History:  Family History  Problem Relation Age of Onset  . Arthritis Mother   . Cancer Mother        breast  . Heart disease Mother   . Cancer Father        bladder  . Heart disease Father   . Hypertension Father     Past medical history, surgical history, medications, allergies, family history and social history reviewed with patient today and changes made to appropriate areas of the chart.   Review of Systems - negative all All other ROS negative except what is listed above and in the HPI.      Objective:    BP 108/68   Pulse 85   Temp 98.3 F (36.8 C) (Oral)   Ht 5' 0.43" (1.535 m)   Wt 171 lb (77.6 kg)   LMP  (LMP Unknown)   SpO2 98%   BMI 32.92 kg/m   Wt Readings from Last 3 Encounters:  05/04/19 171 lb (77.6 kg)  02/03/19 174 lb (78.9 kg)  11/01/18 173 lb 6.4 oz (78.7 kg)    Physical Exam Vitals signs and nursing note reviewed.  Constitutional:      General: She is awake. She is not in acute distress.    Appearance: She is well-developed. She is obese. She is not ill-appearing.  HENT:     Head: Normocephalic.     Right Ear: Hearing, tympanic membrane, ear canal and external ear normal.     Left Ear: Hearing, tympanic membrane, ear canal and external ear normal.     Nose: Nose normal.      Mouth/Throat:     Mouth: Mucous membranes are moist.  Eyes:     General: Lids are normal.        Right eye: No discharge.        Left eye: No discharge.     Extraocular Movements: Extraocular movements intact.     Conjunctiva/sclera: Conjunctivae normal.     Pupils: Pupils are equal, round, and reactive to light.     Visual Fields: Right eye visual fields normal and left eye visual fields normal.  Neck:     Musculoskeletal: Normal range of motion and neck supple.     Thyroid: No thyromegaly.     Vascular: No carotid bruit or JVD.  Cardiovascular:     Rate and Rhythm: Normal rate and regular rhythm.     Heart sounds: Normal heart sounds. No murmur. No gallop.   Pulmonary:     Effort: Pulmonary effort is normal. No respiratory distress.     Breath sounds: Normal breath sounds.  Chest:     Breasts:        Right: Normal. No swelling, bleeding, inverted nipple, mass, nipple discharge, skin change or tenderness.        Left: Normal. No swelling, bleeding, inverted nipple, mass, nipple discharge, skin change or tenderness.  Abdominal:     General: Bowel sounds are normal.     Palpations: Abdomen is soft. There is no hepatomegaly or splenomegaly.  Musculoskeletal: Normal range of motion.     Right lower leg: No edema.     Left lower leg: No edema.  Lymphadenopathy:     Cervical: No cervical adenopathy.     Upper Body:     Right upper body: No supraclavicular, axillary or pectoral adenopathy.     Left upper body: No supraclavicular, axillary or  pectoral adenopathy.  Skin:    General: Skin is warm and dry.  Neurological:     Mental Status: She is alert and oriented to person, place, and time.     Cranial Nerves: Cranial nerves are intact.     Gait: Gait is intact.     Deep Tendon Reflexes: Reflexes are normal and symmetric.  Psychiatric:        Attention and Perception: Attention normal.        Mood and Affect: Mood normal.        Behavior: Behavior normal. Behavior is  cooperative.        Thought Content: Thought content normal.        Judgment: Judgment normal.    Diabetic Foot Exam - Simple   Simple Foot Form Visual Inspection No deformities, no ulcerations, no other skin breakdown bilaterally:  Yes Sensation Testing Intact to touch and monofilament testing bilaterally:  Yes Pulse Check Posterior Tibialis and Dorsalis pulse intact bilaterally:  Yes Comments     Results for orders placed or performed in visit on 05/04/19  Bayer DCA Hb A1c Waived  Result Value Ref Range   HB A1C (BAYER DCA - WAIVED) 6.8 <7.0 %  Microalbumin, Urine Waived  Result Value Ref Range   Microalb, Ur Waived 10 0 - 19 mg/L   Creatinine, Urine Waived 50 10 - 300 mg/dL   Microalb/Creat Ratio 30-300 (H) <30 mg/g  Lipid Panel Piccolo, Waived  Result Value Ref Range   Cholesterol Piccolo, Waived 160 <200 mg/dL   HDL Chol Piccolo, Waived 50 (L) >59 mg/dL   Triglycerides Piccolo,Waived 108 <150 mg/dL   Chol/HDL Ratio Piccolo,Waive 3.2 mg/dL   LDL Chol Calc Piccolo Waived 88 <100 mg/dL   VLDL Chol Calc Piccolo,Waive 22 <30 mg/dL      Assessment & Plan:   Problem List Items Addressed This Visit      Cardiovascular and Mediastinum   Essential hypertension, benign    Chronic, ongoing.  BP below goal today.  Continue current medication regimen at this time, have recommended she check BP consistently every morning and document.  May be able to reduce medications if BP remain tightly controlled.  Return in 3 months.        Relevant Orders   Comprehensive metabolic panel   CBC with Differential/Platelet     Digestive   GERD (gastroesophageal reflux disease)    Chronic, ongoing.  GI referral for colonoscopy and added recent increase in reflux to referral and past history of requiring upper GI and intervention, may require upper GI due to increased symptoms and may be able to obtain with colonoscopy.  Continue current medication regimen at this time.  Mag level next visit.           Endocrine   Hyperlipidemia associated with type 2 diabetes mellitus (HCC)    Chronic, ongoing.  LDL 88 and TCHOL 160.  Continue current medication regimen and adjust as needed during future visits to reach LDL goal 70 or less.  She wishes to focus on diet and exercise.  Return in 3 months..        Uncontrolled type 2 diabetes mellitus (HCC)    Chronic, ongoing.  A1C 6.8% today, decreased. Urine ALB 10 and A:C 30-300.  Continue current medication regimen.  Recommend she check blood sugar consistently twice a day and document for provider.  She is interested in CCM referral and more info.  Referral place.  Return in 3 months.  Relevant Orders   Bayer DCA Hb A1c Waived (Completed)   Microalbumin, Urine Waived (Completed)   Lipid Panel Piccolo, Waived (Completed)   Ambulatory referral to Chronic Care Management Services    Other Visit Diagnoses    Annual physical exam    -  Primary   Annual labs ordered   Colon cancer screening       GI referral   Relevant Orders   MM DIGITAL SCREENING BILATERAL   Breast cancer screening       Mammogram ordered   Relevant Orders   Ambulatory referral to Gastroenterology   Thyroid disorder screen       TSH ordered on labs   Relevant Orders   TSH       Follow up plan: Return in about 3 months (around 08/04/2019) for T2DM, HTN/HLD.   LABORATORY TESTING:  - Pap smear: not applicable, h/o complete hysterectomy  IMMUNIZATIONS:   - Tdap: Tetanus vaccination status reviewed: last tetanus booster within 10 years. - Influenza: Up to date - Pneumovax: Up to date - Prevnar: Up to date - HPV: Not applicable - Zostavax vaccine: Up to date  SCREENING: -Mammogram: Ordered today  - Colonoscopy: Ordered today  - Bone Density: Not applicable  -Hearing Test: Not applicable  -Spirometry: Not applicable   PATIENT COUNSELING:   Advised to take 1 mg of folate supplement per day if capable of pregnancy.   Sexuality: Discussed sexually  transmitted diseases, partner selection, use of condoms, avoidance of unintended pregnancy  and contraceptive alternatives.   Advised to avoid cigarette smoking.  I discussed with the patient that most people either abstain from alcohol or drink within safe limits (<=14/week and <=4 drinks/occasion for males, <=7/weeks and <= 3 drinks/occasion for females) and that the risk for alcohol disorders and other health effects rises proportionally with the number of drinks per week and how often a drinker exceeds daily limits.  Discussed cessation/primary prevention of drug use and availability of treatment for abuse.   Diet: Encouraged to adjust caloric intake to maintain  or achieve ideal body weight, to reduce intake of dietary saturated fat and total fat, to limit sodium intake by avoiding high sodium foods and not adding table salt, and to maintain adequate dietary potassium and calcium preferably from fresh fruits, vegetables, and low-fat dairy products.    stressed the importance of regular exercise  Injury prevention: Discussed safety belts, safety helmets, smoke detector, smoking near bedding or upholstery.   Dental health: Discussed importance of regular tooth brushing, flossing, and dental visits.    NEXT PREVENTATIVE PHYSICAL DUE IN 1 YEAR. Return in about 3 months (around 08/04/2019) for T2DM, HTN/HLD.

## 2019-05-04 NOTE — Assessment & Plan Note (Signed)
Chronic, ongoing.  LDL 88 and TCHOL 160.  Continue current medication regimen and adjust as needed during future visits to reach LDL goal 70 or less.  She wishes to focus on diet and exercise.  Return in 3 months.Marland Kitchen

## 2019-05-04 NOTE — Assessment & Plan Note (Signed)
Chronic, ongoing.  GI referral for colonoscopy and added recent increase in reflux to referral and past history of requiring upper GI and intervention, may require upper GI due to increased symptoms and may be able to obtain with colonoscopy.  Continue current medication regimen at this time.  Mag level next visit.

## 2019-05-04 NOTE — Assessment & Plan Note (Signed)
Chronic, ongoing.  A1C 6.8% today, decreased. Urine ALB 10 and A:C 30-300.  Continue current medication regimen.  Recommend she check blood sugar consistently twice a day and document for provider.  She is interested in CCM referral and more info.  Referral place.  Return in 3 months.

## 2019-05-04 NOTE — Assessment & Plan Note (Signed)
Chronic, ongoing.  BP below goal today.  Continue current medication regimen at this time, have recommended she check BP consistently every morning and document.  May be able to reduce medications if BP remain tightly controlled.  Return in 3 months.

## 2019-05-05 ENCOUNTER — Telehealth: Payer: Self-pay | Admitting: Gastroenterology

## 2019-05-05 LAB — COMPREHENSIVE METABOLIC PANEL
ALT: 16 IU/L (ref 0–32)
AST: 17 IU/L (ref 0–40)
Albumin/Globulin Ratio: 2.5 — ABNORMAL HIGH (ref 1.2–2.2)
Albumin: 4.8 g/dL (ref 3.8–4.8)
Alkaline Phosphatase: 67 IU/L (ref 39–117)
BUN/Creatinine Ratio: 17 (ref 12–28)
BUN: 13 mg/dL (ref 8–27)
Bilirubin Total: 0.5 mg/dL (ref 0.0–1.2)
CO2: 23 mmol/L (ref 20–29)
Calcium: 9.8 mg/dL (ref 8.7–10.3)
Chloride: 102 mmol/L (ref 96–106)
Creatinine, Ser: 0.75 mg/dL (ref 0.57–1.00)
GFR calc Af Amer: 97 mL/min/{1.73_m2} (ref >59)
GFR calc non Af Amer: 85 mL/min/{1.73_m2} (ref >59)
Globulin, Total: 1.9 g/dL (ref 1.5–4.5)
Glucose: 144 mg/dL — ABNORMAL HIGH (ref 65–99)
Potassium: 4.1 mmol/L (ref 3.5–5.2)
Sodium: 142 mmol/L (ref 134–144)
Total Protein: 6.7 g/dL (ref 6.0–8.5)

## 2019-05-05 LAB — TSH: TSH: 1.68 u[IU]/mL (ref 0.450–4.500)

## 2019-05-05 LAB — CBC WITH DIFFERENTIAL/PLATELET

## 2019-05-05 NOTE — Telephone Encounter (Signed)
I have called patient & l/m asking her to call & schedule an appointment (DR Vicente Males) for an office visit to discuss reflux & colonoscopy.

## 2019-05-10 ENCOUNTER — Encounter: Payer: Self-pay | Admitting: *Deleted

## 2019-05-14 ENCOUNTER — Other Ambulatory Visit: Payer: Self-pay | Admitting: Nurse Practitioner

## 2019-05-14 DIAGNOSIS — E785 Hyperlipidemia, unspecified: Secondary | ICD-10-CM

## 2019-05-14 NOTE — Telephone Encounter (Signed)
Requested Prescriptions  Pending Prescriptions Disp Refills  . pravastatin (PRAVACHOL) 40 MG tablet [Pharmacy Med Name: PRAVASTATIN SODIUM 40 MG TAB] 90 tablet 0    Sig: TAKE 1 TABLET BY MOUTH EVERYDAY AT BEDTIME     Cardiovascular:  Antilipid - Statins Failed - 05/14/2019 12:56 AM      Failed - HDL in normal range and within 360 days    HDL  Date Value Ref Range Status  01/22/2018 48 >39 mg/dL Final         Passed - Total Cholesterol in normal range and within 360 days    Cholesterol Piccolo, Waived  Date Value Ref Range Status  05/04/2019 160 <200 mg/dL Final    Comment:                            Desirable                <200                         Borderline High      200- 239                         High                     >239          Passed - LDL in normal range and within 360 days    LDL Calculated  Date Value Ref Range Status  01/22/2018 83 0 - 99 mg/dL Final         Passed - Triglycerides in normal range and within 360 days    Triglycerides Piccolo,Waived  Date Value Ref Range Status  05/04/2019 108 <150 mg/dL Final    Comment:                            Normal                   <150                         Borderline High     150 - 199                         High                200 - 499                         Very High                >499          Passed - Patient is not pregnant      Passed - Valid encounter within last 12 months    Recent Outpatient Visits          1 week ago Annual physical exam   [name], Jolene T, NP   3 months ago Uncontrolled type 2 diabetes mellitus with hyperglycemia ([name])   [place], Jolene T, NP   6 months ago Uncontrolled type 2 diabetes mellitus with hyperglycemia ([name])   [name], [place] T, NP   8 months ago Uncontrolled type 2 diabetes mellitus  with hyperglycemia [name])   [place], [place], PA-C   9 months ago  Uncontrolled type 2 diabetes mellitus with hyperglycemia [name])   [name], PA-C      Future Appointments            In 2 months Cannady, Barbaraann Faster, NP [employer]

## 2019-05-17 ENCOUNTER — Ambulatory Visit: Payer: Self-pay | Admitting: Pharmacist

## 2019-05-17 ENCOUNTER — Other Ambulatory Visit: Payer: Self-pay | Admitting: Nurse Practitioner

## 2019-05-17 ENCOUNTER — Telehealth: Payer: Self-pay

## 2019-05-17 DIAGNOSIS — E1165 Type 2 diabetes mellitus with hyperglycemia: Secondary | ICD-10-CM

## 2019-05-17 DIAGNOSIS — I10 Essential (primary) hypertension: Secondary | ICD-10-CM

## 2019-05-17 NOTE — Chronic Care Management (AMB) (Signed)
  Chronic Care Management   Note  05/17/2019 Name: Grissel Tyrell MRN: [id number] DOB: [birth year]  Lorrena Goranson is a 62 y.o. year old female who is a primary care patient of Cannady, Barbaraann Faster, NP. The CCM team was consulted for assistance with chronic disease management and care coordination needs.    Contacted patient to discuss CCM program. Left HIPAA compliant message for patient to return my call at her convenience.   Follow up plan: - Will outreach again within a week  Catie Darnelle Maffucci, PharmD Clinical Pharmacist [place] [phone]

## 2019-05-18 ENCOUNTER — Telehealth: Payer: Self-pay

## 2019-05-18 NOTE — Telephone Encounter (Signed)
I l/m asking patient to return call to schedule an appt with Dr Vicente Males for reflux & colonoscopy.

## 2019-05-20 ENCOUNTER — Other Ambulatory Visit: Payer: Self-pay | Admitting: Unknown Physician Specialty

## 2019-05-25 ENCOUNTER — Other Ambulatory Visit: Payer: Self-pay | Admitting: Nurse Practitioner

## 2019-05-25 ENCOUNTER — Ambulatory Visit: Payer: Self-pay | Admitting: Pharmacist

## 2019-05-25 DIAGNOSIS — I10 Essential (primary) hypertension: Secondary | ICD-10-CM

## 2019-05-25 DIAGNOSIS — E1165 Type 2 diabetes mellitus with hyperglycemia: Secondary | ICD-10-CM

## 2019-05-25 NOTE — Chronic Care Management (AMB) (Signed)
Chronic Care Management   Note  05/25/2019 Name: Christine Booth MRN: [id number] DOB: [birth year]   Subjective:  Christine Booth is a 63 y.o. year old female who is a primary care patient of Cannady, Barbaraann Faster, NP. The CCM team was consulted for assistance with chronic disease management and care coordination needs.    Contacted patient today to discuss CCM team and medication review.   Review of patient status, including review of consultants reports, laboratory and other test data, was performed as part of comprehensive evaluation and provision of chronic care management services.   Objective:  Lab Results  Component Value Date   CREATININE 0.75 05/04/2019   CREATININE 0.73 07/27/2018   CREATININE 0.62 04/23/2018    Lab Results  Component Value Date   HGBA1C 6.8 05/04/2019       Component Value Date/Time   CHOL 160 05/04/2019 0839   TRIG 108 05/04/2019 0839   HDL 48 01/22/2018 0829   VLDL 22 05/04/2019 0839   LDLCALC 83 01/22/2018 0829    Clinical ASCVD: No  The 10-year ASCVD risk score Mikey Bussing DC Jr., et al., 2013) is: 8.4%   Values used to calculate the score:     Age: 67 years     Sex: Female     Is Non-Hispanic African American: No     Diabetic: Yes     Tobacco smoker: No     Systolic Blood Pressure: 952 mmHg     Is BP treated: Yes     HDL Cholesterol: 50 mg/dL     Total Cholesterol: 160 mg/dL    BP Readings from Last 3 Encounters:  05/04/19 108/68  02/03/19 130/80  11/01/18 108/71    No Known Allergies  Medications Reviewed Today    Reviewed by De Hollingshead, [facility] (Pharmacist) on 05/25/19 at 1202  Med List Status: <None>  Medication Order Taking? Sig Documenting Provider Last Dose Status Informant  aspirin EC 81 MG tablet [id number] Yes Take 81 mg by mouth daily. [provider] Taking Active   Biotin 300 MCG TABS [id number] Yes Take 1 tablet by mouth daily. [provider] Taking Active   cetirizine (ZYRTEC) 10 MG tablet  [id number] Yes Take 10 mg by mouth daily. [provider] Taking Active   fluticasone (FLONASE) 50 MCG/ACT nasal spray [id number] Yes SPRAY 2 SPRAYS INTO EACH NOSTRIL EVERY DAY Cannady, Jolene T, NP Taking Active   glucose blood test strip [id number] Yes Use as instructed Kathrine Haddock, NP Taking Active   hydrochlorothiazide (HYDRODIURIL) 12.5 MG tablet [id number] Yes TAKE 1 TABLET BY MOUTH EVERY DAY Cannady, Jolene T, NP Taking Active   INVOKANA 300 MG TABS tablet [id number] Yes TAKE 1 TABLET BY MOUTH EVERY DAY BEFORE BREAKFAST Cannady, Jolene T, NP Taking Active   losartan (COZAAR) 100 MG tablet [id number] Yes TAKE 1 TABLET BY MOUTH EVERY DAY Cannady, Jolene T, NP Taking Active   mupirocin ointment (BACTROBAN) 2 % [id number] No Place 1 application into the nose 2 (two) times daily.  Patient not taking: Reported on 05/25/2019   Marnee Guarneri T, NP Not Taking Active   OZEMPIC, 1 MG/DOSE, 2 MG/1.5ML SOPN [id number] Yes INJECT 0.75 MG INTO THE SKIN ONCE A WEEK.  Patient taking differently: Place 1 mg into alternate nostrils.    Marnee Guarneri T, NP Taking Active   pantoprazole (PROTONIX) 40 MG tablet [id number] Yes TAKE 1 TABLET BY MOUTH EVERY DAY Cannady, Jolene T, NP Taking Active   pravastatin (PRAVACHOL) 40 MG tablet  [id number] Yes TAKE 1 TABLET BY MOUTH EVERYDAY AT BEDTIME Cannady, Jolene T, NP Taking Active   saxagliptin HCl (ONGLYZA) 5 MG TABS tablet [id number] Yes TAKE 1 TABLET BY MOUTH EVERY DAY Kathrine Haddock, NP Taking Active   triamcinolone (KENALOG) 0.025 % cream [id number] No Apply 1 application topically 2 (two) times daily.  Patient not taking: Reported on 05/25/2019   [name], NP Not Taking Active            Assessment:   Goals Addressed            This Visit's Progress     Patient Stated   . "I want to keep my blood pressure in check" (pt-stated)       Current Barriers:  . Hypertension, currently well managed on losartan 50 mg daily and HCTZ 12.5 mg daily.  Notes that she takes losartan in the morning and HCTZ in the evening. Endorses nocturnal diuresis . She is not checking BP regularly, but does have a BP cuff at home  Pharmacist Clinical Goal(s):  Marland Kitchen Over the next 90 days, patient will work with PharmD and primary care provider to address needs related to optimized management of hypertension  Interventions: . Comprehensive medication review performed. . Recommended patient move HCTZ to AM to prevent nocturnal diuresis, and change losartan to 50 mg in the evenings due to recent literature showing cardiovascular benefit of dosing at least 1 antihypertensive medication at bedtime . Encouraged patient to find BP cuff and start checking BP periodically to evaluate home BP vs office BP  Patient Self Care Activities:  . Self administers medications as prescribed . Will start checking BP occasionally and documenting  Initial goal documentation     . "I want to work on my diabetes" (pt-stated)       Current Barriers:  Marland Kitchen Knowledge Deficits related to management of diabetes . Controlled (most recent A1c 6.8%) on Ozempic 1 mg weekly, Onglyza 5 mg daily, Invokana 300 mg daily o Hx GI intolerance to metformin; noted that it sometimes caused diarrhea, sometimes constipation; however, notes current issues with constipation requiring daily senna + PRN miralax; plans to schedule GI appointment  . Current meal choices:  o Breakfast: [place], leftovers o Lunch: takes leftovers from Sunday o Supper: sandwich, salad  o Drinks: water, 1 diet pepsi  o Snacks: sometimes pack of crackers, sometimes piece  o Exercise: nothing right now because the gym being closed; but nothing right now  o Very sporadically checking BG and not checking BP at home right now  Pharmacist Clinical Goal(s):  Marland Kitchen Over the next 90 days days, patient will work with PharmD and primary care provider to address needs related to optimized management of diabetes  Interventions: .  Comprehensive medication review performed. . Due to duplicative mechanism of action of Ozempic and Onglyza, recommend discontinuation of Onglyza, as it likely isn't providing any benefit. Will discuss with Marnee Guarneri, NP . Discussed diet/lifestyle modifications to maintain. Recommend 150 minutes moderate intensity exercise weekly.  . Recommended to check BG to keep an eye on blood sugars.   Patient Self Care Activities:  . Self administers medications as prescribed . Calls provider office for new concerns or questions   Initial goal documentation        Plan: - Will collaborate with primary care provider on medication changes recommended above.  - Will outreach patient in 4-5 weeks for continued medication management support   Catie Darnelle Maffucci, PharmD Clinical Pharmacist [place]  [facility] [phone]

## 2019-05-25 NOTE — Patient Instructions (Signed)
Visit Information  Goals Addressed            This Visit's Progress     Patient Stated   . "I want to keep my blood pressure in check" (pt-stated)       Current Barriers:  . Hypertension, currently well managed on losartan 50 mg daily and HCTZ 12.5 mg daily. Notes that she takes losartan in the morning and HCTZ in the evening. Endorses nocturnal diuresis . She is not checking BP regularly, but does have a BP cuff at home  Pharmacist Clinical Goal(s):  Marland Kitchen Over the next 90 days, patient will work with PharmD and primary care provider to address needs related to optimized management of hypertension  Interventions: . Comprehensive medication review performed. . Recommended patient move HCTZ to AM to prevent nocturnal diuresis, and change losartan to 50 mg in the evenings due to recent literature showing cardiovascular benefit of dosing at least 1 antihypertensive medication at bedtime . Encouraged patient to find BP cuff and start checking BP periodically to evaluate home BP vs office BP  Patient Self Care Activities:  . Self administers medications as prescribed . Will start checking BP occasionally and documenting  Initial goal documentation     . "I want to work on my diabetes" (pt-stated)       Current Barriers:  Marland Kitchen Knowledge Deficits related to management of diabetes . Controlled (most recent A1c 6.8%) on Ozempic 1 mg weekly, Onglyza 5 mg daily, Invokana 300 mg daily o Hx GI intolerance to metformin; noted that it sometimes caused diarrhea, sometimes constipation; however, notes current issues with constipation requiring daily senna + PRN miralax; plans to schedule GI appointment  . Current meal choices:  o Breakfast: [place], leftovers o Lunch: takes leftovers from Sunday o Supper: sandwich, salad  o Drinks: water, 1 diet pepsi  o Snacks: sometimes pack of crackers, sometimes piece  o Exercise: nothing right now because the gym being closed; but nothing right now   o Very sporadically checking BG and not checking BP at home right now  Pharmacist Clinical Goal(s):  Marland Kitchen Over the next 90 days days, patient will work with PharmD and primary care provider to address needs related to optimized management of diabetes  Interventions: . Comprehensive medication review performed. . Due to duplicative mechanism of action of Ozempic and Onglyza, recommend discontinuation of Onglyza, as it likely isn't providing any benefit. Will discuss with Marnee Guarneri, NP . Discussed diet/lifestyle modifications to maintain. Recommend 150 minutes moderate intensity exercise weekly.  . Recommended to check BG to keep an eye on blood sugars.   Patient Self Care Activities:  . Self administers medications as prescribed . Calls provider office for new concerns or questions   Initial goal documentation        The patient verbalized understanding of instructions provided today and declined a print copy of patient instruction materials.   Plan: - Will collaborate with primary care provider on medication changes recommended above.  - Will outreach patient in 4-5 weeks for continued medication management support   Catie Darnelle Maffucci, PharmD Clinical Pharmacist [place] [phone]

## 2019-06-20 ENCOUNTER — Other Ambulatory Visit: Payer: Self-pay | Admitting: Nurse Practitioner

## 2019-06-20 ENCOUNTER — Telehealth: Payer: Self-pay

## 2019-06-20 MED ORDER — OZEMPIC (1 MG/DOSE) 2 MG/1.5ML ~~LOC~~ SOPN: 1.0000 mg | PEN_INJECTOR | SUBCUTANEOUS | 2 refills | Status: DC

## 2019-06-20 NOTE — Telephone Encounter (Signed)
Fixed rx and resent as patient is taking 1 MG weekly

## 2019-06-20 NOTE — Telephone Encounter (Signed)
Pharmacy sent a fax stating that the need clarification on the dosage and directions on the Ozempic pen. They state that you cannot get 0.75 mg out of a 1 mg pen. Please advise and resend to the pharmacy.

## 2019-06-25 ENCOUNTER — Other Ambulatory Visit: Payer: Self-pay | Admitting: Nurse Practitioner

## 2019-06-28 ENCOUNTER — Telehealth: Payer: Self-pay

## 2019-07-02 ENCOUNTER — Other Ambulatory Visit: Payer: Self-pay | Admitting: Nurse Practitioner

## 2019-07-05 ENCOUNTER — Ambulatory Visit: Payer: Self-pay | Admitting: Pharmacist

## 2019-07-05 DIAGNOSIS — E1165 Type 2 diabetes mellitus with hyperglycemia: Secondary | ICD-10-CM

## 2019-07-05 NOTE — Chronic Care Management (AMB) (Signed)
  Chronic Care Management   Follow Up Note   07/05/2019 Name: Christine Booth MRN: [id number] DOB: August 22, 1956  Referred by: [name], NP Reason for referral : Chronic Care Management (Medication Management)   Christine Booth is a 63 y.o. year old female who is a primary care patient of Cannady, Barbaraann Faster, NP. The CCM team was consulted for assistance with chronic disease management and care coordination needs.    Contacted patient telephonically today for medication management support.   Review of patient status, including review of consultants reports, relevant laboratory and other test results, and collaboration with appropriate care team members and the patient's provider was performed as part of comprehensive patient evaluation and provision of chronic care management services.    Goals Addressed            This Visit's Progress     Patient Stated   . "I want to keep my blood pressure in check" (pt-stated)       Current Barriers:  . Hypertension, currently well managed on losartan 50 mg daily and HCTZ 12.5 mg daily. At last visit, we identified that she was taking HCTZ QAM and losartan QAM. Recommended she switch d/t nocturia o Patient confirms she switched, endorses reduced nocturia  o Reports that she bought a new BP cuff, SBP always <140, generally in 120-130s  Pharmacist Clinical Goal(s):  Marland Kitchen Over the next 90 days, patient will work with PharmD and primary care provider to address needs related to optimized management of hypertension  Interventions: . Congratulated patient on optimized regimen and improved monitoring. Encouraged to bring home BP results to upcoming appointment with Marnee Guarneri, NP  Patient Self Care Activities:  . Self administers medications as prescribed . Will continue checking BP occasionally and documenting  Please see past updates related to this goal by clicking on the "Past Updates" button in the selected goal      . "I  want to work on my diabetes" (pt-stated)       Current Barriers:  Marland Kitchen Knowledge Deficits related to management of diabetes . Controlled (most recent A1c 6.8%) on Ozempic 1 mg weekly and Invokana 300 mg daily; d/c Onglyza after our last discussion o Hx GI intolerance to metformin;  o Reports BG remain as they were before, generally 150s QAM o Started taking a stool softener senna + docusate, and constipation has improved   Pharmacist Clinical Goal(s):  Marland Kitchen Over the next 90 days days, patient will work with PharmD and primary care provider to address needs related to optimized management of diabetes  Interventions: . Congratulated patient on continued focus on management of diabetes. Encouraged to continue to check BG and bring results to next appointment with primary care provider   Patient Self Care Activities:  . Self administers medications as prescribed . Calls provider office for new concerns or questions   Please see past updates related to this goal by clicking on the "Past Updates" button in the selected goal          Plan:  - Patient has primary care provider appointment 08/09/2019; will outreach for continued medication management support 4-5 weeks after that  Christine Booth, PharmD Clinical Pharmacist [place] [phone]

## 2019-07-05 NOTE — Patient Instructions (Signed)
Visit Information  Goals Addressed            This Visit's Progress     Patient Stated   . "I want to keep my blood pressure in check" (pt-stated)       Current Barriers:  . Hypertension, currently well managed on losartan 50 mg daily and HCTZ 12.5 mg daily. At last visit, we identified that she was taking HCTZ QAM and losartan QAM. Recommended she switch d/t nocturia o Patient confirms she switched, endorses reduced nocturia  o Reports that she bought a new BP cuff, SBP always <140, generally in 120-130s  Pharmacist Clinical Goal(s):  Marland Kitchen Over the next 90 days, patient will work with PharmD and primary care provider to address needs related to optimized management of hypertension  Interventions: . Congratulated patient on optimized regimen and improved monitoring. Encouraged to bring home BP results to upcoming appointment with Marnee Guarneri, NP  Patient Self Care Activities:  . Self administers medications as prescribed . Will continue checking BP occasionally and documenting  Please see past updates related to this goal by clicking on the "Past Updates" button in the selected goal      . "I want to work on my diabetes" (pt-stated)       Current Barriers:  Marland Kitchen Knowledge Deficits related to management of diabetes . Controlled (most recent A1c 6.8%) on Ozempic 1 mg weekly and Invokana 300 mg daily; d/c Onglyza after our last discussion o Hx GI intolerance to metformin;  o Reports BG remain as they were before, generally 150s QAM o Started taking a stool softener senna + docusate, and constipation has improved   Pharmacist Clinical Goal(s):  Marland Kitchen Over the next 90 days days, patient will work with PharmD and primary care provider to address needs related to optimized management of diabetes  Interventions: . Congratulated patient on continued focus on management of diabetes. Encouraged to continue to check BG and bring results to next appointment with primary care provider   Patient  Self Care Activities:  . Self administers medications as prescribed . Calls provider office for new concerns or questions   Please see past updates related to this goal by clicking on the "Past Updates" button in the selected goal         The patient verbalized understanding of instructions provided today and declined a print copy of patient instruction materials.   Plan:  - Patient has primary care provider appointment 08/09/2019; will outreach for continued medication management support 4-5 weeks after that  Catie Darnelle Maffucci, PharmD Clinical Pharmacist [place] [phone]

## 2019-08-05 ENCOUNTER — Other Ambulatory Visit: Payer: Self-pay | Admitting: Nurse Practitioner

## 2019-08-05 DIAGNOSIS — E785 Hyperlipidemia, unspecified: Secondary | ICD-10-CM

## 2019-08-05 NOTE — Telephone Encounter (Signed)
Forwarding medication refill request to PCP for review. 

## 2019-08-08 ENCOUNTER — Other Ambulatory Visit: Payer: Self-pay | Admitting: Nurse Practitioner

## 2019-08-08 DIAGNOSIS — E1165 Type 2 diabetes mellitus with hyperglycemia: Secondary | ICD-10-CM

## 2019-08-08 DIAGNOSIS — I10 Essential (primary) hypertension: Secondary | ICD-10-CM

## 2019-08-09 ENCOUNTER — Ambulatory Visit (INDEPENDENT_AMBULATORY_CARE_PROVIDER_SITE_OTHER): Payer: BLUE CROSS/BLUE SHIELD | Admitting: Nurse Practitioner

## 2019-08-09 ENCOUNTER — Encounter: Payer: Self-pay | Admitting: Nurse Practitioner

## 2019-08-09 ENCOUNTER — Other Ambulatory Visit: Payer: Self-pay

## 2019-08-09 VITALS — BP 110/65 | HR 79 | Temp 98.7°F | Wt 166.2 lb

## 2019-08-09 DIAGNOSIS — E785 Hyperlipidemia, unspecified: Secondary | ICD-10-CM

## 2019-08-09 DIAGNOSIS — E1165 Type 2 diabetes mellitus with hyperglycemia: Secondary | ICD-10-CM | POA: Diagnosis not present

## 2019-08-09 DIAGNOSIS — I10 Essential (primary) hypertension: Secondary | ICD-10-CM | POA: Diagnosis not present

## 2019-08-09 DIAGNOSIS — K219 Gastro-esophageal reflux disease without esophagitis: Secondary | ICD-10-CM | POA: Diagnosis not present

## 2019-08-09 DIAGNOSIS — E1169 Type 2 diabetes mellitus with other specified complication: Secondary | ICD-10-CM

## 2019-08-09 NOTE — Patient Instructions (Signed)
Carbohydrate Counting for Diabetes Mellitus, Adult  Carbohydrate counting is a method of keeping track of how many carbohydrates you eat. Eating carbohydrates naturally increases the amount of sugar (glucose) in the blood. Counting how many carbohydrates you eat helps keep your blood glucose within normal limits, which helps you manage your diabetes (diabetes mellitus). It is important to know how many carbohydrates you can safely have in each meal. This is different for every person. A diet and nutrition specialist (registered dietitian) can help you make a meal plan and calculate how many carbohydrates you should have at each meal and snack. Carbohydrates are found in the following foods:  Grains, such as breads and cereals.  Dried beans and soy products.  Starchy vegetables, such as potatoes, peas, and corn.  Fruit and fruit juices.  Milk and yogurt.  Sweets and snack foods, such as cake, cookies, candy, chips, and soft drinks. How do I count carbohydrates? There are two ways to count carbohydrates in food. You can use either of the methods or a combination of both. Reading "Nutrition Facts" on packaged food The "Nutrition Facts" list is included on the labels of almost all packaged foods and beverages in the [place] It includes:  The serving size.  Information about nutrients in each serving, including the grams (g) of carbohydrate per serving. To use the "Nutrition Facts":  Decide how many servings you will have.  Multiply the number of servings by the number of carbohydrates per serving.  The resulting number is the total amount of carbohydrates that you will be having. Learning standard serving sizes of other foods When you eat carbohydrate foods that are not packaged or do not include "Nutrition Facts" on the label, you need to measure the servings in order to count the amount of carbohydrates:  Measure the foods that you will eat with a food scale or measuring cup, if needed.   Decide how many standard-size servings you will eat.  Multiply the number of servings by 15. Most carbohydrate-rich foods have about 15 g of carbohydrates per serving. ? For example, if you eat 8 oz (170 g) of strawberries, you will have eaten 2 servings and 30 g of carbohydrates (2 servings x 15 g = 30 g).  For foods that have more than one food mixed, such as soups and casseroles, you must count the carbohydrates in each food that is included. The following list contains standard serving sizes of common carbohydrate-rich foods. Each of these servings has about 15 g of carbohydrates:   hamburger bun or  English muffin.   oz (15 mL) syrup.   oz (14 g) jelly.  1 slice of bread.  1 six-inch tortilla.  3 oz (85 g) cooked rice or pasta.  4 oz (113 g) cooked dried beans.  4 oz (113 g) starchy vegetable, such as peas, corn, or potatoes.  4 oz (113 g) hot cereal.  4 oz (113 g) mashed potatoes or  of a large baked potato.  4 oz (113 g) canned or frozen fruit.  4 oz (120 mL) fruit juice.  4-6 crackers.  6 chicken nuggets.  6 oz (170 g) unsweetened dry cereal.  6 oz (170 g) plain fat-free yogurt or yogurt sweetened with artificial sweeteners.  8 oz (240 mL) milk.  8 oz (170 g) fresh fruit or one small piece of fruit.  24 oz (680 g) popped popcorn. Example of carbohydrate counting Sample meal  3 oz (85 g) chicken breast.  6 oz (170 g)   brown rice.  4 oz (113 g) corn.  8 oz (240 mL) milk.  8 oz (170 g) strawberries with sugar-free whipped topping. Carbohydrate calculation 1. Identify the foods that contain carbohydrates: ? Rice. ? Corn. ? Milk. ? Strawberries. 2. Calculate how many servings you have of each food: ? 2 servings rice. ? 1 serving corn. ? 1 serving milk. ? 1 serving strawberries. 3. Multiply each number of servings by 15 g: ? 2 servings rice x 15 g = 30 g. ? 1 serving corn x 15 g = 15 g. ? 1 serving milk x 15 g = 15 g. ? 1 serving  strawberries x 15 g = 15 g. 4. Add together all of the amounts to find the total grams of carbohydrates eaten: ? 30 g + 15 g + 15 g + 15 g = 75 g of carbohydrates total. Summary  Carbohydrate counting is a method of keeping track of how many carbohydrates you eat.  Eating carbohydrates naturally increases the amount of sugar (glucose) in the blood.  Counting how many carbohydrates you eat helps keep your blood glucose within normal limits, which helps you manage your diabetes.  A diet and nutrition specialist (registered dietitian) can help you make a meal plan and calculate how many carbohydrates you should have at each meal and snack. This information is not intended to replace advice given to you by your health care provider. Make sure you discuss any questions you have with your health care provider. Document Released: 11/24/2005 Document Revised: 06/18/2017 Document Reviewed: 05/07/2016 Elsevier Patient Education  2020 [employer]  

## 2019-08-09 NOTE — Progress Notes (Signed)
BP 110/65 Comment: pt reported  Pulse 79   Temp 98.7 F (37.1 C) (Oral)   Wt 166 lb 3.2 oz (75.4 kg)   LMP  (LMP Unknown)   BMI 32.00 kg/m    Subjective:    Patient ID: Christine Booth, female    DOB: January 17, 1956, 63 y.o.   MRN: [id number]  HPI: Christine Booth is a 63 y.o. female  Chief Complaint  Patient presents with  . Diabetes  . Hyperlipidemia  . Hypertension    . This visit was completed via Doximity due to the restrictions of the COVID-19 pandemic. All issues as above were discussed and addressed. Physical exam was done as above through visual confirmation on Doximity. If it was felt that the patient should be evaluated in the office, they were directed there. The patient verbally consented to this visit. . Location of the patient: home . Location of the provider: home . Those involved with this call:  . Provider: Marnee Guarneri, DNP . CMA: Yvonna Alanis, CMA . Front Desk/Registration: Jill Side  . Time spent on call: 15 minutes with patient face to face via video conference. More than 50% of this time was spent in counseling and coordination of care. 10 minutes total spent in review of patient's record and preparation of their chart.  . I verified patient identity using two factors (patient name and date of birth). Patient consents verbally to being seen via telemedicine visit today.    DIABETES Continues on  Invokana and Ozempic, Onglyza discontinued in June due to duplicative mechanism of Ozempic and Onglyza.  Currently working along with CCM team.  Last A1C improved with Ozempic increased to 1 MG in February, A1C May 6.8%, downward trend from 7.6%.  Has not been exercising, but [employer] is opening back up again, so plans on starting exercise regimen again which helped her before. Hypoglycemic episodes:no Polydipsia/polyuria: no Visual disturbance: no Chest pain: no Paresthesias: no Glucose Monitoring: yes  Accucheck frequency: Daily   Fasting glucose: average 178 in the morning  Post prandial:  Evening:  Before meals: Taking Insulin?: no  Long acting insulin:  Short acting insulin: Blood Pressure Monitoring: daily Retinal Examination: Not up to Date Foot Exam: Up to Date Pneumovax: Up to Date Influenza: Up to Date Aspirin: yes   HYPERTENSION / HYPERLIPIDEMIA Continues on HCTZ, Losartan, and Pravastatin. Satisfied with current treatment? yes Duration of hypertension: chronic BP monitoring frequency: daily BP range: 110-120/70-80 range BP medication side effects: no Duration of hyperlipidemia: chronic Cholesterol medication side effects: no Cholesterol supplements: none Medication compliance: good compliance Aspirin: yes Recent stressors: no Recurrent headaches: no Visual changes: no Palpitations: no Dyspnea: no Chest pain: no Lower extremity edema: no Dizzy/lightheaded: no   GERD Continues on Protonix daily, no recent Mag level noted.  Does report she had a recent bout of diverticulitis after eating lots of nuts (poppy seeds, sunflower seeds, and peanuts), this was 2 weeks ago and she rested at home.  Symptoms went away on own without abx treatment.   GERD control status: controlled  Satisfied with current treatment? yes Heartburn frequency: none, well-controlled with Protonix Medication side effects: no  Medication compliance: stable Previous GERD medications: TUMS Antacid use frequency:  none Alleviatiating factors:  medication Aggravating factors: Spicy and greasy foods Dysphagia: no Odynophagia:  no Hematemesis: no Blood in stool: no EGD: yes  Relevant past medical, surgical, family and social history reviewed and updated as indicated. Interim medical history since our last visit reviewed.  Allergies and medications reviewed and updated.  Review of Systems  Constitutional: Negative for activity change, appetite change, diaphoresis, fatigue and fever.  Respiratory: Negative for cough,  chest tightness and shortness of breath.   Cardiovascular: Negative for chest pain, palpitations and leg swelling.  Gastrointestinal: Negative for abdominal distention, abdominal pain, constipation, diarrhea, nausea and vomiting.  Endocrine: Negative for cold intolerance, heat intolerance, polydipsia, polyphagia and polyuria.  Neurological: Negative for dizziness, syncope, weakness, light-headedness, numbness and headaches.  Psychiatric/Behavioral: Negative.     Per HPI unless specifically indicated above     Objective:    BP 110/65 Comment: pt reported  Pulse 79   Temp 98.7 F (37.1 C) (Oral)   Wt 166 lb 3.2 oz (75.4 kg)   LMP  (LMP Unknown)   BMI 32.00 kg/m   Wt Readings from Last 3 Encounters:  08/09/19 166 lb 3.2 oz (75.4 kg)  05/04/19 171 lb (77.6 kg)  02/03/19 174 lb (78.9 kg)    Physical Exam Vitals signs and nursing note reviewed.  Constitutional:      General: She is awake. She is not in acute distress.    Appearance: She is well-developed. She is not ill-appearing.  HENT:     Head: Normocephalic.     Right Ear: Hearing normal.     Left Ear: Hearing normal.  Eyes:     General: Lids are normal.        Right eye: No discharge.        Left eye: No discharge.     Conjunctiva/sclera: Conjunctivae normal.  Neck:     Musculoskeletal: Normal range of motion.  Pulmonary:     Effort: Pulmonary effort is normal. No accessory muscle usage or respiratory distress.  Neurological:     Mental Status: She is alert and oriented to person, place, and time.  Psychiatric:        Attention and Perception: Attention normal.        Mood and Affect: Mood normal.        Behavior: Behavior normal. Behavior is cooperative.        Thought Content: Thought content normal.        Judgment: Judgment normal.     Results for orders placed or performed in visit on 05/04/19  Bayer DCA Hb A1c Waived  Result Value Ref Range   HB A1C (BAYER DCA - WAIVED) 6.8 <7.0 %  Microalbumin, Urine  Waived  Result Value Ref Range   Microalb, Ur Waived 10 0 - 19 mg/L   Creatinine, Urine Waived 50 10 - 300 mg/dL   Microalb/Creat Ratio 30-300 (H) <30 mg/g  Lipid Panel Piccolo, Waived  Result Value Ref Range   Cholesterol Piccolo, Waived 160 <200 mg/dL   HDL Chol Piccolo, Waived 50 (L) >59 mg/dL   Triglycerides Piccolo,Waived 108 <150 mg/dL   Chol/HDL Ratio Piccolo,Waive 3.2 mg/dL   LDL Chol Calc Piccolo Waived 88 <100 mg/dL   VLDL Chol Calc Piccolo,Waive 22 <30 mg/dL  Comprehensive metabolic panel  Result Value Ref Range   Glucose 144 (H) 65 - 99 mg/dL   BUN 13 8 - 27 mg/dL   Creatinine, Ser 0.75 0.57 - 1.00 mg/dL   GFR calc non Af Amer 85 >59 mL/min/1.73   GFR calc Af Amer 97 >59 mL/min/1.73   BUN/Creatinine Ratio 17 12 - 28   Sodium 142 134 - 144 mmol/L   Potassium 4.1 3.5 - 5.2 mmol/L   Chloride 102 96 - 106 mmol/L   CO2  23 20 - 29 mmol/L   Calcium 9.8 8.7 - 10.3 mg/dL   Total Protein 6.7 6.0 - 8.5 g/dL   Albumin 4.8 3.8 - 4.8 g/dL   Globulin, Total 1.9 1.5 - 4.5 g/dL   Albumin/Globulin Ratio 2.5 (H) 1.2 - 2.2   Bilirubin Total 0.5 0.0 - 1.2 mg/dL   Alkaline Phosphatase 67 39 - 117 IU/L   AST 17 0 - 40 IU/L   ALT 16 0 - 32 IU/L  CBC with Differential/Platelet  Result Value Ref Range   WBC CANCELED x10E3/uL  TSH  Result Value Ref Range   TSH 1.680 0.450 - 4.500 uIU/mL      Assessment & Plan:   Problem List Items Addressed This Visit      Cardiovascular and Mediastinum   Essential hypertension, benign    Chronic, stable with BP below goal on home readings.  Continue current medication regimen and obtain CMP at next visit.          Digestive   GERD (gastroesophageal reflux disease)    Chronic, stable at this time.  Obtain mag level yearly, will obtain outpatient this visit.  Continue current medication regimen and collaboration with GI team.        Relevant Orders   Magnesium     Endocrine   Hyperlipidemia associated with type 2 diabetes mellitus (HCC)     Chronic, ongoing.  Recent lipid panel with LDL <100.  Continue current medication regimen and adjust as needed.  Lipid panel next visit.      Uncontrolled type 2 diabetes mellitus (HCC) - Primary    Chronic, ongoing.  Suspect her A1C will be elevated >7 on labs due to elevation of morning BS, but will wait to obtain outpatient A1C and determine need for change in plan of care.  At this time continue Ozempic and Invokana.  Recommend continue to check BS daily and return to regular exercise and diet focus to maintain A1C <7.  Return in 3 months.      Relevant Orders   Bayer DCA Hb A1c Waived      I discussed the assessment and treatment plan with the patient. The patient was provided an opportunity to ask questions and all were answered. The patient agreed with the plan and demonstrated an understanding of the instructions.   The patient was advised to call back or seek an in-person evaluation if the symptoms worsen or if the condition fails to improve as anticipated.   I provided 15 minutes of time during this encounter.  Follow up plan: Return in about 3 months (around 11/08/2019) for T2DM, HTN/HLD.

## 2019-08-09 NOTE — Assessment & Plan Note (Signed)
Chronic, ongoing.  Suspect her A1C will be elevated >7 on labs due to elevation of morning BS, but will wait to obtain outpatient A1C and determine need for change in plan of care.  At this time continue Ozempic and Invokana.  Recommend continue to check BS daily and return to regular exercise and diet focus to maintain A1C <7.  Return in 3 months.

## 2019-08-09 NOTE — Assessment & Plan Note (Signed)
Chronic, ongoing.  Recent lipid panel with LDL <100.  Continue current medication regimen and adjust as needed.  Lipid panel next visit.

## 2019-08-09 NOTE — Assessment & Plan Note (Signed)
Chronic, stable with BP below goal on home readings.  Continue current medication regimen and obtain CMP at next visit.

## 2019-08-09 NOTE — Assessment & Plan Note (Signed)
Chronic, stable at this time.  Obtain mag level yearly, will obtain outpatient this visit.  Continue current medication regimen and collaboration with GI team.

## 2019-08-13 ENCOUNTER — Other Ambulatory Visit: Payer: Self-pay | Admitting: Nurse Practitioner

## 2019-08-16 ENCOUNTER — Other Ambulatory Visit: Payer: Self-pay | Admitting: Nurse Practitioner

## 2019-08-16 MED ORDER — MICONAZOLE NITRATE 2 % VA CREA: 1.0000 | TOPICAL_CREAM | Freq: Every day | VAGINAL | 0 refills | Status: DC

## 2019-09-13 ENCOUNTER — Ambulatory Visit: Payer: Self-pay | Admitting: Pharmacist

## 2019-09-13 ENCOUNTER — Telehealth: Payer: Self-pay

## 2019-09-13 NOTE — Chronic Care Management (AMB) (Signed)
  Chronic Care Management   Note  09/13/2019 Name: Christine Booth MRN: [id number] DOB: July 09, 1956  Christine Booth is a 63 y.o. year old female who is a primary care patient of Cannady, Barbaraann Faster, NP. The CCM team was consulted for assistance with chronic disease management and care coordination needs.    Attempted to contact patient to f/u on medication management needs. Left HIPAA compliant message for her to return my call at her convenience.   Follow up plan: - If I do not hear back, will outreach again in the next 4-6 weeks for continued medication management support  Catie Darnelle Maffucci, PharmD Clinical Pharmacist [place] [phone]

## 2019-09-25 ENCOUNTER — Other Ambulatory Visit: Payer: Self-pay | Admitting: Unknown Physician Specialty

## 2019-09-26 NOTE — Telephone Encounter (Signed)
Requested medication (s) are due for refill today: no  Requested medication (s) are on the active medication list: no  Last refill:  04/07/2019  Future visit scheduled: yes  Notes to clinic:  Medication was discontinued    Requested Prescriptions  Pending Prescriptions Disp Refills   ONGLYZA 5 MG TABS tablet [Pharmacy Med Name: ONGLYZA 5 MG TABLET] 30 tablet 0    Sig: TAKE 1 TABLET BY [place]     Endocrinology:  Diabetes - DPP-4 Inhibitors Passed - 09/25/2019  4:39 PM      Passed - HBA1C is between 0 and 7.9 and within 180 days    HB A1C (BAYER DCA - WAIVED)  Date Value Ref Range Status  05/04/2019 6.8 <7.0 % Final    Comment:                                          Diabetic Adult            <7.0                                       Healthy Adult        4.3 - 5.7                                                           (DCCT/NGSP) American Diabetes Association's Summary of Glycemic Recommendations for Adults with Diabetes: Hemoglobin A1c <7.0%. More stringent glycemic goals (A1c <6.0%) may further reduce complications at the cost of increased risk of hypoglycemia.          Passed - Cr in normal range and within 360 days    Creatinine, Ser  Date Value Ref Range Status  05/04/2019 0.75 0.57 - 1.00 mg/dL Final         Passed - Valid encounter within last 6 months    Recent Outpatient Visits          1 month ago Uncontrolled type 2 diabetes mellitus with hyperglycemia ([place])   [place], Barbaraann Faster, NP   4 months ago Annual physical exam   [place], Jolene T, NP   7 months ago Uncontrolled type 2 diabetes mellitus with hyperglycemia ([place])   [place], Jolene T, NP   10 months ago Uncontrolled type 2 diabetes mellitus with hyperglycemia ([place])   [place], [place] T, NP   1 year ago Uncontrolled type 2 diabetes mellitus with hyperglycemia ([place])   [place]  Trinna Post, PA-C      Future Appointments            In 1 month Cannady, Barbaraann Faster, NP [facility]

## 2019-10-18 ENCOUNTER — Telehealth: Payer: Self-pay

## 2019-10-18 ENCOUNTER — Other Ambulatory Visit: Payer: Self-pay | Admitting: Nurse Practitioner

## 2019-10-18 ENCOUNTER — Ambulatory Visit: Payer: Self-pay | Admitting: Pharmacist

## 2019-10-18 NOTE — Chronic Care Management (AMB) (Signed)
  Chronic Care Management   Note  10/18/2019 Name: Marcene Schiebel MRN: [id number] DOB: November 04, 1956  Lidiana Saffran is a 63 y.o. year old female who is a primary care patient of Cannady, Barbaraann Faster, NP. The CCM team was consulted for assistance with chronic disease management and care coordination needs.    Attempted to contact patient for medication management review. Left HIPAA compliant message for patient to return my call at her convenience.   Follow up plan: - Will follow up with patient over the next 6-8 weeks  Catie Darnelle Maffucci, PharmD Clinical Pharmacist [place] [phone]

## 2019-11-05 ENCOUNTER — Encounter: Payer: Self-pay | Admitting: Nurse Practitioner

## 2019-11-08 ENCOUNTER — Other Ambulatory Visit: Payer: Self-pay | Admitting: Nurse Practitioner

## 2019-11-09 ENCOUNTER — Other Ambulatory Visit: Payer: Self-pay

## 2019-11-09 ENCOUNTER — Encounter: Payer: Self-pay | Admitting: Nurse Practitioner

## 2019-11-09 ENCOUNTER — Ambulatory Visit (INDEPENDENT_AMBULATORY_CARE_PROVIDER_SITE_OTHER): Payer: Medicare Other | Admitting: Nurse Practitioner

## 2019-11-09 VITALS — BP 127/68 | HR 68 | Temp 98.0°F | Wt 162.0 lb

## 2019-11-09 DIAGNOSIS — E1169 Type 2 diabetes mellitus with other specified complication: Secondary | ICD-10-CM | POA: Diagnosis not present

## 2019-11-09 DIAGNOSIS — I10 Essential (primary) hypertension: Secondary | ICD-10-CM

## 2019-11-09 DIAGNOSIS — E1159 Type 2 diabetes mellitus with other circulatory complications: Secondary | ICD-10-CM

## 2019-11-09 DIAGNOSIS — E785 Hyperlipidemia, unspecified: Secondary | ICD-10-CM

## 2019-11-09 DIAGNOSIS — E1165 Type 2 diabetes mellitus with hyperglycemia: Secondary | ICD-10-CM

## 2019-11-09 DIAGNOSIS — I152 Hypertension secondary to endocrine disorders: Secondary | ICD-10-CM

## 2019-11-09 NOTE — Assessment & Plan Note (Signed)
Chronic, stable with BP below goal on home readings.  Continue current medication regimen and obtain CMP outpatient.

## 2019-11-09 NOTE — Patient Instructions (Signed)
Carbohydrate Counting for Diabetes Mellitus, Adult  Carbohydrate counting is a method of keeping track of how many carbohydrates you eat. Eating carbohydrates naturally increases the amount of sugar (glucose) in the blood. Counting how many carbohydrates you eat helps keep your blood glucose within normal limits, which helps you manage your diabetes (diabetes mellitus). It is important to know how many carbohydrates you can safely have in each meal. This is different for every person. A diet and nutrition specialist (registered dietitian) can help you make a meal plan and calculate how many carbohydrates you should have at each meal and snack. Carbohydrates are found in the following foods:  Grains, such as breads and cereals.  Dried beans and soy products.  Starchy vegetables, such as potatoes, peas, and corn.  Fruit and fruit juices.  Milk and yogurt.  Sweets and snack foods, such as cake, cookies, candy, chips, and soft drinks. How do I count carbohydrates? There are two ways to count carbohydrates in food. You can use either of the methods or a combination of both. Reading "Nutrition Facts" on packaged food The "Nutrition Facts" list is included on the labels of almost all packaged foods and beverages in the [place] It includes:  The serving size.  Information about nutrients in each serving, including the grams (g) of carbohydrate per serving. To use the "Nutrition Facts":  Decide how many servings you will have.  Multiply the number of servings by the number of carbohydrates per serving.  The resulting number is the total amount of carbohydrates that you will be having. Learning standard serving sizes of other foods When you eat carbohydrate foods that are not packaged or do not include "Nutrition Facts" on the label, you need to measure the servings in order to count the amount of carbohydrates:  Measure the foods that you will eat with a food scale or measuring cup, if needed.   Decide how many standard-size servings you will eat.  Multiply the number of servings by 15. Most carbohydrate-rich foods have about 15 g of carbohydrates per serving. ? For example, if you eat 8 oz (170 g) of strawberries, you will have eaten 2 servings and 30 g of carbohydrates (2 servings x 15 g = 30 g).  For foods that have more than one food mixed, such as soups and casseroles, you must count the carbohydrates in each food that is included. The following list contains standard serving sizes of common carbohydrate-rich foods. Each of these servings has about 15 g of carbohydrates:   hamburger bun or  English muffin.   oz (15 mL) syrup.   oz (14 g) jelly.  1 slice of bread.  1 six-inch tortilla.  3 oz (85 g) cooked rice or pasta.  4 oz (113 g) cooked dried beans.  4 oz (113 g) starchy vegetable, such as peas, corn, or potatoes.  4 oz (113 g) hot cereal.  4 oz (113 g) mashed potatoes or  of a large baked potato.  4 oz (113 g) canned or frozen fruit.  4 oz (120 mL) fruit juice.  4-6 crackers.  6 chicken nuggets.  6 oz (170 g) unsweetened dry cereal.  6 oz (170 g) plain fat-free yogurt or yogurt sweetened with artificial sweeteners.  8 oz (240 mL) milk.  8 oz (170 g) fresh fruit or one small piece of fruit.  24 oz (680 g) popped popcorn. Example of carbohydrate counting Sample meal  3 oz (85 g) chicken breast.  6 oz (170 g)   brown rice.  4 oz (113 g) corn.  8 oz (240 mL) milk.  8 oz (170 g) strawberries with sugar-free whipped topping. Carbohydrate calculation 1. Identify the foods that contain carbohydrates: ? Rice. ? Corn. ? Milk. ? Strawberries. 2. Calculate how many servings you have of each food: ? 2 servings rice. ? 1 serving corn. ? 1 serving milk. ? 1 serving strawberries. 3. Multiply each number of servings by 15 g: ? 2 servings rice x 15 g = 30 g. ? 1 serving corn x 15 g = 15 g. ? 1 serving milk x 15 g = 15 g. ? 1 serving  strawberries x 15 g = 15 g. 4. Add together all of the amounts to find the total grams of carbohydrates eaten: ? 30 g + 15 g + 15 g + 15 g = 75 g of carbohydrates total. Summary  Carbohydrate counting is a method of keeping track of how many carbohydrates you eat.  Eating carbohydrates naturally increases the amount of sugar (glucose) in the blood.  Counting how many carbohydrates you eat helps keep your blood glucose within normal limits, which helps you manage your diabetes.  A diet and nutrition specialist (registered dietitian) can help you make a meal plan and calculate how many carbohydrates you should have at each meal and snack. This information is not intended to replace advice given to you by your health care provider. Make sure you discuss any questions you have with your health care provider. Document Released: 11/24/2005 Document Revised: 06/18/2017 Document Reviewed: 05/07/2016 Elsevier Patient Education  2020 [employer]  

## 2019-11-09 NOTE — Assessment & Plan Note (Signed)
Chronic, ongoing.  Suspect her A1C will be elevated >7 on labs due to elevation of morning BS, but will wait to obtain outpatient A1C and determine need for change in plan of care.  At this time continue Ozempic and Invokana.  Recommend continue to check BS daily and return to regular exercise and diet focus to maintain A1C <7.  Return in 3 months.

## 2019-11-09 NOTE — Progress Notes (Signed)
BP 127/68   Pulse 68   Temp 98 F (36.7 C) (Oral)   Wt 162 lb (73.5 kg)   LMP  (LMP Unknown)   BMI 31.19 kg/m    Subjective:    Patient ID: Christine Booth, female    DOB: 10-14-1956, 63 y.o.   MRN: [id number]  HPI: Christine Booth is a 63 y.o. female  Chief Complaint  Patient presents with  . Diabetes    Patient states she has not had a recent eye exam  . Hyperlipidemia  . Hypertension    This visit was completed via Doximity due to the restrictions of the COVID-19 pandemic. All issues as above were discussed and addressed. Physical exam was done as above through visual confirmation on Doximity. If it was felt that the patient should be evaluated in the office, they were directed there. The patient verbally consented to this visit.  Location of the patient: home  Location of the provider: home  Those involved with this call:  ? Provider: Marnee Guarneri, DNP ? CMA: Yvonna Alanis, CMA ? Front Desk/Registration: Jill Side   Time spent on call: 15 minutes with patient face to face via video conference. More than 50% of this time was spent in counseling and coordination of care. 10 minutes total spent in review of patient's record and preparation of their chart.   I verified patient identity using two factors (patient name and date of birth). Patient consents verbally to being seen via telemedicine visit today.   DIABETES Continues on  Invokana and Ozempic, Onglyza discontinued in June due to duplicative mechanism of Ozempic and Onglyza.  Currently working along with CCM team. Last A1C improved with Ozempic increased to 1 MG in February, A1C May 6.8%, downward trend from 7.6%.  Missed lab visits since this time due to Covid.  Has not been exercising, has not returned to [facility].  Did take Metformin in the beginning, caused some constipation.  Took for awhile. Hypoglycemic episodes:no Polydipsia/polyuria: no Visual disturbance: no Chest pain: no  Paresthesias: no Glucose Monitoring: yes             Accucheck frequency: Daily             Fasting glucose: average 165 to 170 in the morning             Post prandial:             Evening:             Before meals: Taking Insulin?: no             Long acting insulin:             Short acting insulin: Blood Pressure Monitoring: daily Retinal Examination: Not up to Date Foot Exam: Up to Date Pneumovax: Up to Date Influenza: Up to Date Aspirin: yes   HYPERTENSION / HYPERLIPIDEMIA Continues on HCTZ, Losartan, and Pravastatin. Satisfied with current treatment? yes Duration of hypertension: chronic BP monitoring frequency: daily BP range: 110-120/70-80 range BP medication side effects: no Duration of hyperlipidemia: chronic Cholesterol medication side effects: no Cholesterol supplements: none Medication compliance: good compliance Aspirin: yes Recent stressors: no Recurrent headaches: no Visual changes: no Palpitations: no Dyspnea: no Chest pain: no Lower extremity edema: no Dizzy/lightheaded: no   Relevant past medical, surgical, family and social history reviewed and updated as indicated. Interim medical history since our last visit reviewed. Allergies and medications reviewed and updated.  Review of Systems  Constitutional: Negative  for activity change, appetite change, diaphoresis, fatigue and fever.  Respiratory: Negative for cough, chest tightness and shortness of breath.   Cardiovascular: Negative for chest pain, palpitations and leg swelling.  Gastrointestinal: Negative for abdominal distention, abdominal pain, constipation, diarrhea, nausea and vomiting.  Endocrine: Negative for cold intolerance, heat intolerance, polydipsia, polyphagia and polyuria.  Neurological: Negative for dizziness, syncope, weakness, light-headedness, numbness and headaches.  Psychiatric/Behavioral: Negative.     Per HPI unless specifically indicated above     Objective:    BP  127/68   Pulse 68   Temp 98 F (36.7 C) (Oral)   Wt 162 lb (73.5 kg)   LMP  (LMP Unknown)   BMI 31.19 kg/m   Wt Readings from Last 3 Encounters:  11/09/19 162 lb (73.5 kg)  08/09/19 166 lb 3.2 oz (75.4 kg)  05/04/19 171 lb (77.6 kg)    Physical Exam Vitals signs and nursing note reviewed.  Constitutional:      General: She is awake. She is not in acute distress.    Appearance: She is well-developed. She is not ill-appearing.  HENT:     Head: Normocephalic.     Right Ear: Hearing normal.     Left Ear: Hearing normal.  Eyes:     General: Lids are normal.        Right eye: No discharge.        Left eye: No discharge.     Conjunctiva/sclera: Conjunctivae normal.  Neck:     Musculoskeletal: Normal range of motion.  Pulmonary:     Effort: Pulmonary effort is normal. No accessory muscle usage or respiratory distress.  Neurological:     Mental Status: She is alert and oriented to person, place, and time.  Psychiatric:        Attention and Perception: Attention normal.        Mood and Affect: Mood normal.        Behavior: Behavior normal. Behavior is cooperative.        Thought Content: Thought content normal.        Judgment: Judgment normal.     Results for orders placed or performed in visit on 05/04/19  Bayer DCA Hb A1c Waived  Result Value Ref Range   HB A1C (BAYER DCA - WAIVED) 6.8 <7.0 %  Microalbumin, Urine Waived  Result Value Ref Range   Microalb, Ur Waived 10 0 - 19 mg/L   Creatinine, Urine Waived 50 10 - 300 mg/dL   Microalb/Creat Ratio 30-300 (H) <30 mg/g  Lipid Panel Piccolo, Waived  Result Value Ref Range   Cholesterol Piccolo, Waived 160 <200 mg/dL   HDL Chol Piccolo, Waived 50 (L) >59 mg/dL   Triglycerides Piccolo,Waived 108 <150 mg/dL   Chol/HDL Ratio Piccolo,Waive 3.2 mg/dL   LDL Chol Calc Piccolo Waived 88 <100 mg/dL   VLDL Chol Calc Piccolo,Waive 22 <30 mg/dL  Comprehensive metabolic panel  Result Value Ref Range   Glucose 144 (H) 65 - 99 mg/dL    BUN 13 8 - 27 mg/dL   Creatinine, Ser 0.75 0.57 - 1.00 mg/dL   GFR calc non Af Amer 85 >59 mL/min/1.73   GFR calc Af Amer 97 >59 mL/min/1.73   BUN/Creatinine Ratio 17 12 - 28   Sodium 142 134 - 144 mmol/L   Potassium 4.1 3.5 - 5.2 mmol/L   Chloride 102 96 - 106 mmol/L   CO2 23 20 - 29 mmol/L   Calcium 9.8 8.7 - 10.3 mg/dL   Total Protein  6.7 6.0 - 8.5 g/dL   Albumin 4.8 3.8 - 4.8 g/dL   Globulin, Total 1.9 1.5 - 4.5 g/dL   Albumin/Globulin Ratio 2.5 (H) 1.2 - 2.2   Bilirubin Total 0.5 0.0 - 1.2 mg/dL   Alkaline Phosphatase 67 39 - 117 IU/L   AST 17 0 - 40 IU/L   ALT 16 0 - 32 IU/L  CBC with Differential/Platelet  Result Value Ref Range   WBC CANCELED x10E3/uL  TSH  Result Value Ref Range   TSH 1.680 0.450 - 4.500 uIU/mL      Assessment & Plan:   Problem List Items Addressed This Visit      Cardiovascular and Mediastinum   Hypertension associated with diabetes ([place])    Chronic, stable with BP below goal on home readings.  Continue current medication regimen and obtain CMP outpatient.      Relevant Orders   Bayer DCA Hb A1c Waived     Endocrine   Hyperlipidemia associated with type 2 diabetes mellitus (HCC)    Chronic, ongoing.  Recent lipid panel with LDL <100.  Continue current medication regimen and adjust as needed.  Lipid panel outpatient.      Relevant Orders   Comprehensive metabolic panel   Lipid Panel w/o Chol/HDL Ratio out   Bayer DCA Hb A1c Waived   Uncontrolled type 2 diabetes mellitus ([place]) - Primary    Chronic, ongoing.  Suspect her A1C will be elevated >7 on labs due to elevation of morning BS, but will wait to obtain outpatient A1C and determine need for change in plan of care.  At this time continue Ozempic and Invokana.  Recommend continue to check BS daily and return to regular exercise and diet focus to maintain A1C <7.  Return in 3 months.      Relevant Orders   Bayer DCA Hb A1c Waived       Follow up plan: Return in about 3 months  (around 02/07/2020) for T2DM, HTN/HLD, Sleep.

## 2019-11-09 NOTE — Assessment & Plan Note (Signed)
Chronic, ongoing.  Recent lipid panel with LDL <100.  Continue current medication regimen and adjust as needed.  Lipid panel outpatient.

## 2019-11-13 ENCOUNTER — Other Ambulatory Visit: Payer: Self-pay | Admitting: Physician Assistant

## 2019-11-13 ENCOUNTER — Other Ambulatory Visit: Payer: Self-pay | Admitting: Unknown Physician Specialty

## 2019-11-13 ENCOUNTER — Other Ambulatory Visit: Payer: Self-pay | Admitting: Nurse Practitioner

## 2019-11-13 DIAGNOSIS — E1165 Type 2 diabetes mellitus with hyperglycemia: Secondary | ICD-10-CM

## 2019-11-14 NOTE — Telephone Encounter (Signed)
Requested Prescriptions  Pending Prescriptions Disp Refills  . mupirocin ointment (BACTROBAN) 2 % [Pharmacy Med Name: MUPIROCIN 2% OINTMENT] 22 g 0    Sig: PLACE 1 APPLICATION INTO THE NOSE 2 (TWO) TIMES DAILY.     Off-Protocol Failed - 11/13/2019  8:32 AM      Failed - Medication not assigned to a protocol, review manually.      Passed - Valid encounter within last 12 months    Recent Outpatient Visits          5 days ago Uncontrolled type 2 diabetes mellitus with hyperglycemia ([place])   [place], Jolene T, NP   3 months ago Uncontrolled type 2 diabetes mellitus with hyperglycemia ([place])   [place], Barbaraann Faster, NP   6 months ago Annual physical exam   [employer], Jolene T, NP   9 months ago Uncontrolled type 2 diabetes mellitus with hyperglycemia ([place])   [place], Jolene T, NP   1 year ago Uncontrolled type 2 diabetes mellitus with hyperglycemia ([place])   [place], Barbaraann Faster, NP      Future Appointments            In 3 months Cannady, Barbaraann Faster, NP [facility]           . triamcinolone (KENALOG) 0.025 % cream [Pharmacy Med Name: TRIAMCINOLONE 0.025% CREAM] 30 g 0    Sig: APPLY TO [place]     Dermatology:  Corticosteroids Passed - 11/13/2019  8:32 AM      Passed - Valid encounter within last 12 months    Recent Outpatient Visits          5 days ago Uncontrolled type 2 diabetes mellitus with hyperglycemia ([place])   [place], Jolene T, NP   3 months ago Uncontrolled type 2 diabetes mellitus with hyperglycemia ([place])   [place], Barbaraann Faster, NP   6 months ago Annual physical exam   [place], Jolene T, NP   9 months ago Uncontrolled type 2 diabetes mellitus with hyperglycemia ([place])   [place], Jolene T, NP   1 year ago Uncontrolled type 2  diabetes mellitus with hyperglycemia ([place])   [place], Barbaraann Faster, NP      Future Appointments            In 3 months Cannady, Barbaraann Faster, NP [facility]

## 2019-11-14 NOTE — Telephone Encounter (Signed)
Requested medication (s) are due for refill today: yes  Requested medication (s) are on the active medication list: yes  Last refill:  11/02/2019  Future visit scheduled:yes  Notes to clinic: no protocol    Requested Prescriptions  Pending Prescriptions Disp Refills   mupirocin ointment (BACTROBAN) 2 % [Pharmacy Med Name: MUPIROCIN 2% OINTMENT] 22 g 0    Sig: PLACE 1 APPLICATION INTO THE NOSE 2 (TWO) TIMES DAILY.     Off-Protocol Failed - 11/13/2019  8:32 AM      Failed - Medication not assigned to a protocol, review manually.      Passed - Valid encounter within last 12 months    Recent Outpatient Visits          5 days ago Uncontrolled type 2 diabetes mellitus with hyperglycemia ([place])   [place], Jolene T, NP   3 months ago Uncontrolled type 2 diabetes mellitus with hyperglycemia ([place])   [place], Barbaraann Faster, NP   6 months ago Annual physical exam   [employer], Jolene T, NP   9 months ago Uncontrolled type 2 diabetes mellitus with hyperglycemia ([place])   [place], Jolene T, NP   1 year ago Uncontrolled type 2 diabetes mellitus with hyperglycemia ([place])   [place], Barbaraann Faster, NP      Future Appointments            In 3 months Cannady, Ripley T, NP [facility]           Signed Prescriptions Disp Refills   triamcinolone (KENALOG) 0.025 % cream 30 g 0    Sig: APPLY TO AFFECTED AREA TWICE A DAY     Dermatology:  Corticosteroids Passed - 11/13/2019  8:32 AM      Passed - Valid encounter within last 12 months    Recent Outpatient Visits          5 days ago Uncontrolled type 2 diabetes mellitus with hyperglycemia ([place])   [place], Jolene T, NP   3 months ago Uncontrolled type 2 diabetes mellitus with hyperglycemia ([place])   [place] Cannady, Barbaraann Faster, NP   6 months ago Annual physical exam   [facility] [place], Jolene T, NP   9 months ago Uncontrolled type 2 diabetes mellitus with hyperglycemia ([place])   [place], Jolene T, NP   1 year ago Uncontrolled type 2 diabetes mellitus with hyperglycemia ([place])   [place], Barbaraann Faster, NP      Future Appointments            In 3 months Cannady, Barbaraann Faster, NP [facility]

## 2019-11-14 NOTE — Telephone Encounter (Signed)
Routing to provider  

## 2019-11-17 ENCOUNTER — Other Ambulatory Visit: Payer: Self-pay

## 2019-11-17 ENCOUNTER — Other Ambulatory Visit: Payer: Medicare Other

## 2019-11-17 ENCOUNTER — Other Ambulatory Visit: Payer: Self-pay | Admitting: Nurse Practitioner

## 2019-11-17 DIAGNOSIS — I152 Hypertension secondary to endocrine disorders: Secondary | ICD-10-CM

## 2019-11-17 DIAGNOSIS — I10 Essential (primary) hypertension: Secondary | ICD-10-CM

## 2019-11-17 DIAGNOSIS — E1169 Type 2 diabetes mellitus with other specified complication: Secondary | ICD-10-CM

## 2019-11-17 DIAGNOSIS — E1159 Type 2 diabetes mellitus with other circulatory complications: Secondary | ICD-10-CM

## 2019-11-17 DIAGNOSIS — E785 Hyperlipidemia, unspecified: Secondary | ICD-10-CM

## 2019-11-17 DIAGNOSIS — Z1211 Encounter for screening for malignant neoplasm of colon: Secondary | ICD-10-CM

## 2019-11-17 DIAGNOSIS — E1165 Type 2 diabetes mellitus with hyperglycemia: Secondary | ICD-10-CM

## 2019-11-17 DIAGNOSIS — K219 Gastro-esophageal reflux disease without esophagitis: Secondary | ICD-10-CM

## 2019-11-17 LAB — BAYER DCA HB A1C WAIVED: HB A1C (BAYER DCA - WAIVED): 7.1 % — ABNORMAL HIGH (ref <7.0)

## 2019-11-18 LAB — COMPREHENSIVE METABOLIC PANEL
ALT: 17 IU/L (ref 0–32)
AST: 18 IU/L (ref 0–40)
Albumin/Globulin Ratio: 2.6 — ABNORMAL HIGH (ref 1.2–2.2)
Albumin: 4.5 g/dL (ref 3.8–4.8)
Alkaline Phosphatase: 65 IU/L (ref 39–117)
BUN/Creatinine Ratio: 14 (ref 12–28)
BUN: 10 mg/dL (ref 8–27)
Bilirubin Total: 0.5 mg/dL (ref 0.0–1.2)
CO2: 24 mmol/L (ref 20–29)
Calcium: 9.5 mg/dL (ref 8.7–10.3)
Chloride: 102 mmol/L (ref 96–106)
Creatinine, Ser: 0.7 mg/dL (ref 0.57–1.00)
GFR calc Af Amer: 106 mL/min/{1.73_m2} (ref >59)
GFR calc non Af Amer: 92 mL/min/{1.73_m2} (ref >59)
Globulin, Total: 1.7 g/dL (ref 1.5–4.5)
Glucose: 142 mg/dL — ABNORMAL HIGH (ref 65–99)
Potassium: 3.2 mmol/L — ABNORMAL LOW (ref 3.5–5.2)
Sodium: 142 mmol/L (ref 134–144)
Total Protein: 6.2 g/dL (ref 6.0–8.5)

## 2019-11-18 LAB — LIPID PANEL W/O CHOL/HDL RATIO
Cholesterol, Total: 149 mg/dL (ref 100–199)
HDL: 46 mg/dL (ref >39)
LDL Chol Calc (NIH): 80 mg/dL (ref 0–99)
Triglycerides: 133 mg/dL (ref 0–149)
VLDL Cholesterol Cal: 23 mg/dL (ref 5–40)

## 2019-11-18 LAB — MAGNESIUM: Magnesium: 2.3 mg/dL (ref 1.6–2.3)

## 2019-11-29 ENCOUNTER — Other Ambulatory Visit: Payer: Self-pay | Admitting: Nurse Practitioner

## 2019-12-06 ENCOUNTER — Other Ambulatory Visit: Payer: Self-pay | Admitting: Nurse Practitioner

## 2019-12-06 DIAGNOSIS — E1165 Type 2 diabetes mellitus with hyperglycemia: Secondary | ICD-10-CM

## 2019-12-06 DIAGNOSIS — I10 Essential (primary) hypertension: Secondary | ICD-10-CM

## 2019-12-14 ENCOUNTER — Telehealth: Payer: Self-pay

## 2019-12-14 ENCOUNTER — Ambulatory Visit: Payer: Self-pay | Admitting: Pharmacist

## 2019-12-14 NOTE — Chronic Care Management (AMB) (Signed)
  Chronic Care Management   Note  12/14/2019 Name: Christine Booth MRN: [id number] DOB: 1956/07/22  [name] is a 64 y.o. year old female who is a primary care patient of Cannady, Barbaraann Faster, NP. The CCM team was consulted for assistance with chronic disease management and care coordination needs.    Attempted to contact patient to discuss medication management needs. Left HIPAA compliant message for patient to return my call if she is still interested in working together. This was the third consecutive unsuccessful outreach attempt.   Plan: - CCM team would be happy to re-engage with patient in the future, if she expresses interested or with a new referral from PCP  Catie Darnelle Maffucci, PharmD, [place] [phone]

## 2019-12-16 ENCOUNTER — Encounter: Payer: Self-pay | Admitting: *Deleted

## 2019-12-23 ENCOUNTER — Other Ambulatory Visit: Payer: Self-pay | Admitting: Nurse Practitioner

## 2019-12-23 MED ORDER — EMPAGLIFLOZIN 25 MG PO TABS: 25.0000 mg | ORAL_TABLET | Freq: Every day | ORAL | 3 refills | Status: DC

## 2020-02-14 DIAGNOSIS — E119 Type 2 diabetes mellitus without complications: Secondary | ICD-10-CM | POA: Diagnosis not present

## 2020-02-16 ENCOUNTER — Other Ambulatory Visit: Payer: Self-pay | Admitting: Nurse Practitioner

## 2020-02-20 ENCOUNTER — Other Ambulatory Visit: Payer: Self-pay | Admitting: Nurse Practitioner

## 2020-02-27 ENCOUNTER — Encounter: Payer: Self-pay | Admitting: Nurse Practitioner

## 2020-02-27 ENCOUNTER — Other Ambulatory Visit: Payer: Self-pay

## 2020-02-27 ENCOUNTER — Ambulatory Visit (INDEPENDENT_AMBULATORY_CARE_PROVIDER_SITE_OTHER): Payer: Medicare Other | Admitting: Nurse Practitioner

## 2020-02-27 VITALS — BP 110/70 | HR 81 | Temp 99.0°F

## 2020-02-27 DIAGNOSIS — E1169 Type 2 diabetes mellitus with other specified complication: Secondary | ICD-10-CM | POA: Diagnosis not present

## 2020-02-27 DIAGNOSIS — D123 Benign neoplasm of transverse colon: Secondary | ICD-10-CM

## 2020-02-27 DIAGNOSIS — I152 Hypertension secondary to endocrine disorders: Secondary | ICD-10-CM

## 2020-02-27 DIAGNOSIS — E785 Hyperlipidemia, unspecified: Secondary | ICD-10-CM

## 2020-02-27 DIAGNOSIS — Z6832 Body mass index (BMI) 32.0-32.9, adult: Secondary | ICD-10-CM

## 2020-02-27 DIAGNOSIS — E1165 Type 2 diabetes mellitus with hyperglycemia: Secondary | ICD-10-CM

## 2020-02-27 DIAGNOSIS — E1159 Type 2 diabetes mellitus with other circulatory complications: Secondary | ICD-10-CM

## 2020-02-27 DIAGNOSIS — E6609 Other obesity due to excess calories: Secondary | ICD-10-CM | POA: Diagnosis not present

## 2020-02-27 DIAGNOSIS — I10 Essential (primary) hypertension: Secondary | ICD-10-CM

## 2020-02-27 LAB — BAYER DCA HB A1C WAIVED: HB A1C (BAYER DCA - WAIVED): 7.2 % — ABNORMAL HIGH (ref <7.0)

## 2020-02-27 LAB — MICROALBUMIN, URINE WAIVED
Creatinine, Urine Waived: 50 mg/dL (ref 10–300)
Microalb, Ur Waived: 10 mg/L (ref 0–19)
Microalb/Creat Ratio: <30 mg/g (ref <30)

## 2020-02-27 NOTE — Assessment & Plan Note (Signed)
Chronic, stable with BP below goal on home readings and in office today.  Continue current medication regimen and adjust as needed.  BMP today.  Continue to monitor BP at home regularly.

## 2020-02-27 NOTE — Assessment & Plan Note (Signed)
Chronic, ongoing.  Recent lipid panel with LDL <100.  Continue current medication regimen and adjust as needed.  Lipid panel today.

## 2020-02-27 NOTE — Assessment & Plan Note (Signed)
Chronic, ongoing.  A1C today 7.2% and urine ALB 10 with A:C <30.  At this time continue Ozempic and Vania Rea, will [facility] about costs.  May benefit from change to Trulicity if covered and increase dose.  Recommend continue to check BS daily and return to regular exercise and diet focus to maintain A1C <7.  Return in 3 months, unless changes made then see sooner.

## 2020-02-27 NOTE — Assessment & Plan Note (Signed)
Recommend continued focus on healthy diet choices and regular physical activity (30 minutes 5 days a week).  

## 2020-02-27 NOTE — Assessment & Plan Note (Signed)
Highly recommend she return for colonoscopy as was recommended by GI.  Discussed at length with her fears she has.  She wishes to think about this and will address at physical and continue to recommend.

## 2020-02-27 NOTE — Progress Notes (Signed)
BP 110/70   Pulse 81   Temp 99 F (37.2 C) (Oral)   LMP  (LMP Unknown)   SpO2 98%    Subjective:    Patient ID: Christine Booth, female    DOB: 12-21-1955, 64 y.o.   MRN: [id number]  HPI: Christine Booth is a 64 y.o. female  Chief Complaint  Patient presents with  . Diabetes    pt states she had recent eye exam at [facility] in [place]. Will call to get report.   . Hyperlipidemia  . Hypertension    DIABETES Continues on Jardiance and Ozempic, reports he Ozempic is becoming costly, almost $400 every 3 months.  Last visit A1C 7.1%.  Has not been exercising, has not returned to [employer].  Has been going for walks.  Did take Metformin in the beginning, caused some constipation.  Does have underlying issues with constipation, has had issues for several years.  Takes Senna-Docusate (takes this as needed), Miralax (has not taken in a long time).  Last colonoscopy was in 2018, she was to repeat in one year but did not attend this.  Reports fears about this, which PCP discussed at length with her importance of repeat screening to ensure no cancerous polyps present.  Educated her on colon CA.  She is due to annual physical in 3 months. Hypoglycemic episodes:no Polydipsia/polyuria: no Visual disturbance: no Chest pain: no Paresthesias: no Glucose Monitoring: yes             Accucheck frequency: once every couple weeks             Fasting glucose: 130's ran             Post prandial:             Evening:             Before meals: Taking Insulin?: no             Long acting insulin:             Short acting insulin: Blood Pressure Monitoring: daily Retinal Examination: Not up to Date Foot Exam: Up to Date Pneumovax: Up to Date Influenza: Up to Date Aspirin: yes   HYPERTENSION / HYPERLIPIDEMIA Continues on HCTZ, Losartan, and Pravastatin. Satisfied with current treatment? yes Duration of hypertension: chronic BP monitoring frequency: once every two weeks BP range:  110-120/70-80 range BP medication side effects: no Duration of hyperlipidemia: chronic Cholesterol medication side effects: no Cholesterol supplements: none Medication compliance: good compliance Aspirin: yes Recent stressors: no Recurrent headaches: no Visual changes: no Palpitations: no Dyspnea: no Chest pain: no Lower extremity edema: no Dizzy/lightheaded: no   Relevant past medical, surgical, family and social history reviewed and updated as indicated. Interim medical history since our last visit reviewed. Allergies and medications reviewed and updated.  Review of Systems  Constitutional: Negative for activity change, appetite change, diaphoresis, fatigue and fever.  Respiratory: Negative for cough, chest tightness and shortness of breath.   Cardiovascular: Negative for chest pain, palpitations and leg swelling.  Gastrointestinal: Negative for abdominal distention, abdominal pain, constipation, diarrhea, nausea and vomiting.  Endocrine: Negative for cold intolerance, heat intolerance, polydipsia, polyphagia and polyuria.  Neurological: Negative for dizziness, syncope, weakness, light-headedness, numbness and headaches.  Psychiatric/Behavioral: Negative.     Per HPI unless specifically indicated above     Objective:    BP 110/70   Pulse 81   Temp 99 F (37.2 C) (Oral)   LMP  (LMP Unknown)  SpO2 98%   Wt Readings from Last 3 Encounters:  11/09/19 162 lb (73.5 kg)  08/09/19 166 lb 3.2 oz (75.4 kg)  05/04/19 171 lb (77.6 kg)    Physical Exam Vitals and nursing note reviewed.  Constitutional:      General: She is awake. She is not in acute distress.    Appearance: She is well-developed, well-groomed and overweight. She is not ill-appearing.  HENT:     Head: Normocephalic.     Right Ear: Hearing normal.     Left Ear: Hearing normal.  Eyes:     General: Lids are normal.        Right eye: No discharge.        Left eye: No discharge.     Conjunctiva/sclera:  Conjunctivae normal.     Pupils: Pupils are equal, round, and reactive to light.  Neck:     Thyroid: No thyromegaly.     Vascular: No carotid bruit.  Cardiovascular:     Rate and Rhythm: Normal rate and regular rhythm.     Heart sounds: Normal heart sounds. No murmur. No gallop.   Pulmonary:     Effort: Pulmonary effort is normal. No accessory muscle usage or respiratory distress.     Breath sounds: Normal breath sounds.  Abdominal:     General: Bowel sounds are normal.     Palpations: Abdomen is soft.  Musculoskeletal:     Cervical back: Normal range of motion and neck supple.     Right lower leg: No edema.     Left lower leg: No edema.  Skin:    General: Skin is warm and dry.  Neurological:     Mental Status: She is alert and oriented to person, place, and time.  Psychiatric:        Attention and Perception: Attention normal.        Mood and Affect: Mood normal.        Behavior: Behavior normal. Behavior is cooperative.        Thought Content: Thought content normal.        Judgment: Judgment normal.    Diabetic Foot Exam - Simple   Simple Foot Form Visual Inspection No deformities, no ulcerations, no other skin breakdown bilaterally: Yes Sensation Testing Intact to touch and monofilament testing bilaterally: Yes Pulse Check Posterior Tibialis and Dorsalis pulse intact bilaterally: Yes Comments    Results for orders placed or performed in visit on 11/17/19  Bayer DCA Hb A1c Waived  Result Value Ref Range   HB A1C (BAYER DCA - WAIVED) 7.1 (H) <7.0 %  Lipid Panel w/o Chol/HDL Ratio out  Result Value Ref Range   Cholesterol, Total 149 100 - 199 mg/dL   Triglycerides 133 0 - 149 mg/dL   HDL 46 >39 mg/dL   VLDL Cholesterol Cal 23 5 - 40 mg/dL   LDL Chol Calc (NIH) 80 0 - 99 mg/dL  Comprehensive metabolic panel  Result Value Ref Range   Glucose 142 (H) 65 - 99 mg/dL   BUN 10 8 - 27 mg/dL   Creatinine, Ser 0.70 0.57 - 1.00 mg/dL   GFR calc non Af Amer 92 >59  mL/min/1.73   GFR calc Af Amer 106 >59 mL/min/1.73   BUN/Creatinine Ratio 14 12 - 28   Sodium 142 134 - 144 mmol/L   Potassium 3.2 (L) 3.5 - 5.2 mmol/L   Chloride 102 96 - 106 mmol/L   CO2 24 20 - 29 mmol/L   Calcium 9.5  8.7 - 10.3 mg/dL   Total Protein 6.2 6.0 - 8.5 g/dL   Albumin 4.5 3.8 - 4.8 g/dL   Globulin, Total 1.7 1.5 - 4.5 g/dL   Albumin/Globulin Ratio 2.6 (H) 1.2 - 2.2   Bilirubin Total 0.5 0.0 - 1.2 mg/dL   Alkaline Phosphatase 65 39 - 117 IU/L   AST 18 0 - 40 IU/L   ALT 17 0 - 32 IU/L  Magnesium  Result Value Ref Range   Magnesium 2.3 1.6 - 2.3 mg/dL      Assessment & Plan:   Problem List Items Addressed This Visit      Cardiovascular and Mediastinum   Hypertension associated with diabetes ([place])    Chronic, stable with BP below goal on home readings and in office today.  Continue current medication regimen and adjust as needed.  BMP today.  Continue to monitor BP at home regularly.      Relevant Orders   Bayer DCA Hb A1c Waived   Basic metabolic panel     Digestive   Benign neoplasm of transverse colon    Highly recommend she return for colonoscopy as was recommended by GI.  Discussed at length with her fears she has.  She wishes to think about this and will address at physical and continue to recommend.        Endocrine   Hyperlipidemia associated with type 2 diabetes mellitus (HCC)    Chronic, ongoing.  Recent lipid panel with LDL <100.  Continue current medication regimen and adjust as needed.  Lipid panel today.      Relevant Orders   Bayer DCA Hb A1c Waived   Lipid Panel w/o Chol/HDL Ratio   Uncontrolled type 2 diabetes mellitus ([place]) - Primary    Chronic, ongoing.  A1C today 7.2% and urine ALB 10 with A:C <30.  At this time continue Ozempic and Vania Rea, will reach out to [facility] about costs.  May benefit from change to Trulicity if covered and increase dose.  Recommend continue to check BS daily and return to regular exercise and diet focus to  maintain A1C <7.  Return in 3 months, unless changes made then see sooner.      Relevant Orders   Bayer DCA Hb A1c Waived   Microalbumin, Urine Waived     Other   Obesity    Recommend continued focus on healthy diet choices and regular physical activity (30 minutes 5 days a week).           Follow up plan: Return in about 3 months (around 05/29/2020) for Annual physical.

## 2020-02-27 NOTE — Patient Instructions (Signed)
Carbohydrate Counting for Diabetes Mellitus, Adult  Carbohydrate counting is a method of keeping track of how many carbohydrates you eat. Eating carbohydrates naturally increases the amount of sugar (glucose) in the blood. Counting how many carbohydrates you eat helps keep your blood glucose within normal limits, which helps you manage your diabetes (diabetes mellitus). It is important to know how many carbohydrates you can safely have in each meal. This is different for every person. A diet and nutrition specialist (registered dietitian) can help you make a meal plan and calculate how many carbohydrates you should have at each meal and snack. Carbohydrates are found in the following foods:  Grains, such as breads and cereals.  Dried beans and soy products.  Starchy vegetables, such as potatoes, peas, and corn.  Fruit and fruit juices.  Milk and yogurt.  Sweets and snack foods, such as cake, cookies, candy, chips, and soft drinks. How do I count carbohydrates? There are two ways to count carbohydrates in food. You can use either of the methods or a combination of both. Reading "Nutrition Facts" on packaged food The "Nutrition Facts" list is included on the labels of almost all packaged foods and beverages in the [place] It includes:  The serving size.  Information about nutrients in each serving, including the grams (g) of carbohydrate per serving. To use the "Nutrition Facts":  Decide how many servings you will have.  Multiply the number of servings by the number of carbohydrates per serving.  The resulting number is the total amount of carbohydrates that you will be having. Learning standard serving sizes of other foods When you eat carbohydrate foods that are not packaged or do not include "Nutrition Facts" on the label, you need to measure the servings in order to count the amount of carbohydrates:  Measure the foods that you will eat with a food scale or measuring cup, if  needed.  Decide how many standard-size servings you will eat.  Multiply the number of servings by 15. Most carbohydrate-rich foods have about 15 g of carbohydrates per serving. ? For example, if you eat 8 oz (170 g) of strawberries, you will have eaten 2 servings and 30 g of carbohydrates (2 servings x 15 g = 30 g).  For foods that have more than one food mixed, such as soups and casseroles, you must count the carbohydrates in each food that is included. The following list contains standard serving sizes of common carbohydrate-rich foods. Each of these servings has about 15 g of carbohydrates:   hamburger bun or  English muffin.   oz (15 mL) syrup.   oz (14 g) jelly.  1 slice of bread.  1 six-inch tortilla.  3 oz (85 g) cooked rice or pasta.  4 oz (113 g) cooked dried beans.  4 oz (113 g) starchy vegetable, such as peas, corn, or potatoes.  4 oz (113 g) hot cereal.  4 oz (113 g) mashed potatoes or  of a large baked potato.  4 oz (113 g) canned or frozen fruit.  4 oz (120 mL) fruit juice.  4-6 crackers.  6 chicken nuggets.  6 oz (170 g) unsweetened dry cereal.  6 oz (170 g) plain fat-free yogurt or yogurt sweetened with artificial sweeteners.  8 oz (240 mL) milk.  8 oz (170 g) fresh fruit or one small piece of fruit.  24 oz (680 g) popped popcorn. Example of carbohydrate counting Sample meal  3 oz (85 g) chicken breast.  6 oz (170 g)   brown rice.  4 oz (113 g) corn.  8 oz (240 mL) milk.  8 oz (170 g) strawberries with sugar-free whipped topping. Carbohydrate calculation 1. Identify the foods that contain carbohydrates: ? Rice. ? Corn. ? Milk. ? Strawberries. 2. Calculate how many servings you have of each food: ? 2 servings rice. ? 1 serving corn. ? 1 serving milk. ? 1 serving strawberries. 3. Multiply each number of servings by 15 g: ? 2 servings rice x 15 g = 30 g. ? 1 serving corn x 15 g = 15 g. ? 1 serving milk x 15 g = 15 g. ? 1  serving strawberries x 15 g = 15 g. 4. Add together all of the amounts to find the total grams of carbohydrates eaten: ? 30 g + 15 g + 15 g + 15 g = 75 g of carbohydrates total. Summary  Carbohydrate counting is a method of keeping track of how many carbohydrates you eat.  Eating carbohydrates naturally increases the amount of sugar (glucose) in the blood.  Counting how many carbohydrates you eat helps keep your blood glucose within normal limits, which helps you manage your diabetes.  A diet and nutrition specialist (registered dietitian) can help you make a meal plan and calculate how many carbohydrates you should have at each meal and snack. This information is not intended to replace advice given to you by your health care provider. Make sure you discuss any questions you have with your health care provider. Document Revised: 06/18/2017 Document Reviewed: 05/07/2016 Elsevier Patient Education  2020 Elsevier Inc.  

## 2020-02-28 ENCOUNTER — Other Ambulatory Visit: Payer: Self-pay | Admitting: Nurse Practitioner

## 2020-02-28 DIAGNOSIS — E1165 Type 2 diabetes mellitus with hyperglycemia: Secondary | ICD-10-CM

## 2020-02-28 DIAGNOSIS — E876 Hypokalemia: Secondary | ICD-10-CM

## 2020-02-28 DIAGNOSIS — I10 Essential (primary) hypertension: Secondary | ICD-10-CM

## 2020-02-28 LAB — BASIC METABOLIC PANEL
BUN/Creatinine Ratio: 14 (ref 12–28)
BUN: 10 mg/dL (ref 8–27)
CO2: 24 mmol/L (ref 20–29)
Calcium: 9.8 mg/dL (ref 8.7–10.3)
Chloride: 102 mmol/L (ref 96–106)
Creatinine, Ser: 0.73 mg/dL (ref 0.57–1.00)
GFR calc Af Amer: 100 mL/min/{1.73_m2} (ref >59)
GFR calc non Af Amer: 87 mL/min/{1.73_m2} (ref >59)
Glucose: 151 mg/dL — ABNORMAL HIGH (ref 65–99)
Potassium: 3.4 mmol/L — ABNORMAL LOW (ref 3.5–5.2)
Sodium: 141 mmol/L (ref 134–144)

## 2020-02-28 LAB — LIPID PANEL W/O CHOL/HDL RATIO
Cholesterol, Total: 151 mg/dL (ref 100–199)
HDL: 44 mg/dL (ref >39)
LDL Chol Calc (NIH): 77 mg/dL (ref 0–99)
Triglycerides: 179 mg/dL — ABNORMAL HIGH (ref 0–149)
VLDL Cholesterol Cal: 30 mg/dL (ref 5–40)

## 2020-02-28 NOTE — Telephone Encounter (Signed)
Requested Prescriptions  Pending Prescriptions Disp Refills  . losartan (COZAAR) 100 MG tablet [Pharmacy Med Name: LOSARTAN POTASSIUM 100 MG TAB] 90 tablet 0    Sig: TAKE 1 TABLET BY MOUTH EVERY DAY     Cardiovascular:  Angiotensin Receptor Blockers Failed - 02/28/2020  1:24 AM      Failed - K in normal range and within 180 days    Potassium  Date Value Ref Range Status  11/17/2019 3.2 (L) 3.5 - 5.2 mmol/L Final         Passed - Cr in normal range and within 180 days    Creatinine, Ser  Date Value Ref Range Status  11/17/2019 0.70 0.57 - 1.00 mg/dL Final         Passed - Patient is not pregnant      Passed - Last BP in normal range    BP Readings from Last 1 Encounters:  02/27/20 110/70         Passed - Valid encounter within last 6 months    Recent Outpatient Visits          Yesterday Uncontrolled type 2 diabetes mellitus with hyperglycemia ([place])   [place], Jolene T, NP   3 months ago Uncontrolled type 2 diabetes mellitus with hyperglycemia ([place])   [place], Jolene T, NP   6 months ago Uncontrolled type 2 diabetes mellitus with hyperglycemia ([place])   [place] Cannady, Jolene T, NP   10 months ago Annual physical exam   [place], Jolene T, NP   1 year ago Uncontrolled type 2 diabetes mellitus with hyperglycemia ([place])   [place], Henrine Screws T, NP      Future Appointments            In 3 months Cannady, Barbaraann Faster, NP [facility]           . hydrochlorothiazide (HYDRODIURIL) 12.5 MG tablet [Pharmacy Med Name: HYDROCHLOROTHIAZIDE 12.5 MG TB] 90 tablet 0    Sig: TAKE 1 TABLET BY MOUTH EVERY DAY     Cardiovascular: Diuretics - Thiazide Failed - 02/28/2020  1:24 AM      Failed - K in normal range and within 360 days    Potassium  Date Value Ref Range Status  11/17/2019 3.2 (L) 3.5 - 5.2 mmol/L Final         Passed - Ca in normal range and  within 360 days    Calcium  Date Value Ref Range Status  11/17/2019 9.5 8.7 - 10.3 mg/dL Final         Passed - Cr in normal range and within 360 days    Creatinine, Ser  Date Value Ref Range Status  11/17/2019 0.70 0.57 - 1.00 mg/dL Final         Passed - Na in normal range and within 360 days    Sodium  Date Value Ref Range Status  11/17/2019 142 134 - 144 mmol/L Final         Passed - Last BP in normal range    BP Readings from Last 1 Encounters:  02/27/20 110/70         Passed - Valid encounter within last 6 months    Recent Outpatient Visits          Yesterday Uncontrolled type 2 diabetes mellitus with hyperglycemia ([place])   [place], Jolene T, NP   3 months ago Uncontrolled type 2  diabetes mellitus with hyperglycemia ([place])   [place], Matteson T, NP   6 months ago Uncontrolled type 2 diabetes mellitus with hyperglycemia ([place])   [place] Cannady, Barbaraann Faster, NP   10 months ago Annual physical exam   [place], Pendleton T, NP   1 year ago Uncontrolled type 2 diabetes mellitus with hyperglycemia ([place])   [place], Barbaraann Faster, NP      Future Appointments            In 3 months [name], NP [facility]           Patient was seen in office on 02/27/2020. Labs pending.

## 2020-02-28 NOTE — Progress Notes (Signed)
Contacted via MyChart

## 2020-02-28 NOTE — Progress Notes (Signed)
Potassium recheck. 

## 2020-03-02 ENCOUNTER — Ambulatory Visit (INDEPENDENT_AMBULATORY_CARE_PROVIDER_SITE_OTHER): Payer: Medicare Other | Admitting: Pharmacist

## 2020-03-02 DIAGNOSIS — E1165 Type 2 diabetes mellitus with hyperglycemia: Secondary | ICD-10-CM | POA: Diagnosis not present

## 2020-03-02 NOTE — Chronic Care Management (AMB) (Signed)
Chronic Care Management   Follow Up Note   03/02/2020 Name: Christine Booth MRN: [id number] DOB: [birth year]  Referred by: [name], NP Reason for referral : Chronic Care Management (Medication Management)   Christine Booth is a 64 y.o. year old female who is a primary care patient of Cannady, Barbaraann Faster, NP. The CCM team was consulted for assistance with chronic disease management and care coordination needs.    Contacted patient for medication management review.  Review of patient status, including review of consultants reports, relevant laboratory and other test results, and collaboration with appropriate care team members and the patient's provider was performed as part of comprehensive patient evaluation and provision of chronic care management services.    SDOH (Social Determinants of Health) assessments performed: Yes See Care Plan activities for detailed interventions related to [facility])     Outpatient Encounter Medications as of 03/02/2020  Medication Sig  . empagliflozin (JARDIANCE) 25 MG TABS tablet Take 25 mg by mouth daily.  Marland Kitchen OZEMPIC, 1 MG/DOSE, 2 MG/1.5ML SOPN INJECT 1 MG INTO THE SKIN ONCE A WEEK.  Marland Kitchen aspirin EC 81 MG tablet Take 81 mg by mouth daily.  . fluticasone (FLONASE) 50 MCG/ACT nasal spray SPRAY 2 SPRAYS INTO EACH NOSTRIL EVERY DAY  . glucose blood test strip Use as instructed  . hydrochlorothiazide (HYDRODIURIL) 12.5 MG tablet TAKE 1 TABLET BY MOUTH EVERY DAY  . losartan (COZAAR) 100 MG tablet TAKE 1 TABLET BY MOUTH EVERY DAY  . miconazole (MICONAZOLE 7) 2 % vaginal cream Place 1 Applicatorful vaginally at bedtime.  . mupirocin ointment (BACTROBAN) 2 % PLACE 1 APPLICATION INTO THE NOSE 2 (TWO) TIMES DAILY.  . pantoprazole (PROTONIX) 40 MG tablet TAKE 1 TABLET BY MOUTH EVERY DAY  . polyethylene glycol (MIRALAX / GLYCOLAX) 17 g packet Take 17 g by mouth daily as needed.  . pravastatin (PRAVACHOL) 40 MG tablet TAKE 1 TABLET BY MOUTH EVERYDAY AT  BEDTIME  . sennosides-docusate sodium (SENOKOT-S) 8.6-50 MG tablet Take 1 tablet by mouth daily as needed for constipation.  . triamcinolone (KENALOG) 0.025 % cream APPLY TO AFFECTED AREA TWICE A DAY   No facility-administered encounter medications on file as of 03/02/2020.     Objective:   Goals Addressed            This Visit's Progress     Patient Stated   . "I want to work on my diabetes" (pt-stated)       CARE PLAN ENTRY (see longtitudinal plan of care for additional care plan information)  Current Barriers:  . Diabetes: uncontrolled; complicated by chronic medical conditions including HTN, HLD, most recent A1c 7.2% o Recently started on Medicare. Notes that her plan covers Jardiance, not Invokana, so she was switched. However, copays are now significantly expensive . Most recent eGFR: ~87 . Current antihyperglycemic regimen: Ozempic 1 mg weekly, Jardiance 25 mg daily . Cardiovascular risk reduction: o Current hypertensive regimen: HCTZ 12.5 mg daily, losartan 100 mg daily, o Current hyperlipidemia regimen: pravastatin 40 mg daily; LDL 77  o Current antiplatelet regimen: ASA 81 mg   Pharmacist Clinical Goal(s):  Marland Kitchen Over the next 90 days, patient will work with PharmD and primary care provider to address optimized medication management  Interventions: . Comprehensive medication review performed, medication list updated in electronic medical record . Reviewed income. Patient should qualify for Ozempic and Jardiance assistance through the respective drug companies. Will collaborate w/ CPhT to mail patient her portion of application. Patient aware that  we will need proof of income; she will provide a copy of her 2020 Tax Return. I will collaborate w/ PCP for completion of provider portion. Once all parts are received, will collaborate w/ CPhT for submission and f/u.   Patient Self Care Activities:  . Patient will check blood glucose daily, document, and provide at future  appointments . Patient will take medications as prescribed . Patient will report any questions or concerns to provider   Initial goal documentation         Plan:  - Will collaborate w/ patient, provider, and CPhT as above - Scheduled f/u call 04/13/20  Catie Darnelle Maffucci, PharmD, [place] [phone]

## 2020-03-02 NOTE — Patient Instructions (Signed)
Visit Information  Goals Addressed            This Visit's Progress     Patient Stated   . "I want to work on my diabetes" (pt-stated)       CARE PLAN ENTRY (see longtitudinal plan of care for additional care plan information)  Current Barriers:  . Diabetes: uncontrolled; complicated by chronic medical conditions including HTN, HLD, most recent A1c 7.2% o Recently started on Medicare. Notes that her plan covers Jardiance, not Invokana, so she was switched. However, copays are now significantly expensive . Most recent eGFR: ~87 . Current antihyperglycemic regimen: Ozempic 1 mg weekly, Jardiance 25 mg daily . Cardiovascular risk reduction: o Current hypertensive regimen: HCTZ 12.5 mg daily, losartan 100 mg daily, o Current hyperlipidemia regimen: pravastatin 40 mg daily; LDL 77  o Current antiplatelet regimen: ASA 81 mg   Pharmacist Clinical Goal(s):  Marland Kitchen Over the next 90 days, patient will work with PharmD and primary care provider to address optimized medication management  Interventions: . Comprehensive medication review performed, medication list updated in electronic medical record . Reviewed income. Patient should qualify for Ozempic and Jardiance assistance through the respective drug companies. Will collaborate w/ CPhT to mail patient her portion of application. Patient aware that we will need proof of income; she will provide a copy of her 2020 Tax Return. I will collaborate w/ PCP for completion of provider portion. Once all parts are received, will collaborate w/ CPhT for submission and f/u.   Patient Self Care Activities:  . Patient will check blood glucose daily, document, and provide at future appointments . Patient will take medications as prescribed . Patient will report any questions or concerns to provider   Initial goal documentation        Patient verbalizes understanding of instructions provided today.   Plan:  - Will collaborate w/ patient, provider, and  CPhT as above - Scheduled f/u call 04/13/20  Catie Darnelle Maffucci, PharmD, [place] [phone]

## 2020-03-06 ENCOUNTER — Other Ambulatory Visit: Payer: Self-pay | Admitting: Pharmacy Technician

## 2020-03-06 NOTE — Patient Outreach (Signed)
[place] [facility]) Care Management  03/06/2020  Marshia Bergan Dec 10, 1955 [id number]                                       Medication Assistance Referral  Referral From: [facility] Embedded RPh Catie T.   Medication/Company: [employer] / Jardiance Patient application portion:  Mailed Provider application portion:  N/A Embedded RPh to have signed while in clinic to Marnee Guarneri, NP Provider address/fax verified via: Office website  Medication/Company: Larna Daughters / [employer] Patient application portion:  Education officer, museum portion:  N/A Embedded RPh to have signed while in clinic to Marnee Guarneri, NP Provider address/fax verified via: Office website     Follow up:  Will follow up with patient in 10-14 business days to confirm application(s) have been received.  Edilberto Roosevelt P. Estellar Cadena, [place]  [phone]

## 2020-03-14 ENCOUNTER — Other Ambulatory Visit: Payer: Self-pay | Admitting: Pharmacy Technician

## 2020-03-14 NOTE — Patient Outreach (Signed)
[place] [facility]) Care Management  03/14/2020  Christine Booth 04-13-1956 [id number]   Received both patient and provider portion(s) of patient assistance application(s) for Jardiance with [employer] and Ozempic with [employer]. Faxed completed application and required documents into [employer] and [employer].  Will follow up with company(ies) in 5-15 business days to check status of application(s).  [name] P. [name], [place]  [phone]

## 2020-03-16 ENCOUNTER — Other Ambulatory Visit: Payer: Self-pay | Admitting: Pharmacy Technician

## 2020-03-16 NOTE — Patient Outreach (Signed)
[place] [facility]) Care Management  03/16/2020  Christine Booth 1956/06/20 [id number]    Care coordination call placed to [employer] in regards to patient's Jardiance application.  Spoke to [place] who informed that patient was APPROVED 03/15/2020-12/07/2020. She informed order was placed today and should arrive at patient's home in the next 7-10 business days.  Will follow up with patient in 7-10 business days to confirm receipt of medication.  Christine Booth P. [name], [place]  [phone]

## 2020-03-20 ENCOUNTER — Other Ambulatory Visit: Payer: Self-pay | Admitting: Pharmacy Technician

## 2020-03-20 NOTE — Patient Outreach (Signed)
[name] Care Management  03/20/2020  Christine Booth 1956-05-15 [id number]  Care coordination call placed to [place] in regards to patient's Ozempic application.  Spoke to [place] who informed patient was APPROVED 03/15/2020-11/06/2020. She informed patient would be receiving 4 boxes delivered to the provider's office in the next 10-14 business days.  Will follow up with patient in 10-14 business days to confirm receipt of medication and to discuss refill process.  Christine Booth, [place]  [phone]

## 2020-03-27 ENCOUNTER — Telehealth: Payer: Self-pay

## 2020-03-27 NOTE — Telephone Encounter (Signed)
Received patient's Ozempic. Called and left her a VM letting her know it was here and that she could pick it up.

## 2020-03-29 NOTE — Telephone Encounter (Signed)
Ozempic picked up by the patient.  

## 2020-03-30 ENCOUNTER — Other Ambulatory Visit: Payer: Self-pay

## 2020-03-30 ENCOUNTER — Other Ambulatory Visit: Payer: Medicare Other

## 2020-03-30 ENCOUNTER — Other Ambulatory Visit: Payer: Self-pay | Admitting: Pharmacy Technician

## 2020-03-30 DIAGNOSIS — E876 Hypokalemia: Secondary | ICD-10-CM | POA: Diagnosis not present

## 2020-03-30 NOTE — Patient Outreach (Signed)
[place] [facility]) Care Management  03/30/2020  Cherlyn Heuser 30-Apr-1956 [id number]   Successful call placed to patient regarding patient assistance medication delivery of Jardiance with [employer] and Ozempic with [employer], HIPAA identifiers verified.   Patient informed she received a 90 days supply of Jardiance. Discussed refill procedure with patient which requires patient to call the patient assistance company. Informed patient where to find the phone number on the pharmacy label. Patient verbalized understanding.  Patient also informed she received the Ozempic that was delivered to the provider's office. Discussed refill procedure with patient which requires patient to call the provider's office and inform them she needs a refill from the patient assistance company. Informed her to make sure they understand that she does not use a local pharmacy for this medication but that it comes from the manufacturer. Patient verbalized understanding.  Informed patient to perform the refill process about 2-3 weeks in advance of running out of medication to avoid a delay in therapy.  Confirmed patient had name and number as she had no other questions or concerns.   Follow up:  Will route note to embedded RPh Catie Darnelle Maffucci for case closure  [place] P. Emmalia Heyboer, [place]  [phone]

## 2020-03-31 LAB — POTASSIUM: Potassium: 3.6 mmol/L (ref 3.5–5.2)

## 2020-04-01 NOTE — Progress Notes (Signed)
Contacted via [place] morning [name], your potassium level has returned and is now normal.  Continue taking in foods rich in potassium daily to help keep this level within normal range.  If any questions let me know, for now we will recheck at next visit.  Have a great day!! Keep being awesome!! Kindest regards, Elan Brainerd

## 2020-04-13 ENCOUNTER — Ambulatory Visit (INDEPENDENT_AMBULATORY_CARE_PROVIDER_SITE_OTHER): Payer: Medicare Other | Admitting: Pharmacist

## 2020-04-13 DIAGNOSIS — E785 Hyperlipidemia, unspecified: Secondary | ICD-10-CM

## 2020-04-13 DIAGNOSIS — I152 Hypertension secondary to endocrine disorders: Secondary | ICD-10-CM

## 2020-04-13 DIAGNOSIS — I10 Essential (primary) hypertension: Secondary | ICD-10-CM

## 2020-04-13 DIAGNOSIS — E1159 Type 2 diabetes mellitus with other circulatory complications: Secondary | ICD-10-CM

## 2020-04-13 DIAGNOSIS — E1165 Type 2 diabetes mellitus with hyperglycemia: Secondary | ICD-10-CM | POA: Diagnosis not present

## 2020-04-13 DIAGNOSIS — E1169 Type 2 diabetes mellitus with other specified complication: Secondary | ICD-10-CM

## 2020-04-13 NOTE — Chronic Care Management (AMB) (Signed)
Chronic Care Management   Follow Up Note   04/13/2020 Name: Christine Booth MRN: [id number] DOB: 14-Aug-1956  Referred by: [name], NP Reason for referral : Chronic Care Management (Medication Management)   Christine Booth is a 64 y.o. year old female who is a primary care patient of Cannady, Barbaraann Faster, NP. The CCM team was consulted for assistance with chronic disease management and care coordination needs.    Contacted patient for medication management review.   Review of patient status, including review of consultants reports, relevant laboratory and other test results, and collaboration with appropriate care team members and the patient's provider was performed as part of comprehensive patient evaluation and provision of chronic care management services.    SDOH (Social Determinants of Health) assessments performed: Yes See Care Plan activities for detailed interventions related to [facility])     Outpatient Encounter Medications as of 04/13/2020  Medication Sig Note  . aspirin EC 81 MG tablet Take 81 mg by mouth daily.   . empagliflozin (JARDIANCE) 25 MG TABS tablet Take 25 mg by mouth daily.   . fluticasone (FLONASE) 50 MCG/ACT nasal spray SPRAY 2 SPRAYS INTO EACH NOSTRIL EVERY DAY   . glucose blood test strip Use as instructed   . hydrochlorothiazide (HYDRODIURIL) 12.5 MG tablet TAKE 1 TABLET BY MOUTH EVERY DAY   . losartan (COZAAR) 100 MG tablet TAKE 1 TABLET BY MOUTH EVERY DAY   . Multiple Vitamin (MULTIVITAMIN) tablet Take 1 tablet by mouth daily.   Marland Kitchen OZEMPIC, 1 MG/DOSE, 2 MG/1.5ML SOPN INJECT 1 MG INTO THE SKIN ONCE A WEEK.   . pantoprazole (PROTONIX) 40 MG tablet TAKE 1 TABLET BY MOUTH EVERY DAY   . polyethylene glycol (MIRALAX / GLYCOLAX) 17 g packet Take 17 g by mouth daily as needed.   . pravastatin (PRAVACHOL) 40 MG tablet TAKE 1 TABLET BY MOUTH EVERYDAY AT BEDTIME   . sennosides-docusate sodium (SENOKOT-S) 8.6-50 MG tablet Take 1 tablet by mouth daily as  needed for constipation.   . triamcinolone (KENALOG) 0.025 % cream APPLY TO AFFECTED AREA TWICE A DAY 04/13/2020: For spot on leg  . [DISCONTINUED] miconazole (MICONAZOLE 7) 2 % vaginal cream Place 1 Applicatorful vaginally at bedtime.   . [DISCONTINUED] mupirocin ointment (BACTROBAN) 2 % PLACE 1 APPLICATION INTO THE NOSE 2 (TWO) TIMES DAILY.    No facility-administered encounter medications on file as of 04/13/2020.     Objective:   Goals Addressed            This Visit's Progress     Patient Stated   . "I want to work on my diabetes" (pt-stated)       CARE PLAN ENTRY (see longtitudinal plan of care for additional care plan information)  Current Barriers:  . Diabetes: uncontrolled; complicated by chronic medical conditions including HTN, CAD, most recent A1c 7.2% . Most recent eGFR: ~87 mL/min . Current antihyperglycemic regimen: Ozempic 1 mg weekly, Jardiance 25 mg daily  o APPROVED for [employer] assistance through [employer] through 11/06/20 o APPROVED for [place] assistance through [place] through 12/07/20 . Current meal patterns: o Lives by herself, cooks a big meal on Sundays for her and her 35 YO mother.  o Breakfast: cereal (honey nut cheerios) or a biscuit (sausage, egg, and cheese);  o Lunch: usually eats leftovers (roast, chicken, vegetables); sometimes sandwich; lunch sometimes the biggest meal of the day  o Supper: sometimes hamburger helper meal, usually something quick;  o Drinks: water; diet pepsi in  the evening o Snacks: salty snacks, chips, occasionally sweets; does tend to purchase the individual sizes at [facility] . Current exercise: hasn't been as active lately; found some walking tracks that she would like to walk more; does have a knee problem; has a membership at [employer] but is not comfortable going back yet . Current blood glucose readings:  o Has not been checking recently at home;  . Cardiovascular risk reduction: o Current hypertensive regimen: HCTZ  12.5 mg, losartan 100 mg; last BP in office at goal <130/80 o Current hyperlipidemia regimen: pravastatin 40 mg daily; LDL NOT at goal <70 o Current antiplatelet regimen: ASA 81 mg daily . GI concerns: notes her bowels move between diarrhea and constipation; recommended to get colonoscopy but patient has declined  Pharmacist Clinical Goal(s):  Marland Kitchen Over the next 90 days, patient will work with PharmD and primary care provider to address optimized medication management  Interventions: . Comprehensive medication review performed, medication list updated in electronic medical record . Inter-disciplinary care team collaboration (see longitudinal plan of care) . Reviewed goal A1c, goal fasting, and goal 2 hour post prandial glucoses.  . Reviewed that her current medications are at maximal doses. Encouraged diet modification to reduce carbohydrate services. Extensive discussion of the benefits of exercise - patient notes she would like to set a goal of walking 3 days per week. Provided encouragement. Discussed having an accountability partner.  . Reviewed benefits of checking BG at home to determine control. Reviewed benefits of goal A1c <7%. Patient reports that she doesn't think she will ever "get my sugars as low as Christine Booth wants". Provided encouragement.  . Consider changing pravastatin to high intensity statin to target goal LDL <70 given DM and risk factors of HTN . Encouraged to consider colonoscopy to evaluate possible causes of bowel distress.   Patient Self Care Activities:  . Patient will check blood glucose daily, document, and provide at future appointments . Patient will take medications as prescribed . Patient will report any questions or concerns to provider   Please see past updates related to this goal by clicking on the "Past Updates" button in the selected goal          Plan: - Scheduled f/u call in ~12 weeks  Catie Darnelle Maffucci, PharmD, [place] [phone]

## 2020-04-13 NOTE — Patient Instructions (Signed)
Visit Information  Goals Addressed            This Visit's Progress     Patient Stated   . "I want to work on my diabetes" (pt-stated)       CARE PLAN ENTRY (see longtitudinal plan of care for additional care plan information)  Current Barriers:  . Diabetes: uncontrolled; complicated by chronic medical conditions including HTN, CAD, most recent A1c 7.2% . Most recent eGFR: ~87 mL/min . Current antihyperglycemic regimen: Ozempic 1 mg weekly, Jardiance 25 mg daily  o APPROVED for [employer] assistance through [employer] through 11/06/20 o APPROVED for [place] assistance through [place] through 12/07/20 . Current meal patterns: o Lives by herself, cooks a big meal on Sundays for her and her 4 YO mother.  o Breakfast: cereal (honey nut cheerios) or a biscuit (sausage, egg, and cheese);  o Lunch: usually eats leftovers (roast, chicken, vegetables); sometimes sandwich; lunch sometimes the biggest meal of the day  o Supper: sometimes hamburger helper meal, usually something quick;  o Drinks: water; diet pepsi in the evening o Snacks: salty snacks, chips, occasionally sweets; does tend to purchase the individual sizes at [facility] . Current exercise: hasn't been as active lately; found some walking tracks that she would like to walk more; does have a knee problem; has a membership at [employer] but is not comfortable going back yet . Current blood glucose readings:  o Has not been checking recently at home;  . Cardiovascular risk reduction: o Current hypertensive regimen: HCTZ 12.5 mg, losartan 100 mg; last BP in office at goal <130/80 o Current hyperlipidemia regimen: pravastatin 40 mg daily; LDL NOT at goal <70 o Current antiplatelet regimen: ASA 81 mg daily . GI concerns: notes her bowels move between diarrhea and constipation; recommended to get colonoscopy but patient has declined  Pharmacist Clinical Goal(s):  Marland Kitchen Over the next 90 days, patient will work with PharmD and primary care  provider to address optimized medication management  Interventions: . Comprehensive medication review performed, medication list updated in electronic medical record . Inter-disciplinary care team collaboration (see longitudinal plan of care) . Reviewed goal A1c, goal fasting, and goal 2 hour post prandial glucoses.  . Reviewed that her current medications are at maximal doses. Encouraged diet modification to reduce carbohydrate services. Extensive discussion of the benefits of exercise - patient notes she would like to set a goal of walking 3 days per week. Provided encouragement. Discussed having an accountability partner.  . Reviewed benefits of checking BG at home to determine control. Reviewed benefits of goal A1c <7%. Patient reports that she doesn't think she will ever "get my sugars as low as Jolene wants". Provided encouragement.  . Consider changing pravastatin to high intensity statin to target goal LDL <70 given DM and risk factors of HTN . Encouraged to consider colonoscopy to evaluate possible causes of bowel distress.   Patient Self Care Activities:  . Patient will check blood glucose daily, document, and provide at future appointments . Patient will take medications as prescribed . Patient will report any questions or concerns to provider   Please see past updates related to this goal by clicking on the "Past Updates" button in the selected goal         Patient verbalizes understanding of instructions provided today.   Plan: - Scheduled f/u call in ~12 weeks  Catie Darnelle Maffucci, PharmD, [place] [phone]

## 2020-05-02 ENCOUNTER — Other Ambulatory Visit: Payer: Self-pay | Admitting: Nurse Practitioner

## 2020-05-02 NOTE — Telephone Encounter (Signed)
Requested Prescriptions  Pending Prescriptions Disp Refills  . pantoprazole (PROTONIX) 40 MG tablet [Pharmacy Med Name: PANTOPRAZOLE SOD DR 40 MG TAB] 90 tablet 1    Sig: TAKE 1 TABLET BY MOUTH EVERY DAY     Gastroenterology: Proton Pump Inhibitors Passed - 05/02/2020  1:21 AM      Passed - Valid encounter within last 12 months    Recent Outpatient Visits          2 months ago Uncontrolled type 2 diabetes mellitus with hyperglycemia ([place])   [place], Jolene T, NP   5 months ago Uncontrolled type 2 diabetes mellitus with hyperglycemia ([place])   [place], Jolene T, NP   8 months ago Uncontrolled type 2 diabetes mellitus with hyperglycemia ([place])   [place] Cannady, Barbaraann Faster, NP   12 months ago Annual physical exam   [place], Jolene T, NP   1 year ago Uncontrolled type 2 diabetes mellitus with hyperglycemia ([place])   [place], Barbaraann Faster, NP      Future Appointments            In 3 weeks Cannady, Barbaraann Faster, NP [employer], [facility]

## 2020-05-21 ENCOUNTER — Other Ambulatory Visit: Payer: Self-pay | Admitting: Nurse Practitioner

## 2020-05-21 DIAGNOSIS — I10 Essential (primary) hypertension: Secondary | ICD-10-CM

## 2020-05-21 DIAGNOSIS — E1165 Type 2 diabetes mellitus with hyperglycemia: Secondary | ICD-10-CM

## 2020-05-29 ENCOUNTER — Ambulatory Visit (INDEPENDENT_AMBULATORY_CARE_PROVIDER_SITE_OTHER): Payer: Medicare Other | Admitting: Nurse Practitioner

## 2020-05-29 ENCOUNTER — Other Ambulatory Visit: Payer: Self-pay

## 2020-05-29 ENCOUNTER — Encounter: Payer: Self-pay | Admitting: Nurse Practitioner

## 2020-05-29 ENCOUNTER — Other Ambulatory Visit (HOSPITAL_COMMUNITY)
Admission: RE | Admit: 2020-05-29 | Discharge: 2020-05-29 | Disposition: A | Discharge status: Home / Self Care | Payer: Medicare Other | Source: Ambulatory Visit | Attending: Nurse Practitioner | Admitting: Nurse Practitioner

## 2020-05-29 VITALS — BP 102/69 | HR 83 | Temp 98.3°F | Ht 60.5 in | Wt 165.6 lb

## 2020-05-29 DIAGNOSIS — E1165 Type 2 diabetes mellitus with hyperglycemia: Secondary | ICD-10-CM

## 2020-05-29 DIAGNOSIS — I10 Essential (primary) hypertension: Secondary | ICD-10-CM | POA: Diagnosis not present

## 2020-05-29 DIAGNOSIS — E6609 Other obesity due to excess calories: Secondary | ICD-10-CM

## 2020-05-29 DIAGNOSIS — Z124 Encounter for screening for malignant neoplasm of cervix: Secondary | ICD-10-CM | POA: Insufficient documentation

## 2020-05-29 DIAGNOSIS — Z23 Encounter for immunization: Secondary | ICD-10-CM

## 2020-05-29 DIAGNOSIS — Z780 Asymptomatic menopausal state: Secondary | ICD-10-CM | POA: Insufficient documentation

## 2020-05-29 DIAGNOSIS — I152 Hypertension secondary to endocrine disorders: Secondary | ICD-10-CM

## 2020-05-29 DIAGNOSIS — Z1151 Encounter for screening for human papillomavirus (HPV): Secondary | ICD-10-CM | POA: Diagnosis not present

## 2020-05-29 DIAGNOSIS — Z0000 Encounter for general adult medical examination without abnormal findings: Secondary | ICD-10-CM | POA: Diagnosis not present

## 2020-05-29 DIAGNOSIS — Z6831 Body mass index (BMI) 31.0-31.9, adult: Secondary | ICD-10-CM

## 2020-05-29 DIAGNOSIS — K219 Gastro-esophageal reflux disease without esophagitis: Secondary | ICD-10-CM

## 2020-05-29 DIAGNOSIS — E1169 Type 2 diabetes mellitus with other specified complication: Secondary | ICD-10-CM | POA: Diagnosis not present

## 2020-05-29 DIAGNOSIS — E785 Hyperlipidemia, unspecified: Secondary | ICD-10-CM

## 2020-05-29 DIAGNOSIS — E1159 Type 2 diabetes mellitus with other circulatory complications: Secondary | ICD-10-CM | POA: Diagnosis not present

## 2020-05-29 DIAGNOSIS — Z1231 Encounter for screening mammogram for malignant neoplasm of breast: Secondary | ICD-10-CM | POA: Diagnosis not present

## 2020-05-29 LAB — BAYER DCA HB A1C WAIVED: HB A1C (BAYER DCA - WAIVED): 7.3 % — ABNORMAL HIGH (ref <7.0)

## 2020-05-29 MED ORDER — PRAVASTATIN SODIUM 40 MG PO TABS: ORAL_TABLET | ORAL | 4 refills | Status: DC

## 2020-05-29 MED ORDER — LOSARTAN POTASSIUM 100 MG PO TABS: 100.0000 mg | ORAL_TABLET | Freq: Every day | ORAL | 4 refills | Status: DC

## 2020-05-29 MED ORDER — HYDROCHLOROTHIAZIDE 12.5 MG PO TABS: 12.5000 mg | ORAL_TABLET | Freq: Every day | ORAL | 4 refills | Status: DC

## 2020-05-29 MED ORDER — PANTOPRAZOLE SODIUM 40 MG PO TBEC: 40.0000 mg | DELAYED_RELEASE_TABLET | Freq: Every day | ORAL | 4 refills | Status: DC

## 2020-05-29 NOTE — Patient Instructions (Signed)

## 2020-05-29 NOTE — Assessment & Plan Note (Signed)
Chronic, ongoing.  Recent lipid panel with LDL 77.  Continue current medication regimen and adjust as needed.  Lipid panel today, discussed changing to high dose statin (Crestor) if elevation in LDL noted above 70, she agrees with this plan.

## 2020-05-29 NOTE — Assessment & Plan Note (Signed)
Chronic, stable at this time.  Obtain mag level yearly, will obtain next in December.  Continue current medication regimen and collaboration with GI team.

## 2020-05-29 NOTE — Assessment & Plan Note (Signed)
Chronic, ongoing with A1C today 7.3%.  At this time continue Ozempic and Jardiance, she is at max dose of each.  Could consider trial of Metformin XR in future, will continue to collaborate with CCM team.  At this time she would prefer not to add additional medication and would like to focus on diet and regular exercise.  Recommend continue to check BS daily and return to regular exercise and diet focus to maintain A1C <7.  Return in 3 months.

## 2020-05-29 NOTE — Progress Notes (Signed)
BP 102/69   Pulse 83   Temp 98.3 F (36.8 C) (Oral)   Ht 5' 0.5" (1.537 m)   Wt 165 lb 9.6 oz (75.1 kg)   LMP  (LMP Unknown)   SpO2 98%   BMI 31.81 kg/m    Subjective:    Patient ID: Christine Booth, female    DOB: 07-18-1956, 64 y.o.   MRN: [id number]  HPI: Christine Booth is a 64 y.o. female presenting on 05/29/2020 for comprehensive medical examination. Current medical complaints include:none  She currently lives with: self Menopausal Symptoms: no   DIABETES Continues on Jardiance 25 MG daily and Ozempic 1 MG weekly, she is working with CCM team on cost of medications Last visit A1C 7.2%. Has not been exercising, has not returned to [employer].  Has been going for walks.    Did take Metformin in the beginning, caused some constipation.  Does have underlying issues with constipation, has had issues for several years.  Takes Senna-Docusate (takes this as needed), Miralax (has not taken in a long time). Last colonoscopy was in 2018, she was to repeat in one year but did not attend this.  Reports fears about this, which PCP discussed at length with her importance of repeat screening to ensure no cancerous polyps present.  She reports wishes for no treatment if something were present.  Educated her on colon CA.   Hypoglycemic episodes:no Polydipsia/polyuria:no Visual disturbance:no Chest pain:no Paresthesias:no Glucose Monitoring:yes Accucheck frequency: once every couple weeks Fasting glucose: 160 - 165 in the morning Post prandial: Evening: Before meals: Taking Insulin?:no Long acting insulin: Short acting insulin: Blood Pressure Monitoring:daily Retinal Examination:Not up to Date Foot Exam:Up to Date Pneumovax:Up to Date Influenza:Up to Date Aspirin:yes  HYPERTENSION / HYPERLIPIDEMIA Continues on HCTZ 12.5 MG daily, Losartan 100 MG daily, and Pravastatin 40  MG daily + ASA.  Last LDL 77. Satisfied with current treatment?yes Duration of hypertension:chronic BP monitoring frequency:once every two weeks BP range:110-120/70-80 range BP medication side effects:no Duration of hyperlipidemia:chronic Cholesterol medication side effects:no Cholesterol supplements: none Medication compliance:good compliance Aspirin:yes Recent stressors:no Recurrent headaches:no Visual changes:no Palpitations:no Dyspnea:no Chest pain:no Lower extremity edema:no Dizzy/lightheaded:no  GERD Continues on daily Protonix daily.  Last Mag level 2.3 in December 2020. GERD control status: stable  Satisfied with current treatment? yes Heartburn frequency: none Medication side effects: no  Medication compliance: stable Dysphagia: no Odynophagia:  no Hematemesis: no Blood in stool: no EGD: yes   Depression Screen done today and results listed below:  Depression screen [facility] 2/9 05/29/2020 05/04/2019 01/22/2018 01/12/2017 12/11/2015  Decreased Interest 0 0 0 0 0  Down, Depressed, Hopeless 0 0 0 0 0  PHQ - 2 Score 0 0 0 0 0  Altered sleeping - 0 0 0 -  Tired, decreased energy - 0 0 0 -  Change in appetite - 0 0 0 -  Feeling bad or failure about yourself  - 0 0 0 -  Trouble concentrating - 0 0 0 -  Moving slowly or fidgety/restless - 0 0 0 -  Suicidal thoughts - 0 0 0 -  PHQ-9 Score - 0 0 0 -    The patient does not have a history of falls. I did not complete a risk assessment for falls. A plan of care for falls was not documented.   Past Medical History:  Past Medical History:  Diagnosis Date  . Allergy   . 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  . Chronic kidney  disease stage 1  . Diabetes mellitus without complication ([place])   . Gastrointestinal disorder   . GERD (gastroesophageal reflux disease)   . Heart murmur   . Hyperlipidemia   . Hypertension   . Rosacea     Surgical History:  Past Surgical History:  Procedure  Laterality Date  . COLONOSCOPY WITH PROPOFOL N/A 02/06/2017   Procedure: COLONOSCOPY WITH PROPOFOL;  Surgeon: Jonathon Bellows, MD;  Location: [facility] ENDOSCOPY;  Service: Endoscopy;  Laterality: N/A;  . TUBAL LIGATION  1985    Medications:  Current Outpatient Medications on File Prior to Visit  Medication Sig  . aspirin EC 81 MG tablet Take 81 mg by mouth daily.  . empagliflozin (JARDIANCE) 25 MG TABS tablet Take 25 mg by mouth daily.  . fluticasone (FLONASE) 50 MCG/ACT nasal spray SPRAY 2 SPRAYS INTO EACH NOSTRIL EVERY DAY  . glucose blood test strip Use as instructed  . Multiple Vitamin (MULTIVITAMIN) tablet Take 1 tablet by mouth daily.  Marland Kitchen OZEMPIC, 1 MG/DOSE, 2 MG/1.5ML SOPN INJECT 1 MG INTO THE SKIN ONCE A WEEK.  . polyethylene glycol (MIRALAX / GLYCOLAX) 17 g packet Take 17 g by mouth daily as needed.  . sennosides-docusate sodium (SENOKOT-S) 8.6-50 MG tablet Take 1 tablet by mouth daily as needed for constipation.  . triamcinolone (KENALOG) 0.025 % cream APPLY TO AFFECTED AREA TWICE A DAY   No current facility-administered medications on file prior to visit.    Allergies:  No Known Allergies  Social History:  Social History   Socioeconomic History  . Marital status: Divorced    Spouse name: Not on file  . Number of children: Not on file  . Years of education: Not on file  . Highest education level: Not on file  Occupational History  . Not on file  Tobacco Use  . Smoking status: Never Smoker  . Smokeless tobacco: Never Used  Vaping Use  . Vaping Use: Never used  Substance and Sexual Activity  . Alcohol use: No    Alcohol/week: 0.0 standard drinks  . Drug use: No  . Sexual activity: Never  Other Topics Concern  . Not on file  Social History Narrative  . Not on file   Social Determinants of Health   Financial Resource Strain:   . Difficulty of Paying Living Expenses:   Food Insecurity:   . Worried About Charity fundraiser in the Last Year:   . Arboriculturist in the  Last Year:   Transportation Needs:   . Film/video editor (Medical):   Marland Kitchen Lack of Transportation (Non-Medical):   Physical Activity:   . Days of Exercise per Week:   . Minutes of Exercise per Session:   Stress:   . Feeling of Stress :   Social Connections:   . Frequency of Communication with Friends and Family:   . Frequency of Social Gatherings with Friends and Family:   . Attends Religious Services:   . Active Member of Clubs or Organizations:   . Attends Archivist Meetings:   Marland Kitchen Marital Status:   Intimate Partner Violence:   . Fear of Current or Ex-Partner:   . Emotionally Abused:   Marland Kitchen Physically Abused:   . Sexually Abused:    Social History   Tobacco Use  Smoking Status Never Smoker  Smokeless Tobacco Never Used   Social History   Substance and Sexual Activity  Alcohol Use No  . Alcohol/week: 0.0 standard drinks    Family History:  Family History  Problem Relation Age of Onset  . Arthritis Mother   . Cancer Mother        breast  . Heart disease Mother   . Cancer Father        bladder  . Heart disease Father   . Hypertension Father   . Aneurysm Sister   . Cancer Sister        breast    Past medical history, surgical history, medications, allergies, family history and social history reviewed with patient today and changes made to appropriate areas of the chart.   Review of Systems - negative All other ROS negative except what is listed above and in the HPI.      Objective:    BP 102/69   Pulse 83   Temp 98.3 F (36.8 C) (Oral)   Ht 5' 0.5" (1.537 m)   Wt 165 lb 9.6 oz (75.1 kg)   LMP  (LMP Unknown)   SpO2 98%   BMI 31.81 kg/m   Wt Readings from Last 3 Encounters:  05/29/20 165 lb 9.6 oz (75.1 kg)  11/09/19 162 lb (73.5 kg)  08/09/19 166 lb 3.2 oz (75.4 kg)    Physical Exam Vitals and nursing note reviewed.  Constitutional:      General: She is awake. She is not in acute distress.    Appearance: She is well-developed and  well-groomed. She is obese. She is not ill-appearing.  HENT:     Head: Normocephalic and atraumatic.     Right Ear: Hearing, tympanic membrane, ear canal and external ear normal. No drainage.     Left Ear: Hearing, tympanic membrane, ear canal and external ear normal. No drainage.     Nose: Nose normal.     Right Sinus: No maxillary sinus tenderness or frontal sinus tenderness.     Left Sinus: No maxillary sinus tenderness or frontal sinus tenderness.     Mouth/Throat:     Mouth: Mucous membranes are moist.     Pharynx: Oropharynx is clear. Uvula midline. No pharyngeal swelling, oropharyngeal exudate or posterior oropharyngeal erythema.  Eyes:     General: Lids are normal.        Right eye: No discharge.        Left eye: No discharge.     Extraocular Movements: Extraocular movements intact.     Conjunctiva/sclera: Conjunctivae normal.     Pupils: Pupils are equal, round, and reactive to light.     Visual Fields: Right eye visual fields normal and left eye visual fields normal.  Neck:     Thyroid: No thyromegaly.     Vascular: No carotid bruit.     Trachea: Trachea normal.  Cardiovascular:     Rate and Rhythm: Normal rate and regular rhythm.     Heart sounds: Normal heart sounds. No murmur heard.  No gallop.   Pulmonary:     Effort: Pulmonary effort is normal. No accessory muscle usage or respiratory distress.     Breath sounds: Normal breath sounds.  Chest:     Breasts:        Right: Normal.        Left: Normal.  Abdominal:     General: Bowel sounds are normal.     Palpations: Abdomen is soft. There is no hepatomegaly or splenomegaly.     Tenderness: There is no abdominal tenderness.  Genitourinary:    Exam position: Lithotomy position.     Labia:        Right:  No rash.        Left: No rash.      Vagina: Normal.     Cervix: Normal.     Uterus: Normal.      Adnexa: Right adnexa normal and left adnexa normal.     Rectum: Normal.     Comments: Declined chaperone.  Cervix  anterior and pap sample obtained. Musculoskeletal:        General: Normal range of motion.     Cervical back: Normal range of motion and neck supple.     Right lower leg: No edema.     Left lower leg: No edema.  Lymphadenopathy:     Head:     Right side of head: No submental, submandibular, tonsillar, preauricular or posterior auricular adenopathy.     Left side of head: No submental, submandibular, tonsillar, preauricular or posterior auricular adenopathy.     Cervical: No cervical adenopathy.     Upper Body:     Right upper body: No supraclavicular, axillary or pectoral adenopathy.     Left upper body: No supraclavicular, axillary or pectoral adenopathy.  Skin:    General: Skin is warm and dry.     Capillary Refill: Capillary refill takes less than 2 seconds.     Findings: No rash.  Neurological:     Mental Status: She is alert and oriented to person, place, and time.     Cranial Nerves: Cranial nerves are intact.     Gait: Gait is intact.     Deep Tendon Reflexes: Reflexes are normal and symmetric.     Reflex Scores:      Brachioradialis reflexes are 2+ on the right side and 2+ on the left side.      Patellar reflexes are 2+ on the right side and 2+ on the left side. Psychiatric:        Attention and Perception: Attention normal.        Mood and Affect: Mood normal.        Speech: Speech normal.        Behavior: Behavior normal. Behavior is cooperative.        Thought Content: Thought content normal.        Judgment: Judgment normal.    Results for orders placed or performed in visit on 03/30/20  Potassium  Result Value Ref Range   Potassium 3.6 3.5 - 5.2 mmol/L      Assessment & Plan:   Problem List Items Addressed This Visit      Cardiovascular and Mediastinum   Hypertension associated with diabetes ([place])    Chronic, stable with BP below goal on home readings and in office today.  Continue current medication regimen and adjust as needed.  CMP, CBC, and TSH today.   Continue to monitor BP at home regularly and focus on DASH diet + regular exercise.  Return in 3 months.      Relevant Medications   pravastatin (PRAVACHOL) 40 MG tablet   losartan (COZAAR) 100 MG tablet   hydrochlorothiazide (HYDRODIURIL) 12.5 MG tablet   Other Relevant Orders   Bayer DCA Hb A1c Waived   CBC with Differential/Platelet   TSH     Digestive   GERD (gastroesophageal reflux disease)    Chronic, stable at this time.  Obtain mag level yearly, will obtain next in December.  Continue current medication regimen and collaboration with GI team.        Relevant Medications   pantoprazole (PROTONIX) 40 MG tablet  Endocrine   Hyperlipidemia associated with type 2 diabetes mellitus (HCC)    Chronic, ongoing.  Recent lipid panel with LDL 77.  Continue current medication regimen and adjust as needed.  Lipid panel today, discussed changing to high dose statin (Crestor) if elevation in LDL noted above 70, she agrees with this plan.      Relevant Medications   pravastatin (PRAVACHOL) 40 MG tablet   losartan (COZAAR) 100 MG tablet   Other Relevant Orders   Bayer DCA Hb A1c Waived   Comprehensive metabolic panel   Lipid Panel w/o Chol/HDL Ratio   Uncontrolled type 2 diabetes mellitus (HCC)    Chronic, ongoing with A1C today 7.3%.  At this time continue Ozempic and Jardiance, she is at max dose of each.  Could consider trial of Metformin XR in future, will continue to collaborate with CCM team.  At this time she would prefer not to add additional medication and would like to focus on diet and regular exercise.  Recommend continue to check BS daily and return to regular exercise and diet focus to maintain A1C <7.  Return in 3 months.      Relevant Medications   pravastatin (PRAVACHOL) 40 MG tablet   losartan (COZAAR) 100 MG tablet   hydrochlorothiazide (HYDRODIURIL) 12.5 MG tablet   Other Relevant Orders   Bayer DCA Hb A1c Waived     Other   Obesity    BMI 31.81.  Recommended  eating smaller high protein, low fat meals more frequently and exercising 30 mins a day 5 times a week with a goal of 10-15lb weight loss in the next 3 months. Patient voiced their understanding and motivation to adhere to these recommendations.        Other Visit Diagnoses    Encounter for annual physical exam    -  Primary   Need for pneumococcal vaccination       Pneumonia shot provided   Relevant Orders   Pneumococcal conjugate vaccine 13-valent IM (Completed)   Encounter for screening mammogram for malignant neoplasm of breast       Mammogram ordered   Relevant Orders   MM 3D SCREEN BREAST BILATERAL   Cervical cancer screening       Pap obtained   Relevant Orders   Cytology - PAP   Postmenopausal estrogen deficiency       DEXA scan ordered   Relevant Orders   DG Bone Density       Follow up plan: Return in about 3 months (around 08/29/2020) for T2DM, HTN/HLD, GERD.   LABORATORY TESTING:  - Pap smear: pap done  IMMUNIZATIONS:   - Tdap: Tetanus vaccination status reviewed: last tetanus booster within 10 years. - Influenza: Up to date - Pneumovax: Up to date - Prevnar: provided today - HPV: Not applicable - Zostavax vaccine: Up to date  SCREENING: -Mammogram: Ordered today  - Colonoscopy: Refused -- is aware of risks of not having performed - Bone Density: Ordered today  -Hearing Test: Not applicable  -Spirometry: Not applicable   PATIENT COUNSELING:   Advised to take 1 mg of folate supplement per day if capable of pregnancy.   Sexuality: Discussed sexually transmitted diseases, partner selection, use of condoms, avoidance of unintended pregnancy  and contraceptive alternatives.   Advised to avoid cigarette smoking.  I discussed with the patient that most people either abstain from alcohol or drink within safe limits (<=14/week and <=4 drinks/occasion for males, <=7/weeks and <= 3 drinks/occasion for females) and that  the risk for alcohol disorders and other  health effects rises proportionally with the number of drinks per week and how often a drinker exceeds daily limits.  Discussed cessation/primary prevention of drug use and availability of treatment for abuse.   Diet: Encouraged to adjust caloric intake to maintain  or achieve ideal body weight, to reduce intake of dietary saturated fat and total fat, to limit sodium intake by avoiding high sodium foods and not adding table salt, and to maintain adequate dietary potassium and calcium preferably from fresh fruits, vegetables, and low-fat dairy products.    stressed the importance of regular exercise  Injury prevention: Discussed safety belts, safety helmets, smoke detector, smoking near bedding or upholstery.   Dental health: Discussed importance of regular tooth brushing, flossing, and dental visits.    NEXT PREVENTATIVE PHYSICAL DUE IN 1 YEAR. Return in about 3 months (around 08/29/2020) for T2DM, HTN/HLD, GERD.

## 2020-05-29 NOTE — Assessment & Plan Note (Signed)
Chronic, stable with BP below goal on home readings and in office today.  Continue current medication regimen and adjust as needed.  CMP, CBC, and TSH today.  Continue to monitor BP at home regularly and focus on DASH diet + regular exercise.  Return in 3 months.

## 2020-05-29 NOTE — Assessment & Plan Note (Signed)
BMI 31.81.  Recommended eating smaller high protein, low fat meals more frequently and exercising 30 mins a day 5 times a week with a goal of 10-15lb weight loss in the next 3 months. Patient voiced their understanding and motivation to adhere to these recommendations.

## 2020-05-30 LAB — LIPID PANEL W/O CHOL/HDL RATIO
Cholesterol, Total: 158 mg/dL (ref 100–199)
HDL: 48 mg/dL (ref >39)
LDL Chol Calc (NIH): 83 mg/dL (ref 0–99)
Triglycerides: 158 mg/dL — ABNORMAL HIGH (ref 0–149)
VLDL Cholesterol Cal: 27 mg/dL (ref 5–40)

## 2020-05-30 LAB — CBC WITH DIFFERENTIAL/PLATELET
Basophils Absolute: 0.1 10*3/uL (ref 0.0–0.2)
Basos: 1 %
EOS (ABSOLUTE): 0.2 10*3/uL (ref 0.0–0.4)
Eos: 3 %
Hematocrit: 44.6 % (ref 34.0–46.6)
Hemoglobin: 15.2 g/dL (ref 11.1–15.9)
Immature Grans (Abs): 0 10*3/uL (ref 0.0–0.1)
Immature Granulocytes: 0 %
Lymphocytes Absolute: 3 10*3/uL (ref 0.7–3.1)
Lymphs: 46 %
MCH: 31.1 pg (ref 26.6–33.0)
MCHC: 34.1 g/dL (ref 31.5–35.7)
MCV: 91 fL (ref 79–97)
Monocytes Absolute: 0.5 10*3/uL (ref 0.1–0.9)
Monocytes: 7 %
Neutrophils Absolute: 2.8 10*3/uL (ref 1.4–7.0)
Neutrophils: 43 %
Platelets: 259 10*3/uL (ref 150–450)
RBC: 4.89 x10E6/uL (ref 3.77–5.28)
RDW: 12.7 % (ref 11.7–15.4)
WBC: 6.5 10*3/uL (ref 3.4–10.8)

## 2020-05-30 LAB — COMPREHENSIVE METABOLIC PANEL
ALT: 17 IU/L (ref 0–32)
AST: 17 IU/L (ref 0–40)
Albumin/Globulin Ratio: 2.8 — ABNORMAL HIGH (ref 1.2–2.2)
Albumin: 4.7 g/dL (ref 3.8–4.8)
Alkaline Phosphatase: 66 IU/L (ref 48–121)
BUN/Creatinine Ratio: 21 (ref 12–28)
BUN: 15 mg/dL (ref 8–27)
Bilirubin Total: 0.5 mg/dL (ref 0.0–1.2)
CO2: 22 mmol/L (ref 20–29)
Calcium: 9.6 mg/dL (ref 8.7–10.3)
Chloride: 101 mmol/L (ref 96–106)
Creatinine, Ser: 0.72 mg/dL (ref 0.57–1.00)
GFR calc Af Amer: 102 mL/min/{1.73_m2} (ref >59)
GFR calc non Af Amer: 88 mL/min/{1.73_m2} (ref >59)
Globulin, Total: 1.7 g/dL (ref 1.5–4.5)
Glucose: 143 mg/dL — ABNORMAL HIGH (ref 65–99)
Potassium: 3.8 mmol/L (ref 3.5–5.2)
Sodium: 138 mmol/L (ref 134–144)
Total Protein: 6.4 g/dL (ref 6.0–8.5)

## 2020-05-30 LAB — TSH: TSH: 1.6 u[IU]/mL (ref 0.450–4.500)

## 2020-05-30 NOTE — Progress Notes (Signed)
Contacted via [place] morning Christine Booth, your labs have returned.  Overall labs look within normal ranges with exception of cholesterol.  LDL is above goal of < 70, currently it is 83.  I do recommend changing from  Pravastatin 40 MG daily to a higher dose statin called Rosuvastatin 40 MG.  If you are okay with this change please let me know and I will send in.  You would then discontinue taking Pravastatin and start taking the Rosuvastatin.  Any questions or concerns? Keep being awesome!! Kindest regards, Marquan Vokes

## 2020-05-31 LAB — CYTOLOGY - PAP
Comment: NEGATIVE
Diagnosis: NEGATIVE
High risk HPV: NEGATIVE

## 2020-06-01 NOTE — Progress Notes (Signed)
Contacted via [place] morning Christine Booth I have great news!!  Your pap smear was negative and HPV testing negative -- if you like this can be your last pap since you have had negative results prior as well.  Have a great day!!

## 2020-06-05 ENCOUNTER — Other Ambulatory Visit: Payer: Self-pay | Admitting: Nurse Practitioner

## 2020-06-05 MED ORDER — ROSUVASTATIN CALCIUM 40 MG PO TABS: 40.0000 mg | ORAL_TABLET | Freq: Every day | ORAL | 3 refills | Status: DC

## 2020-07-13 ENCOUNTER — Telehealth: Payer: Self-pay

## 2020-07-20 ENCOUNTER — Telehealth: Payer: Self-pay

## 2020-08-01 ENCOUNTER — Telehealth: Payer: Self-pay | Admitting: Pharmacist

## 2020-08-01 NOTE — Progress Notes (Signed)
  Chronic Care Management   Note  08/01/2020 Name: Christine Booth MRN: [id number] DOB: 02/20/1956     I called Christine Booth and gave her the number to call for Jardiance refills. BI [phone]. She had no further questions or concerns.  Junita Push. Kenton Kingfisher PharmD, [facility] [phone]

## 2020-08-01 NOTE — Telephone Encounter (Signed)
Hi Christine Booth, Happy to help. I called Christine Booth and gave her the number to call for Jardiance. She had no other questions.

## 2020-08-10 ENCOUNTER — Other Ambulatory Visit: Payer: Self-pay | Admitting: Nurse Practitioner

## 2020-08-10 DIAGNOSIS — G8929 Other chronic pain: Secondary | ICD-10-CM

## 2020-08-15 NOTE — Telephone Encounter (Signed)
Jolene, I have completed the refill form and placed in your mailbox for signature. Would you mind asking the ladies up front to fax with a cover sheet to the number listed atop the page? Thank You.

## 2020-08-20 ENCOUNTER — Other Ambulatory Visit: Payer: Self-pay | Admitting: Nurse Practitioner

## 2020-08-20 NOTE — Telephone Encounter (Signed)
Requested Prescriptions  Pending Prescriptions Disp Refills   triamcinolone (KENALOG) 0.025 % cream [Pharmacy Med Name: TRIAMCINOLONE 0.025% CREAM] 30 g 1    Sig: APPLY TO AFFECTED AREA TWICE A DAY     Dermatology:  Corticosteroids Passed - 08/20/2020  9:22 PM      Passed - Valid encounter within last 12 months    Recent Outpatient Visits          2 months ago Encounter for annual physical exam   [place], Jolene T, NP   5 months ago Uncontrolled type 2 diabetes mellitus with hyperglycemia ([place])   [place], Jolene T, NP   9 months ago Uncontrolled type 2 diabetes mellitus with hyperglycemia ([place])   [place], Jolene T, NP   1 year ago Uncontrolled type 2 diabetes mellitus with hyperglycemia ([place])   [place], Barbaraann Faster, NP   1 year ago Annual physical exam   [place], Barbaraann Faster, NP      Future Appointments            In 2 weeks [place], Barbaraann Faster, NP [employer]

## 2020-08-24 DIAGNOSIS — Z1231 Encounter for screening mammogram for malignant neoplasm of breast: Secondary | ICD-10-CM | POA: Diagnosis not present

## 2020-08-24 DIAGNOSIS — M7542 Impingement syndrome of left shoulder: Secondary | ICD-10-CM | POA: Diagnosis not present

## 2020-08-24 DIAGNOSIS — M1711 Unilateral primary osteoarthritis, right knee: Secondary | ICD-10-CM | POA: Diagnosis not present

## 2020-08-24 LAB — HM MAMMOGRAPHY

## 2020-08-31 ENCOUNTER — Telehealth: Payer: Self-pay

## 2020-08-31 NOTE — Telephone Encounter (Signed)
4 boxes of Ozempic received for the patient. Called and LVM letting her know that it was here and ready to be picked up.

## 2020-09-03 NOTE — Telephone Encounter (Signed)
Medication picked up by the patient.  

## 2020-09-07 ENCOUNTER — Ambulatory Visit: Payer: Medicare Other | Admitting: Nurse Practitioner

## 2020-11-09 ENCOUNTER — Ambulatory Visit (INDEPENDENT_AMBULATORY_CARE_PROVIDER_SITE_OTHER): Payer: Medicare Other

## 2020-11-09 VITALS — Ht 62.0 in | Wt 160.0 lb

## 2020-11-09 DIAGNOSIS — Z0000 Encounter for general adult medical examination without abnormal findings: Secondary | ICD-10-CM | POA: Diagnosis not present

## 2020-11-09 NOTE — Progress Notes (Signed)
I connected with Christine Booth today by telephone and verified that I am speaking with the correct person using two identifiers. Location patient: home Location provider: work Persons participating in the virtual visit: patient, provider.   I discussed the limitations, risks, security and privacy concerns of performing an evaluation and management service by telephone and the availability of in person appointments. I also discussed with the patient that there may be a patient responsible charge related to this service. The patient expressed understanding and verbally consented to this telephonic visit.    Interactive audio and video telecommunications were attempted between this provider and patient, however failed, due to provider having technical difficulties OR patient did not have access to video capability.  We continued and completed visit with audio only.     Vital signs may be patient reported or missing.  Subjective:   Christine Booth is a 64 y.o. female who presents for an Initial Medicare Annual Wellness Visit.  Review of Systems     Cardiac Risk Factors include: advanced age (>23mn, >>25women);diabetes mellitus;dyslipidemia;hypertension;sedentary lifestyle     Objective:    Today's Vitals   11/09/20 1257  Weight: 160 lb (72.6 kg)  Height: 5' 2"  (1.575 m)   Body mass index is 29.26 kg/m.  Advanced Directives 11/09/2020 02/06/2017  Does Patient Have a Medical Advance Directive? No No    Current Medications (verified) Outpatient Encounter Medications as of 11/09/2020  Medication Sig  . aspirin EC 81 MG tablet Take 81 mg by mouth daily.  . empagliflozin (JARDIANCE) 25 MG TABS tablet Take 25 mg by mouth daily.  . fluticasone (FLONASE) 50 MCG/ACT nasal spray SPRAY 2 SPRAYS INTO EACH NOSTRIL EVERY DAY  . glucose blood test strip Use as instructed  . hydrochlorothiazide (HYDRODIURIL) 12.5 MG tablet Take 1 tablet (12.5 mg total) by mouth daily.  .Marland Kitchenlosartan (COZAAR)  100 MG tablet Take 1 tablet (100 mg total) by mouth daily.  . meloxicam (MOBIC) 15 MG tablet meloxicam 15 mg tablet  TAKE 1 TABLET BY MOUTH EVERY DAY  . Multiple Vitamin (MULTIVITAMIN) tablet Take 1 tablet by mouth daily.  .Marland KitchenOZEMPIC, 1 MG/DOSE, 2 MG/1.5ML SOPN INJECT 1 MG INTO THE SKIN ONCE A WEEK.  . pantoprazole (PROTONIX) 40 MG tablet Take 1 tablet (40 mg total) by mouth daily.  . polyethylene glycol (MIRALAX / GLYCOLAX) 17 g packet Take 17 g by mouth daily as needed.  . rosuvastatin (CRESTOR) 40 MG tablet Take 1 tablet (40 mg total) by mouth daily.  . sennosides-docusate sodium (SENOKOT-S) 8.6-50 MG tablet Take 1 tablet by mouth daily as needed for constipation.  . triamcinolone (KENALOG) 0.025 % cream APPLY TO AFFECTED AREA TWICE A DAY   No facility-administered encounter medications on file as of 11/09/2020.    Allergies (verified) Patient has no known allergies.   History: Past Medical History:  Diagnosis Date  . Allergy   . 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  . Chronic kidney disease stage 1  . Diabetes mellitus without complication ([place]   . Gastrointestinal disorder   . GERD (gastroesophageal reflux disease)   . Heart murmur   . Hyperlipidemia   . Hypertension   . Rosacea    Past Surgical History:  Procedure Laterality Date  . COLONOSCOPY WITH PROPOFOL N/A 02/06/2017   Procedure: COLONOSCOPY WITH PROPOFOL;  Surgeon: KJonathon Bellows MD;  Location: [facility] ENDOSCOPY;  Service: Endoscopy;  Laterality: N/A;  . TUBAL LIGATION  1985   Family History  Problem Relation  Age of Onset  . Arthritis Mother   . Cancer Mother        breast  . Heart disease Mother   . Cancer Father        bladder  . Heart disease Father   . Hypertension Father   . Aneurysm Sister   . Cancer Sister        breast   Social History   Socioeconomic History  . Marital status: Divorced    Spouse name: Not on file  . Number of children: Not on file  . Years of education: Not on  file  . Highest education level: Not on file  Occupational History  . Not on file  Tobacco Use  . Smoking status: Never Smoker  . Smokeless tobacco: Never Used  Vaping Use  . Vaping Use: Never used  Substance and Sexual Activity  . Alcohol use: No    Alcohol/week: 0.0 standard drinks  . Drug use: No  . Sexual activity: Not Currently  Other Topics Concern  . Not on file  Social History Narrative  . Not on file   Social Determinants of Health   Financial Resource Strain: Low Risk   . Difficulty of Paying Living Expenses: Not hard at all  Food Insecurity: No Food Insecurity  . Worried About Charity fundraiser in the Last Year: Never true  . Ran Out of Food in the Last Year: Never true  Transportation Needs: No Transportation Needs  . Lack of Transportation (Medical): No  . Lack of Transportation (Non-Medical): No  Physical Activity: Inactive  . Days of Exercise per Week: 0 days  . Minutes of Exercise per Session: 0 min  Stress: Stress Concern Present  . Feeling of Stress : To some extent  Social Connections:   . Frequency of Communication with Friends and Family: Not on file  . Frequency of Social Gatherings with Friends and Family: Not on file  . Attends Religious Services: Not on file  . Active Member of Clubs or Organizations: Not on file  . Attends Archivist Meetings: Not on file  . Marital Status: Not on file    Tobacco Counseling Counseling given: Not Answered   Clinical Intake:  Pre-visit preparation completed: Yes  Pain : No/denies pain     Nutritional Status: BMI 25 -29 Overweight Nutritional Risks: None Diabetes: Yes  How often do you need to have someone help you when you read instructions, pamphlets, or other written materials from your doctor or pharmacy?: 1 - Never What is the last grade level you completed in school?: 12th grade  Diabetic? Yes Nutrition Risk Assessment:  Has the patient had any N/V/D within the last 2 months?   No  Does the patient have any non-healing wounds?  No  Has the patient had any unintentional weight loss or weight gain?  No   Diabetes:  Is the patient diabetic?  No  If diabetic, was a CBG obtained today?  No  Did the patient bring in their glucometer from home?  No  How often do you monitor your CBG's? weekly.   Financial Strains and Diabetes Management:  Are you having any financial strains with the device, your supplies or your medication? No .  Does the patient want to be seen by Chronic Care Management for management of their diabetes?  No  Would the patient like to be referred to a Nutritionist or for Diabetic Management?  No   Diabetic Exams:  Diabetic Eye Exam:  Overdue for diabetic eye exam. Pt has been advised about the importance in completing this exam. Patient advised to call and schedule an eye exam. Diabetic Foot Exam: Completed 02/27/2020   Interpreter Needed?: No  Information entered by :: NAllen LPN   Activities of Daily Living In your present state of health, do you have any difficulty performing the following activities: 11/09/2020 05/29/2020  Hearing? N N  Vision? N N  Difficulty concentrating or making decisions? N N  Walking or climbing stairs? N N  Dressing or bathing? N N  Doing errands, shopping? N N  Preparing Food and eating ? N -  Using the Toilet? N -  In the past six months, have you accidently leaked urine? N -  Do you have problems with loss of bowel control? N -  Managing your Medications? N -  Managing your Finances? N -  Housekeeping or managing your Housekeeping? N -  Some recent data might be hidden    Patient Care Team: [name], NP as PCP - General (Nurse Practitioner)  Indicate any recent Medical Services you may have received from other than [facility] providers in the past year (date may be approximate).     Assessment:   This is a routine wellness examination for Christine Booth.  Hearing/Vision screen  Hearing Screening    125Hz  250Hz  500Hz  1000Hz  2000Hz  3000Hz  4000Hz  6000Hz  8000Hz   Right ear:           Left ear:           Vision Screening Comments: Regular eye exams, [facility], Dr. Wyatt Portela  Dietary issues and exercise activities discussed: Current Exercise Habits: The patient has a physically strenuous job, but has no regular exercise apart from work.  Goals    .  "I want to keep my blood pressure in check" (pt-stated)      Current Barriers:  . Hypertension, currently well managed on losartan 50 mg daily and HCTZ 12.5 mg daily. At last visit, we identified that she was taking HCTZ QAM and losartan QAM. Recommended she switch d/t nocturia o Patient confirms she switched, endorses reduced nocturia  o Reports that she bought a new BP cuff, SBP always <140, generally in 120-130s  Pharmacist Clinical Goal(s):  Marland Kitchen Over the next 90 days, patient will work with PharmD and primary care provider to address needs related to optimized management of hypertension  Interventions: . Congratulated patient on optimized regimen and improved monitoring. Encouraged to bring home BP results to upcoming appointment with Marnee Guarneri, NP  Patient Self Care Activities:  . Self administers medications as prescribed . Will continue checking BP occasionally and documenting  Please see past updates related to this goal by clicking on the "Past Updates" button in the selected goal      .  "I want to work on my diabetes" (pt-stated)      [place] (see longtitudinal plan of care for additional care plan information)  Current Barriers:  . Diabetes: uncontrolled; complicated by chronic medical conditions including HTN, CAD, most recent A1c 7.2% . Most recent eGFR: ~87 mL/min . Current antihyperglycemic regimen: Ozempic 1 mg weekly, Jardiance 25 mg daily  o APPROVED for [employer] assistance through [employer] through 11/06/20 o APPROVED for [employer] assistance through [place] through 12/07/20 . Current meal patterns: o Lives by  herself, cooks a big meal on Sundays for her and her 67 YO mother.  o Breakfast: cereal (honey nut cheerios) or a biscuit (sausage, egg, and cheese);  o Lunch: usually eats leftovers (roast, chicken, vegetables); sometimes sandwich; lunch sometimes the biggest meal of the day  o Supper: sometimes hamburger helper meal, usually something quick;  o Drinks: water; diet pepsi in the evening o Snacks: salty snacks, chips, occasionally sweets; does tend to purchase the individual sizes at [facility] . Current exercise: hasn't been as active lately; found some walking tracks that she would like to walk more; does have a knee problem; has a membership at [employer] but is not comfortable going back yet . Current blood glucose readings:  o Has not been checking recently at home;  . Cardiovascular risk reduction: o Current hypertensive regimen: HCTZ 12.5 mg, losartan 100 mg; last BP in office at goal <130/80 o Current hyperlipidemia regimen: pravastatin 40 mg daily; LDL NOT at goal <70 o Current antiplatelet regimen: ASA 81 mg daily . GI concerns: notes her bowels move between diarrhea and constipation; recommended to get colonoscopy but patient has declined  Pharmacist Clinical Goal(s):  Marland Kitchen Over the next 90 days, patient will work with PharmD and primary care provider to address optimized medication management  Interventions: . Comprehensive medication review performed, medication list updated in electronic medical record . Inter-disciplinary care team collaboration (see longitudinal plan of care) . Reviewed goal A1c, goal fasting, and goal 2 hour post prandial glucoses.  . Reviewed that her current medications are at maximal doses. Encouraged diet modification to reduce carbohydrate services. Extensive discussion of the benefits of exercise - patient notes she would like to set a goal of walking 3 days per week. Provided encouragement. Discussed having an accountability partner.  . Reviewed benefits of  checking BG at home to determine control. Reviewed benefits of goal A1c <7%. Patient reports that she doesn't think she will ever "get my sugars as low as Jolene wants". Provided encouragement.  . Consider changing pravastatin to high intensity statin to target goal LDL <70 given DM and risk factors of HTN . Encouraged to consider colonoscopy to evaluate possible causes of bowel distress.   Patient Self Care Activities:  . Patient will check blood glucose daily, document, and provide at future appointments . Patient will take medications as prescribed . Patient will report any questions or concerns to provider   Please see past updates related to this goal by clicking on the "Past Updates" button in the selected goal      .  Patient Stated      11/09/2020, wants to get back to exercising      Depression Screen PHQ 2/9 Scores 11/09/2020 05/29/2020 05/04/2019 01/22/2018 01/12/2017 12/11/2015  PHQ - 2 Score 0 0 0 0 0 0  PHQ- 9 Score - - 0 0 0 -    Fall Risk Fall Risk  11/09/2020 05/29/2020 05/04/2019 02/03/2019 01/22/2018  Falls in the past year? 0 0 0 1 No  Number falls in past yr: - 0 - 0 -  Injury with Fall? - 0 - 0 -  Risk for fall due to : Medication side effect - - History of fall(s) -  Follow up Falls evaluation completed;Education provided;Falls prevention discussed Falls evaluation completed Falls evaluation completed Falls evaluation completed -    Any stairs in or around the home? Yes  If so, are there any without handrails? No  Home free of loose throw rugs in walkways, pet beds, electrical cords, etc? Yes  Adequate lighting in your home to reduce risk of falls? Yes   ASSISTIVE DEVICES UTILIZED TO PREVENT FALLS:  Life alert?  No  Use of a cane, walker or w/c? No  Grab bars in the bathroom? No  Shower chair or bench in shower? No  Elevated toilet seat or a handicapped toilet? No   TIMED UP AND GO:  Was the test performed? No .     Cognitive Function:     6CIT Screen  11/09/2020  What Year? 0 points  What month? 0 points  What time? 0 points  Count back from 20 0 points  Months in reverse 0 points  Repeat phrase 2 points  Total Score 2    Immunizations Immunization History  Administered Date(s) Administered  . Influenza,inj,Quad PF,6+ Mos 09/04/2015, 10/03/2016, 01/22/2018, 08/31/2018, 09/09/2019  . Influenza-Unspecified 09/01/2014  . PFIZER SARS-COV-2 Vaccination 01/31/2020, 02/21/2020  . Pneumococcal Conjugate-13 05/29/2020  . Pneumococcal Polysaccharide-23 05/12/2011  . Pneumococcal-Unspecified 05/12/2011  . Td 10/10/2008, 01/22/2018  . Zoster 12/11/2015    TDAP status: Up to date Flu Vaccine status: will be getting Pneumococcal vaccine status: Up to date Covid-19 vaccine status: Completed vaccines  Qualifies for Shingles Vaccine? Yes   Zostavax completed Yes   Shingrix Completed?: No.    Education has been provided regarding the importance of this vaccine. Patient has been advised to call insurance company to determine out of pocket expense if they have not yet received this vaccine. Advised may also receive vaccine at local pharmacy or Health Dept. Verbalized acceptance and understanding.  Screening Tests Health Maintenance  Topic Date Due  . OPHTHALMOLOGY EXAM  04/08/2019  . DEXA SCAN  Never done  . INFLUENZA VACCINE  07/08/2020  . COLONOSCOPY  05/29/2021 (Originally 02/06/2018)  . HEMOGLOBIN A1C  11/28/2020  . FOOT EXAM  02/26/2021  . PNA vac Low Risk Adult (2 of 2 - PPSV23) 05/29/2021  . MAMMOGRAM  08/24/2022  . PAP SMEAR-Modifier  05/30/2023  . TETANUS/TDAP  01/23/2028  . COVID-19 Vaccine  Completed  . Hepatitis C Screening  Completed  . HIV Screening  Completed    Health Maintenance  Health Maintenance Due  Topic Date Due  . OPHTHALMOLOGY EXAM  04/08/2019  . DEXA SCAN  Never done  . INFLUENZA VACCINE  07/08/2020    Colonoscopy: decline Mammogram status: Completed 08/24/2020. Repeat every year Bone Density status:  Completed 09/15/2012.   Lung Cancer Screening: (Low Dose CT Chest recommended if Age 47-80 years, 30 pack-year currently smoking OR have quit w/in 15years.) does not qualify.   Lung Cancer Screening Referral: no  Additional Screening:  Hepatitis C Screening: does qualify; Completed 12/11/2015  Vision Screening: Recommended annual ophthalmology exams for early detection of glaucoma and other disorders of the eye. Is the patient up to date with their annual eye exam?  Yes  Who is the provider or what is the name of the office in which the patient attends annual eye exams? Dr. Wyatt Portela If pt is not established with a provider, would they like to be referred to a provider to establish care? No .   Dental Screening: Recommended annual dental exams for proper oral hygiene  Community Resource Referral / Chronic Care Management: CRR required this visit?  No   CCM required this visit?  No      Plan:     I have personally reviewed and noted the following in the patient's chart:   . Medical and social history . Use of alcohol, tobacco or illicit drugs  . Current medications and supplements . Functional ability and status . Nutritional status . Physical activity . Advanced directives .  List of other physicians . Hospitalizations, surgeries, and ER visits in previous 12 months . Vitals . Screenings to include cognitive, depression, and falls . Referrals and appointments  In addition, I have reviewed and discussed with patient certain preventive protocols, quality metrics, and best practice recommendations. A written personalized care plan for preventive services as well as general preventive health recommendations were provided to patient.     Kellie Simmering, LPN   [id number]   Nurse Notes:

## 2020-11-09 NOTE — Patient Instructions (Signed)
[name] , Thank you for taking time to come for your Medicare Wellness Visit. I appreciate your ongoing commitment to your health goals. Please review the following plan we discussed and let me know if I can assist you in the future.   Screening recommendations/referrals: Colonoscopy: decline Mammogram: completed 08/24/2020, due 08/24/2021 Bone Density: ordered 05/2020 Recommended yearly ophthalmology/optometry visit for glaucoma screening and checkup Recommended yearly dental visit for hygiene and checkup  Vaccinations: Influenza vaccine: will be getting Pneumococcal vaccine: completed 05/29/2020 Tdap vaccine: completed 01/22/2018, due 01/23/2028 Shingles vaccine: discussed   Covid-19: 02/21/2020, 01/31/2020  Advanced directives: Advance directive discussed with you today.    Conditions/risks identified: none  Next appointment: Follow up in one year for your annual wellness visit    Preventive Care 65 Years and Older, Female Preventive care refers to lifestyle choices and visits with your health care provider that can promote health and wellness. What does preventive care include?  A yearly physical exam. This is also called an annual well check.  Dental exams once or twice a year.  Routine eye exams. Ask your health care provider how often you should have your eyes checked.  Personal lifestyle choices, including:  Daily care of your teeth and gums.  Regular physical activity.  Eating a healthy diet.  Avoiding tobacco and drug use.  Limiting alcohol use.  Practicing safe sex.  Taking low-dose aspirin every day.  Taking vitamin and mineral supplements as recommended by your health care provider. What happens during an annual well check? The services and screenings done by your health care provider during your annual well check will depend on your age, overall health, lifestyle risk factors, and family history of disease. Counseling  Your health care provider may ask  you questions about your:  Alcohol use.  Tobacco use.  Drug use.  Emotional well-being.  Home and relationship well-being.  Sexual activity.  Eating habits.  History of falls.  Memory and ability to understand (cognition).  Work and work Statistician.  Reproductive health. Screening  You may have the following tests or measurements:  Height, weight, and BMI.  Blood pressure.  Lipid and cholesterol levels. These may be checked every 5 years, or more frequently if you are over 32 years old.  Skin check.  Lung cancer screening. You may have this screening every year starting at age 24 if you have a 30-pack-year history of smoking and currently smoke or have quit within the past 15 years.  Fecal occult blood test (FOBT) of the stool. You may have this test every year starting at age 67.  Flexible sigmoidoscopy or colonoscopy. You may have a sigmoidoscopy every 5 years or a colonoscopy every 10 years starting at age 30.  Hepatitis C blood test.  Hepatitis B blood test.  Sexually transmitted disease (STD) testing.  Diabetes screening. This is done by checking your blood sugar (glucose) after you have not eaten for a while (fasting). You may have this done every 1-3 years.  Bone density scan. This is done to screen for osteoporosis. You may have this done starting at age 18.  Mammogram. This may be done every 1-2 years. Talk to your health care provider about how often you should have regular mammograms. Talk with your health care provider about your test results, treatment options, and if necessary, the need for more tests. Vaccines  Your health care provider may recommend certain vaccines, such as:  Influenza vaccine. This is recommended every year.  Tetanus, diphtheria, and acellular  pertussis (Tdap, Td) vaccine. You may need a Td booster every 10 years.  Zoster vaccine. You may need this after age 61.  Pneumococcal 13-valent conjugate (PCV13) vaccine. One dose  is recommended after age 80.  Pneumococcal polysaccharide (PPSV23) vaccine. One dose is recommended after age 40. Talk to your health care provider about which screenings and vaccines you need and how often you need them. This information is not intended to replace advice given to you by your health care provider. Make sure you discuss any questions you have with your health care provider. Document Released: 12/21/2015 Document Revised: 08/13/2016 Document Reviewed: 09/25/2015 Elsevier Interactive Patient Education  2017 [place] Prevention in the Home Falls can cause injuries. They can happen to people of all ages. There are many things you can do to make your home safe and to help prevent falls. What can I do on the outside of my home?  Regularly fix the edges of walkways and driveways and fix any cracks.  Remove anything that might make you trip as you walk through a door, such as a raised step or threshold.  Trim any bushes or trees on the path to your home.  Use bright outdoor lighting.  Clear any walking paths of anything that might make someone trip, such as rocks or tools.  Regularly check to see if handrails are loose or broken. Make sure that both sides of any steps have handrails.  Any raised decks and porches should have guardrails on the edges.  Have any leaves, snow, or ice cleared regularly.  Use sand or salt on walking paths during winter.  Clean up any spills in your garage right away. This includes oil or grease spills. What can I do in the bathroom?  Use night lights.  Install grab bars by the toilet and in the tub and shower. Do not use towel bars as grab bars.  Use non-skid mats or decals in the tub or shower.  If you need to sit down in the shower, use a plastic, non-slip stool.  Keep the floor dry. Clean up any water that spills on the floor as soon as it happens.  Remove soap buildup in the tub or shower regularly.  Attach bath mats  securely with double-sided non-slip rug tape.  Do not have throw rugs and other things on the floor that can make you trip. What can I do in the bedroom?  Use night lights.  Make sure that you have a light by your bed that is easy to reach.  Do not use any sheets or blankets that are too big for your bed. They should not hang down onto the floor.  Have a firm chair that has side arms. You can use this for support while you get dressed.  Do not have throw rugs and other things on the floor that can make you trip. What can I do in the kitchen?  Clean up any spills right away.  Avoid walking on wet floors.  Keep items that you use a lot in easy-to-reach places.  If you need to reach something above you, use a strong step stool that has a grab bar.  Keep electrical cords out of the way.  Do not use floor polish or wax that makes floors slippery. If you must use wax, use non-skid floor wax.  Do not have throw rugs and other things on the floor that can make you trip. What can I do with my stairs?  Do not leave any items on the stairs.  Make sure that there are handrails on both sides of the stairs and use them. Fix handrails that are broken or loose. Make sure that handrails are as long as the stairways.  Check any carpeting to make sure that it is firmly attached to the stairs. Fix any carpet that is loose or worn.  Avoid having throw rugs at the top or bottom of the stairs. If you do have throw rugs, attach them to the floor with carpet tape.  Make sure that you have a light switch at the top of the stairs and the bottom of the stairs. If you do not have them, ask someone to add them for you. What else can I do to help prevent falls?  Wear shoes that:  Do not have high heels.  Have rubber bottoms.  Are comfortable and fit you well.  Are closed at the toe. Do not wear sandals.  If you use a stepladder:  Make sure that it is fully opened. Do not climb a closed  stepladder.  Make sure that both sides of the stepladder are locked into place.  Ask someone to hold it for you, if possible.  Clearly mark and make sure that you can see:  Any grab bars or handrails.  First and last steps.  Where the edge of each step is.  Use tools that help you move around (mobility aids) if they are needed. These include:  Canes.  Walkers.  Scooters.  Crutches.  Turn on the lights when you go into a dark area. Replace any light bulbs as soon as they burn out.  Set up your furniture so you have a clear path. Avoid moving your furniture around.  If any of your floors are uneven, fix them.  If there are any pets around you, be aware of where they are.  Review your medicines with your doctor. Some medicines can make you feel dizzy. This can increase your chance of falling. Ask your doctor what other things that you can do to help prevent falls. This information is not intended to replace advice given to you by your health care provider. Make sure you discuss any questions you have with your health care provider. Document Released: 09/20/2009 Document Revised: 05/01/2016 Document Reviewed: 12/29/2014 Elsevier Interactive Patient Education  2017 [employer].

## 2020-11-26 ENCOUNTER — Telehealth: Payer: Self-pay | Admitting: Pharmacist

## 2020-11-26 NOTE — Chronic Care Management (AMB) (Signed)
11/26/20- CPA attempted to outreach the patient regarding patient assistance application ready to be signed at PCP's office and provide income documentation. No answer; left a HIPAA compliant voicemail.  Raynelle Highland, [place] Assistant [phone]

## 2020-12-21 ENCOUNTER — Other Ambulatory Visit: Payer: Self-pay | Admitting: Nurse Practitioner

## 2020-12-21 MED ORDER — OZEMPIC (1 MG/DOSE) 2 MG/1.5ML ~~LOC~~ SOPN: 1.0000 mg | PEN_INJECTOR | SUBCUTANEOUS | 0 refills | Status: DC

## 2020-12-25 ENCOUNTER — Telehealth: Payer: Self-pay | Admitting: Pharmacist

## 2020-12-25 NOTE — Telephone Encounter (Signed)
Patient given Novo-Nordisk emergency voucher codes to be presented to her pharmacy for a one month supply of Ozempic to avoid interruption of therapy while awaiting shipment.  Patient voucher: BIN: [id number] PCN: [id number] Group: [id number] Member: [id number]

## 2021-01-09 ENCOUNTER — Telehealth: Payer: Self-pay

## 2021-01-09 NOTE — Telephone Encounter (Signed)
4 boxes of Ozempic received for the patient. Called and notified her that it was ready to be picked up.  

## 2021-01-09 NOTE — Telephone Encounter (Signed)
Medication picked up by the patient.  

## 2021-02-01 ENCOUNTER — Ambulatory Visit (INDEPENDENT_AMBULATORY_CARE_PROVIDER_SITE_OTHER): Payer: Medicare Other | Admitting: Pharmacist

## 2021-02-01 DIAGNOSIS — I152 Hypertension secondary to endocrine disorders: Secondary | ICD-10-CM

## 2021-02-01 DIAGNOSIS — E1165 Type 2 diabetes mellitus with hyperglycemia: Secondary | ICD-10-CM

## 2021-02-01 DIAGNOSIS — E1159 Type 2 diabetes mellitus with other circulatory complications: Secondary | ICD-10-CM | POA: Diagnosis not present

## 2021-02-01 NOTE — Patient Instructions (Addendum)
Visit Information  It was a pleasure speaking with you today! Thank you for letting me be a part of your care team. Please call with any questions or concerns.  Goals Addressed            This Visit's Progress   . Achieve a Healthy Weight-Obesity       Timeframe:  Long-Range Goal Priority:  High Start Date:                             Expected End Date:                       Follow Up Date 3 month follow up    - drink 6 to 8 glasses of water each day - eat 3 to 5 servings of fruits and vegetables each day    Why is this important?    When you are ready to manage your weight, have a plan and have set a goal, it is time to take action.   Taking small steps to change how you eat and exercise is a good place to start.    Notes:     Marland Kitchen Monitor and Manage My Blood Sugar-Diabetes Type 2       Timeframe:  Long-Range Goal Priority:  High Start Date:                             Expected End Date:                       Follow Up Date 3 month follow up    - check blood sugar at prescribed times - enter blood sugar readings and medication or insulin into daily log - take the blood sugar log to all doctor visits    Why is this important?    Checking your blood sugar at home helps to keep it from getting very high or very low.   Writing the results in a diary or log helps the doctor know how to care for you.   Your blood sugar log should have the time, date and the results.   Also, write down the amount of insulin or other medicine that you take.   Other information, like what you ate, exercise done and how you were feeling, will also be helpful.     Notes:     . Obtain Eye Exam-Diabetes Type 2       Follow Up Date 3 month follow up   - schedule appointment with eye doctor    Why is this important?    Eye check-ups are important when you have diabetes.   Vision loss can be prevented.    Notes:     . Track and Manage My Blood Pressure-Hypertension       Timeframe:   Long-Range Goal Priority:  High Start Date:                             Expected End Date:                       Follow Up Date 3 month follow up    - check blood pressure 3 times per week - check blood pressure weekly - write blood pressure results in a log or diary  Why is this important?    You won't feel high blood pressure, but it can still hurt your blood vessels.   High blood pressure can cause heart or kidney problems. It can also cause a stroke.   Making lifestyle changes like losing a little weight or eating less salt will help.   Checking your blood pressure at home and at different times of the day can help to control blood pressure.   If the doctor prescribes medicine remember to take it the way the doctor ordered.   Call the office if you cannot afford the medicine or if there are questions about it.     Notes:        Ms. Stimmel was given information about Chronic Care Management services today including:  1. CCM service includes personalized support from designated clinical staff supervised by her physician, including individualized plan of care and coordination with other care providers 2. 24/7 contact phone numbers for assistance for urgent and routine care needs. 3. Standard insurance, coinsurance, copays and deductibles apply for chronic care management only during months in which we provide at least 20 minutes of these services. Most insurances cover these services at 100%, however patients may be responsible for any copay, coinsurance and/or deductible if applicable. This service may help you avoid the need for more expensive face-to-face services. 4. Only one practitioner may furnish and bill the service in a calendar month. 5. The patient may stop CCM services at any time (effective at the end of the month) by phone call to the office staff.  Patient agreed to services and verbal consent obtained.   The patient verbalized understanding of instructions,  educational materials, and care plan provided today and agreed to receive a mailed copy of patient instructions, educational materials, and care plan.  Telephone follow up appointment with pharmacy team member scheduled for:3 months  Junita Push. Kavan Devan PharmD, BCPS Clinical Pharmacist [phone]  Managing Your Hypertension Hypertension, also called high blood pressure, is when the force of the blood pressing against the walls of the arteries is too strong. Arteries are blood vessels that carry blood from your heart throughout your body. Hypertension forces the heart to work harder to pump blood and may cause the arteries to become narrow or stiff. Understanding blood pressure readings Your personal target blood pressure may vary depending on your medical conditions, your age, and other factors. A blood pressure reading includes a higher number over a lower number. Ideally, your blood pressure should be below 120/80. You should know that:  The first, or top, number is called the systolic pressure. It is a measure of the pressure in your arteries as your heart beats.  The second, or bottom number, is called the diastolic pressure. It is a measure of the pressure in your arteries as the heart relaxes. Blood pressure is classified into four stages. Based on your blood pressure reading, your health care provider may use the following stages to determine what type of treatment you need, if any. Systolic pressure and diastolic pressure are measured in a unit called mmHg. Normal  Systolic pressure: below 353.  Diastolic pressure: below 80. Elevated  Systolic pressure: 614-431.  Diastolic pressure: below 80. Hypertension stage 1  Systolic pressure: 540-086.  Diastolic pressure: 76-19. Hypertension stage 2  Systolic pressure: 509 or above.  Diastolic pressure: 90 or above. How can this condition affect me? Managing your hypertension is an important responsibility. Over time, hypertension  can damage the arteries and decrease blood  flow to important parts of the body, including the brain, heart, and kidneys. Having untreated or uncontrolled hypertension can lead to:  A heart attack.  A stroke.  A weakened blood vessel (aneurysm).  Heart failure.  Kidney damage.  Eye damage.  Metabolic syndrome.  Memory and concentration problems.  Vascular dementia. What actions can I take to manage this condition? Hypertension can be managed by making lifestyle changes and possibly by taking medicines. Your health care provider will help you make a plan to bring your blood pressure within a normal range. Nutrition  Eat a diet that is high in fiber and potassium, and low in salt (sodium), added sugar, and fat. An example eating plan is called the Dietary Approaches to Stop Hypertension (DASH) diet. To eat this way: ? Eat plenty of fresh fruits and vegetables. Try to fill one-half of your plate at each meal with fruits and vegetables. ? Eat whole grains, such as whole-wheat pasta, brown rice, or whole-grain bread. Fill about one-fourth of your plate with whole grains. ? Eat low-fat dairy products. ? Avoid fatty cuts of meat, processed or cured meats, and poultry with skin. Fill about one-fourth of your plate with lean proteins such as fish, chicken without skin, beans, eggs, and tofu. ? Avoid pre-made and processed foods. These tend to be higher in sodium, added sugar, and fat.  Reduce your daily sodium intake. Most people with hypertension should eat less than 1,500 mg of sodium a day.   Lifestyle  Work with your health care provider to maintain a healthy body weight or to lose weight. Ask what an ideal weight is for you.  Get at least 30 minutes of exercise that causes your heart to beat faster (aerobic exercise) most days of the week. Activities may include walking, swimming, or biking.  Include exercise to strengthen your muscles (resistance exercise), such as weight lifting, as  part of your weekly exercise routine. Try to do these types of exercises for 30 minutes at least 3 days a week.  Do not use any products that contain nicotine or tobacco, such as cigarettes, e-cigarettes, and chewing tobacco. If you need help quitting, ask your health care provider.  Control any long-term (chronic) conditions you have, such as high cholesterol or diabetes.  Identify your sources of stress and find ways to manage stress. This may include meditation, deep breathing, or making time for fun activities.   Alcohol use  Do not drink alcohol if: ? Your health care provider tells you not to drink. ? You are pregnant, may be pregnant, or are planning to become pregnant.  If you drink alcohol: ? Limit how much you use to:  0-1 drink a day for women.  0-2 drinks a day for men. ? Be aware of how much alcohol is in your drink. In the [place] one drink equals one 12 oz bottle of beer (355 mL), one 5 oz glass of wine (148 mL), or one 1 oz glass of hard liquor (44 mL). Medicines Your health care provider may prescribe medicine if lifestyle changes are not enough to get your blood pressure under control and if:  Your systolic blood pressure is 130 or higher.  Your diastolic blood pressure is 80 or higher. Take medicines only as told by your health care provider. Follow the directions carefully. Blood pressure medicines must be taken as told by your health care provider. The medicine does not work as well when you skip doses. Skipping doses also puts you at  risk for problems. Monitoring Before you monitor your blood pressure:  Do not smoke, drink caffeinated beverages, or exercise within 30 minutes before taking a measurement.  Use the bathroom and empty your bladder (urinate).  Sit quietly for at least 5 minutes before taking measurements. Monitor your blood pressure at home as told by your health care provider. To do this:  Sit with your back straight and supported.  Place your  feet flat on the floor. Do not cross your legs.  Support your arm on a flat surface, such as a table. Make sure your upper arm is at heart level.  Each time you measure, take two or three readings one minute apart and record the results. You may also need to have your blood pressure checked regularly by your health care provider.   General information  Talk with your health care provider about your diet, exercise habits, and other lifestyle factors that may be contributing to hypertension.  Review all the medicines you take with your health care provider because there may be side effects or interactions.  Keep all visits as told by your health care provider. Your health care provider can help you create and adjust your plan for managing your high blood pressure. Where to find more information  National Heart, Lung, and Blood Institute: https://wilson-eaton.com/  American Heart Association: www.heart.org Contact a health care provider if:  You think you are having a reaction to medicines you have taken.  You have repeated (recurrent) headaches.  You feel dizzy.  You have swelling in your ankles.  You have trouble with your vision. Get help right away if:  You develop a severe headache or confusion.  You have unusual weakness or numbness, or you feel faint.  You have severe pain in your chest or abdomen.  You vomit repeatedly.  You have trouble breathing. These symptoms may represent a serious problem that is an emergency. Do not wait to see if the symptoms will go away. Get medical help right away. Call your local emergency services (911 in the [place] Do not drive yourself to the hospital. Summary  Hypertension is when the force of blood pumping through your arteries is too strong. If this condition is not controlled, it may put you at risk for serious complications.  Your personal target blood pressure may vary depending on your medical conditions, your age, and other factors.  For most people, a normal blood pressure is less than 120/80.  Hypertension is managed by lifestyle changes, medicines, or both.  Lifestyle changes to help manage hypertension include losing weight, eating a healthy, low-sodium diet, exercising more, stopping smoking, and limiting alcohol. This information is not intended to replace advice given to you by your health care provider. Make sure you discuss any questions you have with your health care provider. Document Revised: 12/30/2019 Document Reviewed: 10/25/2019 Elsevier Patient Education  2021 [employer].

## 2021-02-01 NOTE — Progress Notes (Signed)
Chronic Care Management Pharmacy Note  02/01/2021 Name:  Christine Booth MRN:  [id number] DOB:  April 05, 1956  Subjective: Christine Booth is an 65 y.o. year old female who is a primary patient of Cannady, Barbaraann Faster, NP.  The CCM team was consulted for assistance with disease management and care coordination needs.    Engaged with patient by telephone for initial visit in response to provider referral for pharmacy case management and/or care coordination services.   Consent to Services:  The patient was given information about Chronic Care Management services, agreed to services, and gave verbal consent prior to initiation of services.  Please see initial visit note for detailed documentation.   Patient Care Team: [name], NP as PCP - General (Nurse Practitioner)  Recent office visits: 05/29/20- Marnee Guarneri , DNP- blood work , PAP smear, change to Crestor 40 mg 04/13/20- Catie Darnelle Maffucci, [place] [facility] visits: None in previous 6 months  Objective:  Wt Readings from Last 3 Encounters:  11/09/20 160 lb (72.6 kg)  05/29/20 165 lb 9.6 oz (75.1 kg)  11/09/19 162 lb (73.5 kg)      Lab Results  Component Value Date   CREATININE 0.72 05/29/2020   BUN 15 05/29/2020   GFRNONAA 88 05/29/2020   GFRAA 102 05/29/2020   NA 138 05/29/2020   K 3.8 05/29/2020   CALCIUM 9.6 05/29/2020   CO2 22 05/29/2020    Lab Results  Component Value Date/Time   HGBA1C 7.3 (H) 05/29/2020 09:33 AM   HGBA1C 7.2 (H) 02/27/2020 08:43 AM   MICROALBUR 10 02/27/2020 08:43 AM   MICROALBUR 10 05/04/2019 08:39 AM    Last diabetic Eye exam: No results found for: HMDIABEYEEXA  Last diabetic Foot exam: No results found for: HMDIABFOOTEX   Lab Results  Component Value Date   CHOL 158 05/29/2020   HDL 48 05/29/2020   LDLCALC 83 05/29/2020   TRIG 158 (H) 05/29/2020    Hepatic Function Latest Ref Rng & Units 05/29/2020 11/17/2019 05/04/2019  Total Protein 6.0 - 8.5 g/dL 6.4 6.2 6.7   Albumin 3.8 - 4.8 g/dL 4.7 4.5 4.8  AST 0 - 40 IU/L 17 18 17   ALT 0 - 32 IU/L 17 17 16   Alk Phosphatase 48 - 121 IU/L 66 65 67  Total Bilirubin 0.0 - 1.2 mg/dL 0.5 0.5 0.5    Lab Results  Component Value Date/Time   TSH 1.600 05/29/2020 09:35 AM   TSH 1.680 05/04/2019 08:42 AM    CBC Latest Ref Rng & Units 05/29/2020 05/04/2019 03/29/2018  WBC 3.4 - 10.8 x10E3/uL 6.5 CANCELED 10.1  Hemoglobin 11.1 - 15.9 g/dL 15.2 - 14.7  Hematocrit 34.0 - 46.6 % 44.6 - 41.2  Platelets 150 - 450 x10E3/uL 259 - 239    No results found for: VD25OH  Clinical ASCVD: No  The ASCVD Risk score Mikey Bussing DC Jr., et al., 2013) failed to calculate for the following reasons:   The systolic blood pressure is missing    Depression screen [facility] 2/9 11/09/2020 05/29/2020 05/04/2019  Decreased Interest 0 0 0  Down, Depressed, Hopeless 0 0 0  PHQ - 2 Score 0 0 0  Altered sleeping - - 0  Tired, decreased energy - - 0  Change in appetite - - 0  Feeling bad or failure about yourself  - - 0  Trouble concentrating - - 0  Moving slowly or fidgety/restless - - 0  Suicidal thoughts - - 0  PHQ-9 Score - -  0      Social History   Tobacco Use  Smoking Status Never Smoker  Smokeless Tobacco Never Used   BP Readings from Last 3 Encounters:  05/29/20 102/69  02/27/20 110/70  11/09/19 127/68   Pulse Readings from Last 3 Encounters:  05/29/20 83  02/27/20 81  11/09/19 68   Wt Readings from Last 3 Encounters:  11/09/20 160 lb (72.6 kg)  05/29/20 165 lb 9.6 oz (75.1 kg)  11/09/19 162 lb (73.5 kg)    Assessment/Interventions: Review of patient past medical history, allergies, medications, health status, including review of consultants reports, laboratory and other test data, was performed as part of comprehensive evaluation and provision of chronic care management services.   SDOH:  (Social Determinants of Health) assessments and interventions performed: YES SDOH Interventions   Flowsheet Row Most Recent Value   SDOH Interventions   Financial Strain Interventions Other (Comment)  [patient assistance]       Immunization History  Administered Date(s) Administered  . Influenza,inj,Quad PF,6+ Mos 09/04/2015, 10/03/2016, 01/22/2018, 08/31/2018, 09/09/2019  . Influenza-Unspecified 09/01/2014  . PFIZER(Purple Top)SARS-COV-2 Vaccination 01/31/2020, 02/21/2020  . Pneumococcal Conjugate-13 05/29/2020  . Pneumococcal Polysaccharide-23 05/12/2011  . Pneumococcal-Unspecified 05/12/2011  . Td 10/10/2008, 01/22/2018  . Zoster 12/11/2015    Conditions to be addressed/monitored:  Hypertension, Diabetes, Coronary Artery Disease and GERD  Care Plan : [place]  Updates made by Vladimir Faster, [place] since 02/01/2021 12:00 AM    Problem: Diabetes, HTN, HLD,GERD, Obesity   Priority: High    Long-Range Goal: Disease Management   Start Date: 02/01/2021  This Visit's Progress: On track  Priority: High  Note:   Current Barriers:  . Unable to independently afford treatment regimen . Unable to independently monitor therapeutic efficacy . Unable to achieve control of diabetes  . Does not maintain contact with provider office  Pharmacist Clinical Goal(s):  Marland Kitchen Over the next 90 days, patient will verbalize ability to afford treatment regimen . achieve adherence to monitoring guidelines and medication adherence to achieve therapeutic efficacy . achieve control of diabetes as evidenced by A1c and SMBG values through collaboration with PharmD and provider.  .   Interventions: . 1:1 collaboration with [name], NP regarding development and update of comprehensive plan of care as evidenced by provider attestation and co-signature . Inter-disciplinary care team collaboration (see longitudinal plan of care) . Comprehensive medication review performed; medication list updated in electronic medical record BP Readings from Last 3 Encounters:  05/29/20 102/69  02/27/20 110/70  11/09/19 127/68     Hypertension (BP goal <130/80) -Controlled -Current treatment: . HCTZ 12.53mqd . Losartan 100 mg qd -Medications previously tried:NA -Current home readings: <130/80 Patient checks 1-2 times monthly -Denies hypotensive/hypertensive symptoms -Educated on BP goals and benefits of medications for prevention of heart attack, stroke and kidney damage; Daily salt intake goal < 2300 mg; Exercise goal of 150 minutes per week; -Counseled to monitor BP at home 2-3 times weekly, document, and provide log at future appointments -Counseled on diet and exercise extensively Recommended to continue current medication Lipid Panel     Component Value Date/Time   CHOL 158 05/29/2020 0935   CHOL 160 05/04/2019 0839   TRIG 158 (H) 05/29/2020 0935   TRIG 108 05/04/2019 0839   HDL 48 05/29/2020 0935   VLDL 22 05/04/2019 0839   LDLCALC 83 05/29/2020 0935   LABVLDL 27 05/29/2020 0935    Hyperlipidemia: (LDL goal < 70) -Not ideally controlled -Current treatment: .  Rosuvastatin 40 mg qd . Aspirin 81 mg qd -Medications previously tried: NA  -Educated on Cholesterol goals;  Benefits of statin for ASCVD risk reduction; Exercise goal of 150 minutes per week; -Counseled on diet and exercise extensively Recommended to continue current medication Recommended follow up lipid panel at next visit  Reviewed new guidelines for aspirin use.  Counseled on signs/symptoms of bleeding. Cautioned on use of NSAIDS (meloxicam) and aspirin.   Lab Results  Component Value Date/Time   HGBA1C 7.3 (H) 05/29/2020 09:33 AM   HGBA1C 7.2 (H) 02/27/2020 08:43 AM    Diabetes (A1c goal <7%) -Not ideally controlled -Current medications: . Ozempic 1 mg qd . Jardiance 25 mg qd -Medications previously tried: NA  -Current home glucose readings . fasting glucose: 160s . post prandial glucose: doesn't check -Denies hypoglycemic/hyperglycemic symptoms -Current meal patterns:  . breakfast: cereal , biscuit . lunch:  leftovers  . dinner: roast, chicken, hamburger helper . snacks: chips . drinks: water, diet pepsi -Current exercise: not exercising currently, still has [employer] membership. Moved her mother to an assisted living facility and has been dealing with that for a couple months. -Educated onA1c and blood sugar goals; Exercise goal of 150 minutes per week; Prevention and management of hypoglycemic episodes; Benefits of routine self-monitoring of blood sugar; -Counseled to check feet daily and get yearly eye exams -Counseled on diet and exercise extensively Recommended to continue current medication Assessed patient finances. PAP approved to Ozempic. Jardiance still pending.  Needs Eye exam. Overdue for office visit and A1c ( due in September)  Osteoporosis / Osteopenia (Goal prevent fracture) -Not ideally controlled -Last DEXA Scan: Never done    -Current treatment  . none -Medications previously tried: na -Recommend [phone] units of vitamin D daily. Recommend 1200 mg of calcium daily from dietary and supplemental sources. Recommend weight-bearing and muscle strengthening exercises for building and maintaining bone density. -Counseled on recommended intake of vitamin d and calcium.  Recommended DEXA scan and vitamin level Patient reports her DEXA was supposed to be performed the same day as her mammogram but the facility reported they did not have an order for a DEXA.  Health Maintenance -Vaccine gaps: None -Current therapy:  Multivitamin Miralax Senna- S Meloxicam 15 mg qd for knee pain  -Patient is satisfied with current therapy and denies issues -Recommended to continue current medication Educated on Vitamin D and calcium recommended intake   Patient Goals/Self-Care Activities . Over the next 90 days, patient will:  - take medications as prescribed check glucose twice daily, document, and provide at future appointments target a minimum of 150 minutes of moderate  intensity exercise weekly  Follow Up Plan: Telephone follow up appointment with care management team member scheduled for:          Medication Assistance: Ozempic obtained through [place] medication assistance program.  Enrollment ends 31/59/45  Jardiance application originally denied stating over income. Patient called BI and was able to get reinstated. She has received both medications.  Patient's preferred pharmacy is:  [id number] [place] [address] [id number]: [phone]: [phone] Uses pill box? Yes Pt endorses 90 % compliance  We discussed: Benefits of medication synchronization, packaging and delivery as well as enhanced pharmacist oversight with [employer]. Patient decided to: Continue current medication management strategy  Care Plan and Follow Up Patient Decision:  Patient agrees to Care Plan and Follow-up.  Plan: Telephone follow up appointment with care management  team member scheduled for:  3 months  Junita Push. Kenton Kingfisher PharmD, [facility] [phone]

## 2021-02-12 ENCOUNTER — Other Ambulatory Visit: Payer: Self-pay | Admitting: Nurse Practitioner

## 2021-02-15 ENCOUNTER — Other Ambulatory Visit: Payer: Self-pay | Admitting: Nurse Practitioner

## 2021-02-15 DIAGNOSIS — E1159 Type 2 diabetes mellitus with other circulatory complications: Secondary | ICD-10-CM

## 2021-02-15 DIAGNOSIS — I152 Hypertension secondary to endocrine disorders: Secondary | ICD-10-CM

## 2021-02-15 DIAGNOSIS — E1165 Type 2 diabetes mellitus with hyperglycemia: Secondary | ICD-10-CM

## 2021-02-15 NOTE — Telephone Encounter (Signed)
   Notes to clinic Pharmacy requesting changes to rx.

## 2021-03-01 ENCOUNTER — Encounter: Payer: Self-pay | Admitting: Nurse Practitioner

## 2021-03-01 ENCOUNTER — Other Ambulatory Visit: Payer: Self-pay

## 2021-03-01 ENCOUNTER — Ambulatory Visit (INDEPENDENT_AMBULATORY_CARE_PROVIDER_SITE_OTHER): Payer: Medicare Other | Admitting: Nurse Practitioner

## 2021-03-01 VITALS — BP 131/74 | HR 66 | Temp 98.4°F | Wt 160.6 lb

## 2021-03-01 DIAGNOSIS — E1165 Type 2 diabetes mellitus with hyperglycemia: Secondary | ICD-10-CM

## 2021-03-01 DIAGNOSIS — E1159 Type 2 diabetes mellitus with other circulatory complications: Secondary | ICD-10-CM

## 2021-03-01 DIAGNOSIS — E1169 Type 2 diabetes mellitus with other specified complication: Secondary | ICD-10-CM | POA: Diagnosis not present

## 2021-03-01 DIAGNOSIS — Z780 Asymptomatic menopausal state: Secondary | ICD-10-CM | POA: Diagnosis not present

## 2021-03-01 DIAGNOSIS — D123 Benign neoplasm of transverse colon: Secondary | ICD-10-CM | POA: Diagnosis not present

## 2021-03-01 DIAGNOSIS — I152 Hypertension secondary to endocrine disorders: Secondary | ICD-10-CM

## 2021-03-01 DIAGNOSIS — Z6830 Body mass index (BMI) 30.0-30.9, adult: Secondary | ICD-10-CM

## 2021-03-01 DIAGNOSIS — E785 Hyperlipidemia, unspecified: Secondary | ICD-10-CM

## 2021-03-01 DIAGNOSIS — E6609 Other obesity due to excess calories: Secondary | ICD-10-CM

## 2021-03-01 DIAGNOSIS — K219 Gastro-esophageal reflux disease without esophagitis: Secondary | ICD-10-CM

## 2021-03-01 LAB — MICROALBUMIN, URINE WAIVED
Creatinine, Urine Waived: 100 mg/dL (ref 10–300)
Microalb, Ur Waived: 30 mg/L — ABNORMAL HIGH (ref 0–19)
Microalb/Creat Ratio: <30 mg/g (ref <30)

## 2021-03-01 LAB — BAYER DCA HB A1C WAIVED: HB A1C (BAYER DCA - WAIVED): 8.4 % — ABNORMAL HIGH (ref <7.0)

## 2021-03-01 MED ORDER — MELOXICAM 15 MG PO TABS: ORAL_TABLET | ORAL | 2 refills | Status: DC

## 2021-03-01 NOTE — Assessment & Plan Note (Signed)
BMI 29.37.  Recommended eating smaller high protein, low fat meals more frequently and exercising 30 mins a day 5 times a week with a goal of 10-15lb weight loss in the next 3 months. Patient voiced their understanding and motivation to adhere to these recommendations.

## 2021-03-01 NOTE — Assessment & Plan Note (Signed)
Chronic, stable with BP below goal on home readings and in office today.  Continue current medication regimen and adjust as needed.  CMP today.  Continue to monitor BP at home regularly and focus on DASH diet + regular exercise.  Return in 3 months.

## 2021-03-01 NOTE — Progress Notes (Signed)
BP 131/74   Pulse 66   Temp 98.4 F (36.9 C) (Oral)   Wt 160 lb 9.6 oz (72.8 kg)   LMP  (LMP Unknown)   SpO2 98%   BMI 29.37 kg/m    Subjective:    Patient ID: Christine Booth, female    DOB: [birth year], 65 y.o.   MRN: [id number]  HPI: Christine Booth is a 65 y.o. female  Chief Complaint  Patient presents with  . Diabetes    Patient is here for a follow up visit. Patient denies having any questions or concerns at today's visit.   DIABETES Continues onJardiance 25 MG daily and Ozempic 1 MG weekly, she is working with CCM team on cost of medications. Last visit A1C 7.3% in June. Has been trying to walk, but knee is bothering her -- has been to ortho.    Did take Metformin in the beginning, caused some constipation.Does have underlying issues with constipation, has had issues for several years. Takes Senna-Docusate (takes this as needed). Last colonoscopy was in 2018, she was to repeat in one year but did not attend this. Reports fears about this, which PCP discussed at length with her importance of repeat screening to ensure no cancerous polyps present.  She reports wishes for no treatment if something were present. Educated her on colon CA.  Hypoglycemic episodes:no Polydipsia/polyuria:no Visual disturbance:no Chest pain:no Paresthesias:no Glucose Monitoring:yes Accucheck frequency:once every couple weeks Fasting glucose:160 - 165 in the morning Post prandial: Evening: Before meals: Taking Insulin?:no Long acting insulin: Short acting insulin: Blood Pressure Monitoring:daily Retinal Examination:Not up to Date -- scheduled for April Foot Exam:Up to Date Pneumovax:Up to Date Influenza:Up to Date Aspirin:yes  HYPERTENSION / HYPERLIPIDEMIA Continues on HCTZ 12.5 MG daily, Olmesartan 40 MG daily, and Rosuvastatin 40 MG daily.  Last LDL 83. Satisfied with  current treatment?yes Duration of hypertension:chronic BP monitoring frequency:once every two weeks BP range:110-120/70-80 range BP medication side effects:no Duration of hyperlipidemia:chronic Cholesterol medication side effects:no Cholesterol supplements: none Medication compliance:good compliance Aspirin:yes Recent stressors:no Recurrent headaches:no Visual changes:no Palpitations:no Dyspnea:no Chest pain:no Lower extremity edema:no Dizzy/lightheaded:no  GERD Continues on daily Protonix daily.  Last Mag level 2.3 in December 2020. GERD control status: stable  Satisfied with current treatment? yes Heartburn frequency: none Medication side effects: no  Medication compliance: stable Dysphagia: no Odynophagia:  no Hematemesis: no Blood in stool: no EGD: yes  Relevant past medical, surgical, family and social history reviewed and updated as indicated. Interim medical history since our last visit reviewed. Allergies and medications reviewed and updated.  Review of Systems  Constitutional: Negative for activity change, appetite change, diaphoresis, fatigue and fever.  Respiratory: Negative for cough, chest tightness and shortness of breath.   Cardiovascular: Negative for chest pain, palpitations and leg swelling.  Gastrointestinal: Negative.   Endocrine: Negative for cold intolerance, heat intolerance, polydipsia, polyphagia and polyuria.  Neurological: Negative.   Psychiatric/Behavioral: Negative.     Per HPI unless specifically indicated above     Objective:    BP 131/74   Pulse 66   Temp 98.4 F (36.9 C) (Oral)   Wt 160 lb 9.6 oz (72.8 kg)   LMP  (LMP Unknown)   SpO2 98%   BMI 29.37 kg/m   Wt Readings from Last 3 Encounters:  03/01/21 160 lb 9.6 oz (72.8 kg)  11/09/20 160 lb (72.6 kg)  05/29/20 165 lb 9.6 oz (75.1 kg)    Physical Exam Vitals and nursing note reviewed.  Constitutional:  General: She is awake. She is not in acute  distress.    Appearance: She is well-developed, well-groomed and overweight. She is not ill-appearing.  HENT:     Head: Normocephalic.     Right Ear: Hearing normal.     Left Ear: Hearing normal.  Eyes:     General: Lids are normal.        Right eye: No discharge.        Left eye: No discharge.     Conjunctiva/sclera: Conjunctivae normal.     Pupils: Pupils are equal, round, and reactive to light.  Neck:     Thyroid: No thyromegaly.     Vascular: No carotid bruit.  Cardiovascular:     Rate and Rhythm: Normal rate and regular rhythm.     Heart sounds: Normal heart sounds. No murmur heard. No gallop.   Pulmonary:     Effort: Pulmonary effort is normal. No accessory muscle usage or respiratory distress.     Breath sounds: Normal breath sounds.  Abdominal:     General: Bowel sounds are normal.     Palpations: Abdomen is soft.  Musculoskeletal:     Cervical back: Normal range of motion and neck supple.     Right lower leg: No edema.     Left lower leg: No edema.  Skin:    General: Skin is warm and dry.  Neurological:     Mental Status: She is alert and oriented to person, place, and time.  Psychiatric:        Attention and Perception: Attention normal.        Mood and Affect: Mood normal.        Behavior: Behavior normal. Behavior is cooperative.        Thought Content: Thought content normal.        Judgment: Judgment normal.    Diabetic Foot Exam - Simple   Simple Foot Form Visual Inspection No deformities, no ulcerations, no other skin breakdown bilaterally: Yes Sensation Testing Intact to touch and monofilament testing bilaterally: Yes Pulse Check Posterior Tibialis and Dorsalis pulse intact bilaterally: Yes Comments    Results for orders placed or performed in visit on 08/28/20  HM MAMMOGRAPHY  Result Value Ref Range   HM Mammogram 0-4 Bi-Rad 0-4 Bi-Rad, Self Reported Normal      Assessment & Plan:   Problem List Items Addressed This Visit       Cardiovascular and Mediastinum   Hypertension associated with diabetes ([place])    Chronic, stable with BP below goal on home readings and in office today.  Continue current medication regimen and adjust as needed.  CMP today.  Continue to monitor BP at home regularly and focus on DASH diet + regular exercise.  Return in 3 months.      Relevant Orders   Bayer DCA Hb A1c Waived   Microalbumin, Urine Waived   Comprehensive metabolic panel     Digestive   Benign neoplasm of transverse colon    Highly recommend she return for colonoscopy as was recommended by GI.  Discussed at length with her fears she has.  She refuses and reports if CA present she would not pursue treatment.  Discussed Cologuard option for screening, which she wishes to think about.      GERD (gastroesophageal reflux disease)    Chronic, stable at this time.  Obtain mag level yearly, will obtain next visit.  Continue current medication regimen and collaboration with GI team.  Endocrine   Hyperlipidemia associated with type 2 diabetes mellitus (HCC)    Chronic, ongoing.  Recent lipid panel with LDL 83, prior to change to Crestor.  Continue current medication regimen and adjust as needed.  Lipid panel today.      Relevant Orders   Bayer DCA Hb A1c Waived   Comprehensive metabolic panel   Lipid Panel w/o Chol/HDL Ratio   Uncontrolled type 2 diabetes mellitus ([place]) - Primary    Chronic, ongoing with A1C today 8.4% and urine ALB 30, continue ARB for kidney protection.  At this time continue Ozempic and Jardiance, she is at max dose of each.  Will reach out to CCM team PharmD to see if we can change to Trulicity via assistance, as patient can not afford without assistance -- if can then would start Trulicity 3 MG weekly and stop Ozempic.  If unable to do this change would consider sulfonylurea at very low dose, discussed with patient, due to lower cost.  Avoid Metformin as did not tolerate in past.  At this time she would  prefer not to add additional medication and would like to focus on diet and regular exercise.  Recommend continue to check BS daily and return to regular exercise and diet focus to maintain A1C <7.  Return in 3 months.      Relevant Orders   Bayer DCA Hb A1c Waived   Microalbumin, Urine Waived     Other   Obesity    BMI 29.37.  Recommended eating smaller high protein, low fat meals more frequently and exercising 30 mins a day 5 times a week with a goal of 10-15lb weight loss in the next 3 months. Patient voiced their understanding and motivation to adhere to these recommendations.        Other Visit Diagnoses    Postmenopausal estrogen deficiency       DEXA ordered   Relevant Orders   DG Bone Density       Follow up plan: Return in about 3 months (around 06/01/2021), or T2DM, HTN/HLD, GERD.

## 2021-03-01 NOTE — Assessment & Plan Note (Signed)
Chronic, ongoing with A1C today 8.4% and urine ALB 30, continue ARB for kidney protection.  At this time continue Ozempic and Jardiance, she is at max dose of each.  Will reach out to CCM team PharmD to see if we can change to Trulicity via assistance, as patient can not afford without assistance -- if can then would start Trulicity 3 MG weekly and stop Ozempic.  If unable to do this change would consider sulfonylurea at very low dose, discussed with patient, due to lower cost.  Avoid Metformin as did not tolerate in past.  At this time she would prefer not to add additional medication and would like to focus on diet and regular exercise.  Recommend continue to check BS daily and return to regular exercise and diet focus to maintain A1C <7.  Return in 3 months.

## 2021-03-01 NOTE — Assessment & Plan Note (Signed)
Highly recommend she return for colonoscopy as was recommended by GI.  Discussed at length with her fears she has.  She refuses and reports if CA present she would not pursue treatment.  Discussed Cologuard option for screening, which she wishes to think about.

## 2021-03-01 NOTE — Assessment & Plan Note (Signed)
Chronic, stable at this time.  Obtain mag level yearly, will obtain next visit.  Continue current medication regimen and collaboration with GI team.

## 2021-03-01 NOTE — Patient Instructions (Signed)
Diabetes Mellitus and Nutrition, Adult When you have diabetes, or diabetes mellitus, it is very important to have healthy eating habits because your blood sugar (glucose) levels are greatly affected by what you eat and drink. Eating healthy foods in the right amounts, at about the same times every day, can help you:  Control your blood glucose.  Lower your risk of heart disease.  Improve your blood pressure.  Reach or maintain a healthy weight. What can affect my meal plan? Every person with diabetes is different, and each person has different needs for a meal plan. Your health care provider may recommend that you work with a dietitian to make a meal plan that is best for you. Your meal plan may vary depending on factors such as:  The calories you need.  The medicines you take.  Your weight.  Your blood glucose, blood pressure, and cholesterol levels.  Your activity level.  Other health conditions you have, such as heart or kidney disease. How do carbohydrates affect me? Carbohydrates, also called carbs, affect your blood glucose level more than any other type of food. Eating carbs naturally raises the amount of glucose in your blood. Carb counting is a method for keeping track of how many carbs you eat. Counting carbs is important to keep your blood glucose at a healthy level, especially if you use insulin or take certain oral diabetes medicines. It is important to know how many carbs you can safely have in each meal. This is different for every person. Your dietitian can help you calculate how many carbs you should have at each meal and for each snack. How does alcohol affect me? Alcohol can cause a sudden decrease in blood glucose (hypoglycemia), especially if you use insulin or take certain oral diabetes medicines. Hypoglycemia can be a life-threatening condition. Symptoms of hypoglycemia, such as sleepiness, dizziness, and confusion, are similar to symptoms of having too much  alcohol.  Do not drink alcohol if: ? Your health care provider tells you not to drink. ? You are pregnant, may be pregnant, or are planning to become pregnant.  If you drink alcohol: ? Do not drink on an empty stomach. ? Limit how much you use to:  0-1 drink a day for women.  0-2 drinks a day for men. ? Be aware of how much alcohol is in your drink. In the [place] one drink equals one 12 oz bottle of beer (355 mL), one 5 oz glass of wine (148 mL), or one 1 oz glass of hard liquor (44 mL). ? Keep yourself hydrated with water, diet soda, or unsweetened iced tea.  Keep in mind that regular soda, juice, and other mixers may contain a lot of sugar and must be counted as carbs. What are tips for following this plan? Reading food labels  Start by checking the serving size on the "Nutrition Facts" label of packaged foods and drinks. The amount of calories, carbs, fats, and other nutrients listed on the label is based on one serving of the item. Many items contain more than one serving per package.  Check the total grams (g) of carbs in one serving. You can calculate the number of servings of carbs in one serving by dividing the total carbs by 15. For example, if a food has 30 g of total carbs per serving, it would be equal to 2 servings of carbs.  Check the number of grams (g) of saturated fats and trans fats in one serving. Choose foods that have   a low amount or none of these fats.  Check the number of milligrams (mg) of salt (sodium) in one serving. Most people should limit total sodium intake to less than 2,300 mg per day.  Always check the nutrition information of foods labeled as "low-fat" or "nonfat." These foods may be higher in added sugar or refined carbs and should be avoided.  Talk to your dietitian to identify your daily goals for nutrients listed on the label. Shopping  Avoid buying canned, pre-made, or processed foods. These foods tend to be high in fat, sodium, and added  sugar.  Shop around the outside edge of the grocery store. This is where you will most often find fresh fruits and vegetables, bulk grains, fresh meats, and fresh dairy. Cooking  Use low-heat cooking methods, such as baking, instead of high-heat cooking methods like deep frying.  Cook using healthy oils, such as olive, canola, or sunflower oil.  Avoid cooking with butter, cream, or high-fat meats. Meal planning  Eat meals and snacks regularly, preferably at the same times every day. Avoid going long periods of time without eating.  Eat foods that are high in fiber, such as fresh fruits, vegetables, beans, and whole grains. Talk with your dietitian about how many servings of carbs you can eat at each meal.  Eat 4-6 oz (112-168 g) of lean protein each day, such as lean meat, chicken, fish, eggs, or tofu. One ounce (oz) of lean protein is equal to: ? 1 oz (28 g) of meat, chicken, or fish. ? 1 egg. ?  cup (62 g) of tofu.  Eat some foods each day that contain healthy fats, such as avocado, nuts, seeds, and fish.   What foods should I eat? Fruits Berries. Apples. Oranges. Peaches. Apricots. Plums. Grapes. Mango. Papaya. Pomegranate. Kiwi. Cherries. Vegetables Lettuce. Spinach. Leafy greens, including kale, chard, collard greens, and mustard greens. Beets. Cauliflower. Cabbage. Broccoli. Carrots. Green beans. Tomatoes. Peppers. Onions. Cucumbers. Brussels sprouts. Grains Whole grains, such as whole-wheat or whole-grain bread, crackers, tortillas, cereal, and pasta. Unsweetened oatmeal. Quinoa. Brown or wild rice. Meats and other proteins Seafood. Poultry without skin. Lean cuts of poultry and beef. Tofu. Nuts. Seeds. Dairy Low-fat or fat-free dairy products such as milk, yogurt, and cheese. The items listed above may not be a complete list of foods and beverages you can eat. Contact a dietitian for more information. What foods should I avoid? Fruits Fruits canned with  syrup. Vegetables Canned vegetables. Frozen vegetables with butter or cream sauce. Grains Refined white flour and flour products such as bread, pasta, snack foods, and cereals. Avoid all processed foods. Meats and other proteins Fatty cuts of meat. Poultry with skin. Breaded or fried meats. Processed meat. Avoid saturated fats. Dairy Full-fat yogurt, cheese, or milk. Beverages Sweetened drinks, such as soda or iced tea. The items listed above may not be a complete list of foods and beverages you should avoid. Contact a dietitian for more information. Questions to ask a health care provider  Do I need to meet with a diabetes educator?  Do I need to meet with a dietitian?  What number can I call if I have questions?  When are the best times to check my blood glucose? Where to find more information:  American Diabetes Association: diabetes.org  Academy of Nutrition and Dietetics: www.eatright.org  National Institute of Diabetes and Digestive and Kidney Diseases: www.niddk.nih.gov  Association of Diabetes Care and Education Specialists: www.diabeteseducator.org Summary  It is important to have healthy eating   habits because your blood sugar (glucose) levels are greatly affected by what you eat and drink.  A healthy meal plan will help you control your blood glucose and maintain a healthy lifestyle.  Your health care provider may recommend that you work with a dietitian to make a meal plan that is best for you.  Keep in mind that carbohydrates (carbs) and alcohol have immediate effects on your blood glucose levels. It is important to count carbs and to use alcohol carefully. This information is not intended to replace advice given to you by your health care provider. Make sure you discuss any questions you have with your health care provider. Document Revised: 11/01/2019 Document Reviewed: 11/01/2019 Elsevier Patient Education  2021 Elsevier Inc.  

## 2021-03-01 NOTE — Assessment & Plan Note (Signed)
Chronic, ongoing.  Recent lipid panel with LDL 83, prior to change to Crestor.  Continue current medication regimen and adjust as needed.  Lipid panel today.

## 2021-03-02 LAB — COMPREHENSIVE METABOLIC PANEL
ALT: 14 IU/L (ref 0–32)
AST: 14 IU/L (ref 0–40)
Albumin/Globulin Ratio: 2.1 (ref 1.2–2.2)
Albumin: 4.5 g/dL (ref 3.8–4.8)
Alkaline Phosphatase: 66 IU/L (ref 44–121)
BUN/Creatinine Ratio: 19 (ref 12–28)
BUN: 13 mg/dL (ref 8–27)
Bilirubin Total: 0.5 mg/dL (ref 0.0–1.2)
CO2: 22 mmol/L (ref 20–29)
Calcium: 9.3 mg/dL (ref 8.7–10.3)
Chloride: 105 mmol/L (ref 96–106)
Creatinine, Ser: 0.69 mg/dL (ref 0.57–1.00)
Globulin, Total: 2.1 g/dL (ref 1.5–4.5)
Glucose: 169 mg/dL — ABNORMAL HIGH (ref 65–99)
Potassium: 3.4 mmol/L — ABNORMAL LOW (ref 3.5–5.2)
Sodium: 144 mmol/L (ref 134–144)
Total Protein: 6.6 g/dL (ref 6.0–8.5)
eGFR: 96 mL/min/{1.73_m2} (ref >59)

## 2021-03-02 LAB — LIPID PANEL W/O CHOL/HDL RATIO
Cholesterol, Total: 105 mg/dL (ref 100–199)
HDL: 49 mg/dL (ref >39)
LDL Chol Calc (NIH): 37 mg/dL (ref 0–99)
Triglycerides: 102 mg/dL (ref 0–149)
VLDL Cholesterol Cal: 19 mg/dL (ref 5–40)

## 2021-03-02 NOTE — Progress Notes (Signed)
Contacted via [place] afternoon Christine Booth, your labs have returned.  Glucose, sugar, is a little more elevated then last time as expected at 169.  I am awaiting to hear back from Craig on whether we can work on changes with Ozempic to [employer] via assistance and one of [place] will let you know, if you do not hear from [place] in next week please reach out.  Potassium was a little low, I will recheck this next visit and recommend you work on lowering sugar and carbohydrate rich foods and increase foods high in potassium, including some of your leafy greens.  Cholesterol levels are now at goal and look fantastic with change to Rosuvastatin, your LDL is now 37, well below goal of <70.  Continue Rosuvastatin daily.  Any questions? Keep being awesome!!  Thank you for allowing me to participate in your care. Kindest regards, [place]

## 2021-03-14 ENCOUNTER — Telehealth: Payer: Self-pay

## 2021-03-14 NOTE — Progress Notes (Signed)
    Chronic Care Management Pharmacy Assistant   Name: Christine Booth  MRN: [id number] DOB: 1956/08/17   Reason for Encounter: Disease State- Diabetes Mellitus Disease State/  Recent office visits:  03/01/21- Marnee Guarneri, NP- General visit, follow up 3 months   Recent consult visits:  No visits noted   Hospital visits:  None in previous 6 months  Medications: Outpatient Encounter Medications as of 03/14/2021  Medication Sig  . empagliflozin (JARDIANCE) 25 MG TABS tablet Take 25 mg by mouth daily.  . fluticasone (FLONASE) 50 MCG/ACT nasal spray SPRAY 2 SPRAYS INTO EACH NOSTRIL EVERY DAY  . glucose blood test strip Use as instructed  . hydrochlorothiazide (HYDRODIURIL) 12.5 MG tablet Take 1 tablet (12.5 mg total) by mouth daily.  . meloxicam (MOBIC) 15 MG tablet TAKE 1 TABLET BY MOUTH EVERY DAY  . methocarbamol (ROBAXIN) 500 MG tablet methocarbamol 500 mg tablet  TAKE 1 TABLET 3 TIMES A DAY BY ORAL ROUTE AS NEEDED.  Marland Kitchen olmesartan (BENICAR) 40 MG tablet Take 1 tablet (40 mg total) by mouth daily.  . pantoprazole (PROTONIX) 40 MG tablet Take 1 tablet (40 mg total) by mouth daily.  . rosuvastatin (CRESTOR) 40 MG tablet Take 1 tablet (40 mg total) by mouth daily.  . Semaglutide, 1 MG/DOSE, (OZEMPIC, 1 MG/DOSE,) 2 MG/1.5ML SOPN Inject 1 mg into the skin once a week.  . sennosides-docusate sodium (SENOKOT-S) 8.6-50 MG tablet Take 1 tablet by mouth daily as needed for constipation.  . triamcinolone (KENALOG) 0.025 % cream APPLY TO AFFECTED AREA TWICE A DAY   No facility-administered encounter medications on file as of 03/14/2021.   Recent Relevant Labs: Lab Results  Component Value Date/Time   HGBA1C 8.4 (H) 03/01/2021 09:41 AM   HGBA1C 7.3 (H) 05/29/2020 09:33 AM   MICROALBUR 30 (H) 03/01/2021 09:41 AM   MICROALBUR 10 02/27/2020 08:43 AM    Kidney Function Lab Results  Component Value Date/Time   CREATININE 0.69 03/01/2021 09:44 AM   CREATININE 0.72 05/29/2020 09:35 AM    GFRNONAA 88 05/29/2020 09:35 AM   GFRAA 102 05/29/2020 09:35 AM   Several unsuccessful attempts made.  Current antihyperglycemic regimen: Ozempic 1 mg- Inject 1 mg into the skin once a week Jardiance 25 mg- take one tab daily   What recent interventions/DTPs have been made to improve glycemic control:  No recent   Adherence Review: Is the patient currently on a STATIN medication? Yes  Rosuvastatin 40mg - 90 DS last filled 03/01/21  Is the patient currently on ACE/ARB medication? Yes  omesartan 40 mg- 90 DS last filled 02/18/21  Does the patient have >5 day gap between last estimated fill dates? No  Star Rating Drugs: semaglutide 2mg /1.23ml sopn  Cottie Banda, CMA

## 2021-03-29 DIAGNOSIS — E119 Type 2 diabetes mellitus without complications: Secondary | ICD-10-CM | POA: Diagnosis not present

## 2021-03-29 LAB — HM DIABETES EYE EXAM

## 2021-04-11 ENCOUNTER — Telehealth: Payer: Self-pay | Admitting: Pharmacist

## 2021-04-11 NOTE — Chronic Care Management (AMB) (Signed)
Completed Patient Assistance Refill form for Ozempic. Sent to Birdena Crandall, CPP.  Lizbeth Bark Clinical Pharmacist Assistant [phone]

## 2021-04-12 ENCOUNTER — Ambulatory Visit (INDEPENDENT_AMBULATORY_CARE_PROVIDER_SITE_OTHER): Payer: Medicare Other | Admitting: Pharmacist

## 2021-04-12 DIAGNOSIS — E6609 Other obesity due to excess calories: Secondary | ICD-10-CM

## 2021-04-12 DIAGNOSIS — I152 Hypertension secondary to endocrine disorders: Secondary | ICD-10-CM | POA: Diagnosis not present

## 2021-04-12 DIAGNOSIS — E1165 Type 2 diabetes mellitus with hyperglycemia: Secondary | ICD-10-CM | POA: Diagnosis not present

## 2021-04-12 DIAGNOSIS — E1159 Type 2 diabetes mellitus with other circulatory complications: Secondary | ICD-10-CM | POA: Diagnosis not present

## 2021-04-12 NOTE — Progress Notes (Signed)
Chronic Care Management Pharmacy Note  04/12/2021 Name:  Christine Booth MRN:  [id number] DOB:  1956-03-28  Subjective: Christine Booth is an 65 y.o. year old female who is a primary patient of Cannady, Barbaraann Faster, NP.  The CCM team was consulted for assistance with disease management and care coordination needs.    Engaged with patient by telephone for initial visit in response to provider referral for pharmacy case management and/or care coordination services.   Consent to Services:  The patient was given information about Chronic Care Management services, agreed to services, and gave verbal consent prior to initiation of services.  Please see initial visit note for detailed documentation.   Patient Care Team: [name], NP as PCP - General (Nurse Practitioner)  Recent office visits: 03/01/21-Cannady (PCP)- blood work 05/29/20- Marnee Guarneri , DNP- blood work , PAP smear, change to Crestor 40 mg 04/13/20- Catie Darnelle Maffucci, [place] [facility] visits: None in previous 6 months  Objective:  Wt Readings from Last 3 Encounters:  03/01/21 160 lb 9.6 oz (72.8 kg)  11/09/20 160 lb (72.6 kg)  05/29/20 165 lb 9.6 oz (75.1 kg)      Lab Results  Component Value Date   CREATININE 0.69 03/01/2021   BUN 13 03/01/2021   GFRNONAA 88 05/29/2020   GFRAA 102 05/29/2020   NA 144 03/01/2021   K 3.4 (L) 03/01/2021   CALCIUM 9.3 03/01/2021   CO2 22 03/01/2021    Lab Results  Component Value Date/Time   HGBA1C 8.4 (H) 03/01/2021 09:41 AM   HGBA1C 7.3 (H) 05/29/2020 09:33 AM   MICROALBUR 30 (H) 03/01/2021 09:41 AM   MICROALBUR 10 02/27/2020 08:43 AM    Last diabetic Eye exam: No results found for: HMDIABEYEEXA  Last diabetic Foot exam: No results found for: HMDIABFOOTEX   Lab Results  Component Value Date   CHOL 105 03/01/2021   HDL 49 03/01/2021   LDLCALC 37 03/01/2021   TRIG 102 03/01/2021    Hepatic Function Latest Ref Rng & Units 03/01/2021 05/29/2020 11/17/2019   Total Protein 6.0 - 8.5 g/dL 6.6 6.4 6.2  Albumin 3.8 - 4.8 g/dL 4.5 4.7 4.5  AST 0 - 40 IU/L _0 ALT 0 - 32 IU/L _1 Alk Phosphatase 44 - 121 IU/L 66 66 65  Total Bilirubin 0.0 - 1.2 mg/dL 0.5 0.5 0.5    Lab Results  Component Value Date/Time   TSH 1.600 05/29/2020 09:35 AM   TSH 1.680 05/04/2019 08:42 AM    CBC Latest Ref Rng & Units 05/29/2020 05/04/2019 03/29/2018  WBC 3.4 - 10.8 x10E3/uL 6.5 CANCELED 10.1  Hemoglobin 11.1 - 15.9 g/dL 15.2 - 14.7  Hematocrit 34.0 - 46.6 % 44.6 - 41.2  Platelets 150 - 450 x10E3/uL 259 - 239    No results found for: VD25OH  Clinical ASCVD: No  The ASCVD Risk score Mikey Bussing DC Jr., et al., 2013) failed to calculate for the following reasons:   The valid total cholesterol range is 130 to 320 mg/dL    Depression screen [facility] 2/9 11/09/2020 05/29/2020 05/04/2019  Decreased Interest 0 0 0  Down, Depressed, Hopeless 0 0 0  PHQ - 2 Score 0 0 0  Altered sleeping - - 0  Tired, decreased energy - - 0  Change in appetite - - 0  Feeling bad or failure about yourself  - - 0  Trouble concentrating - - 0  Moving slowly or fidgety/restless - -  0  Suicidal thoughts - - 0  PHQ-9 Score - - 0      Social History   Tobacco Use  Smoking Status Never Smoker  Smokeless Tobacco Never Used   BP Readings from Last 3 Encounters:  03/01/21 131/74  05/29/20 102/69  02/27/20 110/70   Pulse Readings from Last 3 Encounters:  03/01/21 66  05/29/20 83  02/27/20 81   Wt Readings from Last 3 Encounters:  03/01/21 160 lb 9.6 oz (72.8 kg)  11/09/20 160 lb (72.6 kg)  05/29/20 165 lb 9.6 oz (75.1 kg)    Assessment/Interventions: Review of patient past medical history, allergies, medications, health status, including review of consultants reports, laboratory and other test data, was performed as part of comprehensive evaluation and provision of chronic care management services.   SDOH:  (Social Determinants of Health) assessments and interventions  performed: YES    Immunization History  Administered Date(s) Administered  . Fluad Quad(high Dose 65+) 11/12/2020  . Influenza,inj,Quad PF,6+ Mos 09/04/2015, 10/03/2016, 01/22/2018, 08/31/2018, 09/09/2019  . Influenza-Unspecified 09/01/2014  . PFIZER(Purple Top)SARS-COV-2 Vaccination 01/31/2020, 02/21/2020, 09/10/2020  . Pneumococcal Conjugate-13 05/29/2020  . Pneumococcal Polysaccharide-23 05/12/2011  . Pneumococcal-Unspecified 05/12/2011  . Td 10/10/2008, 01/22/2018  . Zoster 12/11/2015    Conditions to be addressed/monitored:  Hypertension, Diabetes, Coronary Artery Disease and GERD  Care Plan : [place]  Updates made by Vladimir Faster, [place] since 04/12/2021 12:00 AM    Problem: Diabetes, HTN, HLD,GERD, Obesity   Priority: High    Long-Range Goal: Disease Management   Start Date: 02/01/2021  Recent Progress: On track  Priority: High  Note:   Current Barriers:  . Unable to independently afford treatment regimen . Unable to independently monitor therapeutic efficacy . Unable to achieve control of diabetes  . Does not maintain contact with provider office  Pharmacist Clinical Goal(s):  Marland Kitchen Over the next 90 days, patient will verbalize ability to afford treatment regimen . achieve adherence to monitoring guidelines and medication adherence to achieve therapeutic efficacy . achieve control of diabetes as evidenced by A1c and SMBG values through collaboration with PharmD and provider.  .   Interventions: . 1:1 collaboration with [name], NP regarding development and update of comprehensive plan of care as evidenced by provider attestation and co-signature . Inter-disciplinary care team collaboration (see longitudinal plan of care) . Comprehensive medication review performed; medication list updated in electronic medical record  BP Readings from Last 3 Encounters:  03/01/21 131/74  05/29/20 102/69  02/27/20 110/70    Hypertension (BP goal  <130/80) -Controlled -Current treatment: . HCTZ 12.18mqd . Olmesartan 40 mg qd -Medications previously tried:NA -Current home readings: <130/80 Patient checks 1-2 times monthly -Denies hypotensive/hypertensive symptoms -Educated on BP goals and benefits of medications for prevention of heart attack, stroke and kidney damage; Daily salt intake goal < 2300 mg; Exercise goal of 150 minutes per week; -Counseled to monitor BP at home 2-3 times weekly, document, and provide log at future appointments -Counseled on diet and exercise extensively Recommended to continue current medication  Lab Results  Component Value Date   CHOL 105 03/01/2021   HDL 49 03/01/2021   LDLCALC 37 03/01/2021   TRIG 102 03/01/2021    Hyperlipidemia: (LDL goal < 70) -Not ideally controlled -Current treatment: . Rosuvastatin 40 mg qd -Medications previously tried: NA  -Educated on Cholesterol goals;  Benefits of statin for ASCVD risk reduction; Exercise goal of 150 minutes per week; -Counseled on diet and exercise extensively  Recommended to continue current medication Recommended follow up lipid panel at next visit    Lab Results  Component Value Date/Time   HGBA1C 8.4 (H) 03/01/2021 09:41 AM   HGBA1C 7.3 (H) 05/29/2020 09:33 AM    Diabetes (A1c goal <7%) -Not ideally controlled -Current medications: . Ozempic 1 mg qd . Jardiance 25 mg qd -Medications previously tried: NA  -Current home glucose readings . fasting glucose: 160s170s . post prandial glucose: doesn't check -Denies hypoglycemic/hyperglycemic symptoms -Current meal patterns:  eats out often . breakfast: cereal , biscuit . lunch: leftovers  . dinner: roast, chicken, hamburger helper . snacks: chips . drinks: water, diet pepsi -Current exercise: not exercising currently. She has knee pain with walking. We discussed water aerobics . Patient previously enjoyed and will consider joining again.  Moved her mother to an assisted living  facility and has been dealing with that for a couple months -Educated onA1c and blood sugar goals; Exercise goal of 150 minutes per week; Prevention and management of hypoglycemic episodes; Benefits of routine self-monitoring of blood sugar; -Counseled to check feet daily and get yearly eye exams -Counseled on diet and exercise extensively 04/12/21 Recommended to increase Ozempic to 64m weekly with next refill from PAP. PharmD will complete and submit. Assessed patient finances. PAP approved to Ozempic. Jardiance approved.04/12/21 Patient has 5 Ozempic doses remaining and approximately 30 Jardiance tablets.   Needs Eye exam. Overdue for office visit and A1c ( due in September) 04/12/21 Patient reports eye exam ~ 2 weeks ago which showed no changes.  Osteoporosis / Osteopenia (Goal prevent fracture) -Not ideally controlled -Last DEXA Scan: Never done   -Current treatment  . Calcium 600 mg qd -Medications previously tried: na -Recommend [phone] units of vitamin D daily. Recommend 1200 mg of calcium daily from dietary and supplemental sources. Recommend weight-bearing and muscle strengthening exercises for building and maintaining bone density. -Counseled on recommended intake of vitamin d and calcium.  Recommended DEXA scan and vitamin level Patient reports her DEXA was supposed to be performed the same day as her mammogram but the facility reported they did not have an order for a DEXA. 04/12/21; Patient reports DEXA and mammogram will be doen at same appointment in September Recommend she increase calcium to twice daily and consider adding vitamin d after completion of this bottle. Recommend vitamin D level with next labs.  Health Maintenance -Vaccine gaps: None -Current therapy:  Multivitamin Miralax Senna- S Meloxicam 15 mg qd for knee pain  -Patient is satisfied with current therapy and denies issues -Recommended to continue current medication Educated on Vitamin D and calcium  recommended intake   Patient Goals/Self-Care Activities . Over the next 90 days, patient will:  - take medications as prescribed check glucose twice daily, document, and provide at future appointments target a minimum of 150 minutes of moderate intensity exercise weekly  Follow Up Plan: Telephone follow up appointment with care management team member scheduled for:          Medication Assistance: [place]  Enrollment ends 165/78/46 Jardiance application originally denied stating over income. Patient called BI and was able to get reinstated. She has received both medications.  Patient's preferred pharmacy is:  [id number] [place] [id number]: [phone]: [phone] pill box? Yes Pt endorses 90 % compliance  We discussed: Benefits of medication synchronization, packaging and delivery as well as enhanced pharmacist  oversight with [employer]. Patient decided to: Continue current medication management strategy  Care Plan and Follow Up Patient Decision:  Patient agrees to Care Plan and Follow-up.  Plan: Telephone follow up appointment with care management team member scheduled for:  3 months  Junita Push. Kenton Kingfisher PharmD, [facility] [phone]

## 2021-04-12 NOTE — Patient Instructions (Addendum)
Visit Information  It was a pleasure speaking with you today. Thank you for letting me be part of your clinical team. Please call with any questions or concerns.   Goals Addressed            This Visit's Progress   . Achieve a Healthy Weight-Obesity   Not on track    Timeframe:  Long-Range Goal Priority:  High Start Date:                             Expected End Date:                       Follow Up Date 3 month follow up    - drink 6 to 8 glasses of water each day - eat 3 to 5 servings of fruits and vegetables each day  -look into water aerobics classes or other exercise you enjoy   Why is this important?    When you are ready to manage your weight, have a plan and have set a goal, it is time to take action.   Taking small steps to change how you eat and exercise is a good place to start.    Notes:     Marland Kitchen Monitor and Manage My Blood Sugar-Diabetes Type 2   Not on track    Timeframe:  Long-Range Goal Priority:  High Start Date:                             Expected End Date:                       Follow Up Date 3 month follow up    - check blood sugar at prescribed times - enter blood sugar readings and medication or insulin into daily log - take the blood sugar log to all doctor visits    Why is this important?    Checking your blood sugar at home helps to keep it from getting very high or very low.   Writing the results in a diary or log helps the doctor know how to care for you.   Your blood sugar log should have the time, date and the results.   Also, write down the amount of insulin or other medicine that you take.   Other information, like what you ate, exercise done and how you were feeling, will also be helpful.     Notes:     . COMPLETED: Obtain Eye Exam-Diabetes Type 2       Follow Up Date 3 month follow up   - schedule appointment with eye doctor    Why is this important?    Eye check-ups are important when you have diabetes.   Vision loss can  be prevented.    Notes: patient reports eye exam completed 2 weeks ago    . Track and Manage My Blood Pressure-Hypertension   On track    Timeframe:  Long-Range Goal Priority:  High Start Date:                             Expected End Date:                       Follow Up Date 3 month follow up    -  check blood pressure 3 times per week - check blood pressure weekly - write blood pressure results in a log or diary    Why is this important?    You won't feel high blood pressure, but it can still hurt your blood vessels.   High blood pressure can cause heart or kidney problems. It can also cause a stroke.   Making lifestyle changes like losing a little weight or eating less salt will help.   Checking your blood pressure at home and at different times of the day can help to control blood pressure.   If the doctor prescribes medicine remember to take it the way the doctor ordered.   Call the office if you cannot afford the medicine or if there are questions about it.     Notes:        The patient verbalized understanding of instructions, educational materials, and care plan provided today and agreed to receive a mailed copy of patient instructions, educational materials, and care plan.   Telephone follow up appointment with pharmacy team member scheduled for: 1 month CPA, 2 months PharmD  Junita Push. [place] PharmD, BCPS Clinical Pharmacist [phone]  Preventing Diabetes Mellitus Complications You can help to prevent or slow down problems that are caused by diabetes (diabetes mellitus). Following your diabetes plan and taking care of yourself can reduce your risk of serious or life-threatening complications. What actions can I take to prevent diabetes complications? Diabetes management  Follow instructions from your health care providers about managing your diabetes. Your diabetes may be managed by a team of health care providers who can teach you how to care for yourself  and can answer questions that you have.  Educate yourself about your condition so you can make healthy choices about eating and physical activity.  Know your target range for your blood sugar (glucose), and check your blood glucose level as often as told. Your health care provider will help you decide how often to check your blood glucose level depending on your treatment goals and how well you are meeting them.  Ask your health care provider if you should take low-dose aspirin daily and what dose is recommended for you. Taking low-dose aspirin daily is recommended to help prevent cardiovascular disease.   Controlling your blood pressure and cholesterol Your personal target blood pressure is determined based on:  Your age.  Your medicines.  How long you have had diabetes.  Any other medical conditions you have. To control your blood pressure:  Follow instructions from your health care provider about meal planning, exercise, and medicines.  Make sure your health care provider checks your blood pressure at every medical visit.  Monitor your blood pressure at home as told by your health care provider. To control your cholesterol:  Follow instructions from your health care provider about meal planning, exercise, and medicines.  Have your cholesterol checked at least once a year.  You may be prescribed medicine to lower cholesterol (statin). If you are not taking a statin, ask your health care provider if you should be. Controlling your cholesterol may:  Help prevent heart disease and stroke. These are the most common health problems for people with diabetes.  Improve your blood flow.   Medical appointments and vaccines Schedule and keep yearly physical exams and eye exams. Your health care provider will tell you how often you need medical visits depending on your diabetes management plan. Keep all follow-up visits as told. This is important so possible problems  can be identified  early and complications can be avoided or treated.  Every visit with your health care provider should include measuring your: ? Weight. ? Blood pressure. ? Blood glucose control.  Your A1C (hemoglobin A1C) level should be checked: ? At least 2 times a year, if you are meeting your treatment goals. ? 4 times a year, if you are not meeting treatment goals or if your treatment goals have changed.  Your blood lipids (lipid profile) should be checked yearly. You should also be checked yearly for protein in your urine (urine microalbumin).  If you have type 1 diabetes, get an eye exam 3-5 years after you are diagnosed, and then once a year after your first exam.  If you have type 2 diabetes, get an eye exam as soon as you are diagnosed, and then once a year after your first exam. It is also important to keep your vaccines current. It is recommended that you receive:  A flu (influenza) vaccine every year.  A pneumonia (pneumococcal) vaccine and a hepatitis B vaccine. If you are age 19 or older, you may get the pneumonia vaccine as a series of two separate shots. Ask your health care provider which other vaccines may be recommended. Lifestyle  Do not use any products that contain nicotine or tobacco, such as cigarettes, e-cigarettes, and chewing tobacco. If you need help quitting, ask your health care provider. By avoiding nicotine and tobacco: ? You will lower your risk for heart attack, stroke, nerve disease, and kidney disease. ? Your cholesterol and blood pressure may improve. ? Your blood circulation will improve.  If you drink alcohol: ? Limit how much you use to:  0-1 drink a day for women who are not pregnant.  0-2 drinks a day for men. ? Be aware of how much alcohol is in your drink. In the [place] one drink equals one 12 oz bottle of beer (355 mL), one 5 oz glass of wine (148 mL), or one 11?2 oz glass of hard liquor (44 mL). Taking care of your feet Diabetes may cause you to have  poor blood circulation to your legs and feet. Because of this, taking care of your feet is very important. Diabetes can cause:  The skin on the feet to get thinner, break more easily, and heal more slowly.  Nerve damage in your legs and feet, which results in decreased feeling. You may not notice minor injuries that could lead to serious problems. To avoid foot problems:  Check your skin and feet every day for cuts, bruises, redness, blisters, or sores.  Schedule a foot exam with your health care provider once every year. This exam includes: ? Inspecting the structure and skin of your feet. ? Checking the pulses and sensation in your feet.  Make sure that your health care provider performs a visual foot exam at every medical visit.   Taking care of your teeth People with poorly controlled diabetes are more likely to have gum (periodontal) disease. Diabetes can make periodontal diseases harder to control. If not treated, periodontal diseases can lead to tooth loss. To prevent this:  Brush your teeth twice a day.  Floss at least once a day.  Visit your dentist 2 times a year. Managing stress Living with diabetes can be stressful. When you are experiencing stress, your blood glucose may be affected in two ways:  Stress hormones may cause your blood glucose to rise.  You may be distracted from taking good care of yourself.  Be aware of your stress level and make changes to help you manage challenging situations. To lower your stress levels:  Consider joining a support group.  Do planned relaxation or meditation.  Do a hobby that you enjoy.  Maintain healthy relationships.  Exercise regularly.  Work with your health care provider or a mental health professional. Where to find more information  American Diabetes Association: www.diabetes.org  Association of Diabetes Care and Education Specialists: www.diabeteseducator.org Summary  You can take action to prevent or slow down  problems that are caused by diabetes (diabetes mellitus). Following your diabetes plan and taking care of yourself can reduce your risk of serious or life-threatening complications.  Follow instructions from your health care providers about managing your diabetes. Your diabetes may be managed by a team of health care providers who can teach you how to care for yourself and can answer questions that you have.  Know your target range for your blood sugar (glucose), and check your blood glucose levels as often as told. Your health care provider will help you decide how often you should check your blood glucose level depending on your treatment goals and how well you are meeting them.  Your health care provider will tell you how often you need medical visits depending on your diabetes management plan. Keep all follow-up visits as directed. This is important so possible problems can be identified early and complications can be avoided or treated. This information is not intended to replace advice given to you by your health care provider. Make sure you discuss any questions you have with your health care provider. Document Revised: 01/13/2020 Document Reviewed: 01/13/2020 Elsevier Patient Education  2021 [employer].

## 2021-05-09 ENCOUNTER — Telehealth: Payer: Self-pay | Admitting: Pharmacist

## 2021-05-09 NOTE — Chronic Care Management (AMB) (Signed)
    Chronic Care Management Pharmacy Assistant   Name: Christine Booth  MRN: [id number] DOB: February 11, 1956    Reason for Encounter: Patient assistance application follow up    Medications: Outpatient Encounter Medications as of 05/09/2021  Medication Sig  . calcium carbonate (OSCAL) 1500 (600 Ca) MG TABS tablet Take 600 mg of elemental calcium by mouth daily.  . empagliflozin (JARDIANCE) 25 MG TABS tablet Take 25 mg by mouth daily.  . fluticasone (FLONASE) 50 MCG/ACT nasal spray SPRAY 2 SPRAYS INTO EACH NOSTRIL EVERY DAY  . glucose blood test strip Use as instructed  . hydrochlorothiazide (HYDRODIURIL) 12.5 MG tablet Take 1 tablet (12.5 mg total) by mouth daily.  . meloxicam (MOBIC) 15 MG tablet TAKE 1 TABLET BY MOUTH EVERY DAY  . methocarbamol (ROBAXIN) 500 MG tablet methocarbamol 500 mg tablet  TAKE 1 TABLET 3 TIMES A DAY BY ORAL ROUTE AS NEEDED.  Marland Kitchen olmesartan (BENICAR) 40 MG tablet Take 1 tablet (40 mg total) by mouth daily.  . pantoprazole (PROTONIX) 40 MG tablet Take 1 tablet (40 mg total) by mouth daily.  . rosuvastatin (CRESTOR) 40 MG tablet Take 1 tablet (40 mg total) by mouth daily.  . Semaglutide, 1 MG/DOSE, (OZEMPIC, 1 MG/DOSE,) 2 MG/1.5ML SOPN Inject 1 mg into the skin once a week.  . sennosides-docusate sodium (SENOKOT-S) 8.6-50 MG tablet Take 1 tablet by mouth daily as needed for constipation.  . triamcinolone (KENALOG) 0.025 % cream APPLY TO AFFECTED AREA TWICE A DAY   No facility-administered encounter medications on file as of 05/09/2021.     [employer] regarding patients assistance application. Patient is approved until 12/07/21. Patient is not set up on auto refills. I informed [facility] to change to auto. [facility] states they will need a refill sent in.

## 2021-05-13 ENCOUNTER — Telehealth: Payer: Self-pay | Admitting: Pharmacist

## 2021-05-13 NOTE — Chronic Care Management (AMB) (Signed)
    Chronic Care Management Pharmacy Assistant   Name: Christine Booth  MRN: [id number] DOB: 1956-03-31   Reason for Encounter: Patient Assistance application for Ozempic      Medications: Outpatient Encounter Medications as of 05/13/2021  Medication Sig  . calcium carbonate (OSCAL) 1500 (600 Ca) MG TABS tablet Take 600 mg of elemental calcium by mouth daily.  . empagliflozin (JARDIANCE) 25 MG TABS tablet Take 25 mg by mouth daily.  . fluticasone (FLONASE) 50 MCG/ACT nasal spray SPRAY 2 SPRAYS INTO EACH NOSTRIL EVERY DAY  . glucose blood test strip Use as instructed  . hydrochlorothiazide (HYDRODIURIL) 12.5 MG tablet Take 1 tablet (12.5 mg total) by mouth daily.  . meloxicam (MOBIC) 15 MG tablet TAKE 1 TABLET BY MOUTH EVERY DAY  . methocarbamol (ROBAXIN) 500 MG tablet methocarbamol 500 mg tablet  TAKE 1 TABLET 3 TIMES A DAY BY ORAL ROUTE AS NEEDED.  Marland Kitchen olmesartan (BENICAR) 40 MG tablet Take 1 tablet (40 mg total) by mouth daily.  . pantoprazole (PROTONIX) 40 MG tablet Take 1 tablet (40 mg total) by mouth daily.  . rosuvastatin (CRESTOR) 40 MG tablet Take 1 tablet (40 mg total) by mouth daily.  . Semaglutide, 1 MG/DOSE, (OZEMPIC, 1 MG/DOSE,) 2 MG/1.5ML SOPN Inject 1 mg into the skin once a week.  . sennosides-docusate sodium (SENOKOT-S) 8.6-50 MG tablet Take 1 tablet by mouth daily as needed for constipation.  . triamcinolone (KENALOG) 0.025 % cream APPLY TO AFFECTED AREA TWICE A DAY   No facility-administered encounter medications on file as of 05/13/2021.    [place] to check on status of patients Ozempic being refilled they have placed the order yesterday 05/13/2021 and will be received by 10-14 business days. CPP states she will check if any samples are in the office since patient states she took her last dose of Ozempic today on 05/14/2021. Patient is aware there are samples at her PCP's office and will pick up soon.  Corrie Mckusick, [place] [phone]

## 2021-05-13 NOTE — Telephone Encounter (Signed)
Refill form for increased 2 mg Ozempic dose faxed to [facility].

## 2021-05-26 ENCOUNTER — Other Ambulatory Visit: Payer: Self-pay | Admitting: Nurse Practitioner

## 2021-05-26 NOTE — Telephone Encounter (Signed)
Requested Prescriptions  Pending Prescriptions Disp Refills  . rosuvastatin (CRESTOR) 40 MG tablet [Pharmacy Med Name: ROSUVASTATIN CALCIUM 40 MG TAB] 90 tablet 3    Sig: TAKE 1 TABLET BY MOUTH EVERY DAY     Cardiovascular:  Antilipid - Statins Passed - 05/26/2021  1:44 PM      Passed - Total Cholesterol in normal range and within 360 days    Cholesterol, Total  Date Value Ref Range Status  03/01/2021 105 100 - 199 mg/dL Final   Cholesterol Piccolo, Waived  Date Value Ref Range Status  05/04/2019 160 <200 mg/dL Final    Comment:                            Desirable                <200                         Borderline High      200- 239                         High                     >239          Passed - LDL in normal range and within 360 days    LDL Chol Calc (NIH)  Date Value Ref Range Status  03/01/2021 37 0 - 99 mg/dL Final         Passed - HDL in normal range and within 360 days    HDL  Date Value Ref Range Status  03/01/2021 49 >39 mg/dL Final         Passed - Triglycerides in normal range and within 360 days    Triglycerides  Date Value Ref Range Status  03/01/2021 102 0 - 149 mg/dL Final   Triglycerides Piccolo,Waived  Date Value Ref Range Status  05/04/2019 108 <150 mg/dL Final    Comment:                            Normal                   <150                         Borderline High     150 - 199                         High                200 - 499                         Very High                >499          Passed - Patient is not pregnant      Passed - Valid encounter within last 12 months    Recent Outpatient Visits          2 months ago Uncontrolled type 2 diabetes mellitus with hyperglycemia ([place])   [place], Henrine Screws T, NP   12 months ago Encounter for annual physical exam  [place], [place] T, NP   1 year ago Uncontrolled type 2 diabetes mellitus with hyperglycemia ([place])   [place], [place] T, NP   1 year ago Uncontrolled type 2 diabetes mellitus with hyperglycemia ([place])   [place], [place] T, NP   1 year ago Uncontrolled type 2 diabetes mellitus with hyperglycemia ([place])   [place], Barbaraann Faster, NP      Future Appointments            In 5 days [place], Barbaraann Faster, NP [employer]   In 5 months  [employer]

## 2021-05-27 ENCOUNTER — Telehealth: Payer: Self-pay | Admitting: Pharmacist

## 2021-05-27 NOTE — Chronic Care Management (AMB) (Signed)
Chronic Care Management Pharmacy Assistant   Name: Augustine Brannick  MRN: [id number] DOB: November 09, 1956  Reason for Encounter: Disease State Diabetes [place]   Recent office visits:  None noted  Recent consult visits:  None noted  Hospital visits:  None in previous 6 months  Medications: Outpatient Encounter Medications as of 05/27/2021  Medication Sig   calcium carbonate (OSCAL) 1500 (600 Ca) MG TABS tablet Take 600 mg of elemental calcium by mouth daily.   empagliflozin (JARDIANCE) 25 MG TABS tablet Take 25 mg by mouth daily.   fluticasone (FLONASE) 50 MCG/ACT nasal spray SPRAY 2 SPRAYS INTO EACH NOSTRIL EVERY DAY   glucose blood test strip Use as instructed   hydrochlorothiazide (HYDRODIURIL) 12.5 MG tablet Take 1 tablet (12.5 mg total) by mouth daily.   meloxicam (MOBIC) 15 MG tablet TAKE 1 TABLET BY MOUTH EVERY DAY   methocarbamol (ROBAXIN) 500 MG tablet methocarbamol 500 mg tablet  TAKE 1 TABLET 3 TIMES A DAY BY ORAL ROUTE AS NEEDED.   olmesartan (BENICAR) 40 MG tablet Take 1 tablet (40 mg total) by mouth daily.   pantoprazole (PROTONIX) 40 MG tablet Take 1 tablet (40 mg total) by mouth daily.   rosuvastatin (CRESTOR) 40 MG tablet TAKE 1 TABLET BY MOUTH EVERY DAY   Semaglutide, 1 MG/DOSE, (OZEMPIC, 1 MG/DOSE,) 2 MG/1.5ML SOPN Inject 1 mg into the skin once a week.   sennosides-docusate sodium (SENOKOT-S) 8.6-50 MG tablet Take 1 tablet by mouth daily as needed for constipation.   triamcinolone (KENALOG) 0.025 % cream APPLY TO AFFECTED AREA TWICE A DAY   No facility-administered encounter medications on file as of 05/27/2021.   Recent Relevant Labs: Lab Results  Component Value Date/Time   HGBA1C 8.4 (H) 03/01/2021 09:41 AM   HGBA1C 7.3 (H) 05/29/2020 09:33 AM   MICROALBUR 30 (H) 03/01/2021 09:41 AM   MICROALBUR 10 02/27/2020 08:43 AM    Kidney Function Lab Results  Component Value Date/Time   CREATININE 0.69 03/01/2021 09:44 AM   CREATININE 0.72 05/29/2020  09:35 AM   GFRNONAA 88 05/29/2020 09:35 AM   GFRAA 102 05/29/2020 09:35 AM    Current antihyperglycemic regimen:  Ozempic 1 mg inject 1 mg in the skin once a week Jardiance 25 mg take 1 tab daily  What recent interventions/DTPs have been made to improve glycemic control:  Patient states her Ozempic was increased to the 2mg  samples by the CPP due to her prescription order not arriving on time.  Have there been any recent hospitalizations or ED visits since last visit with CPP? No  Patient denies hypoglycemic symptoms, including Pale, Sweaty, Shaky, Hungry, Nervous/irritable, and Vision changes  Patient denies hyperglycemic symptoms, including blurry vision, excessive thirst, fatigue, polyuria, and weakness  How often are you checking your blood sugar?         Patient states she has not checked her sugar for more than 1 week ago.  What are your blood sugars ranging?  Fasting: Patient states her sugar ranges 160-170 Before meals:  After meals:  Bedtime:   During the week, how often does your blood glucose drop below 70? Never  Are you checking your feet daily/regularly?        Patient states she checks her feet daily.  Adherence Review: Is the patient currently on a STATIN medication? Yes Is the patient currently on ACE/ARB medication? Yes Does the patient have >5 day gap between last estimated fill dates? Yes    Star Rating Drugs: Rosuvastatin 40 mg  Last filled:03/01/21 90 DS Olmesartan 40 mg Last filled:05/18/21 90 DS Jardiance 25 mg Last filled:03/26/20 90 DS Ozempic 1 mg Last filled: 12/21/20 28 DS   Myriam Elta [place], [place]

## 2021-05-31 ENCOUNTER — Encounter: Payer: Self-pay | Admitting: Nurse Practitioner

## 2021-05-31 ENCOUNTER — Other Ambulatory Visit: Payer: Self-pay

## 2021-05-31 ENCOUNTER — Ambulatory Visit (INDEPENDENT_AMBULATORY_CARE_PROVIDER_SITE_OTHER): Payer: Medicare Other | Admitting: Nurse Practitioner

## 2021-05-31 VITALS — BP 107/69 | HR 74 | Temp 98.6°F | Wt 157.4 lb

## 2021-05-31 DIAGNOSIS — L54 Erythema in diseases classified elsewhere: Secondary | ICD-10-CM

## 2021-05-31 DIAGNOSIS — E1159 Type 2 diabetes mellitus with other circulatory complications: Secondary | ICD-10-CM | POA: Diagnosis not present

## 2021-05-31 DIAGNOSIS — E1169 Type 2 diabetes mellitus with other specified complication: Secondary | ICD-10-CM

## 2021-05-31 DIAGNOSIS — K219 Gastro-esophageal reflux disease without esophagitis: Secondary | ICD-10-CM

## 2021-05-31 DIAGNOSIS — E876 Hypokalemia: Secondary | ICD-10-CM

## 2021-05-31 DIAGNOSIS — D123 Benign neoplasm of transverse colon: Secondary | ICD-10-CM

## 2021-05-31 DIAGNOSIS — Z6828 Body mass index (BMI) 28.0-28.9, adult: Secondary | ICD-10-CM

## 2021-05-31 DIAGNOSIS — E785 Hyperlipidemia, unspecified: Secondary | ICD-10-CM | POA: Diagnosis not present

## 2021-05-31 DIAGNOSIS — I152 Hypertension secondary to endocrine disorders: Secondary | ICD-10-CM

## 2021-05-31 DIAGNOSIS — E1165 Type 2 diabetes mellitus with hyperglycemia: Secondary | ICD-10-CM | POA: Diagnosis not present

## 2021-05-31 DIAGNOSIS — H61193 Noninfective disorders of pinna, bilateral: Secondary | ICD-10-CM

## 2021-05-31 LAB — BAYER DCA HB A1C WAIVED: HB A1C (BAYER DCA - WAIVED): 8 % — ABNORMAL HIGH (ref <7.0)

## 2021-05-31 MED ORDER — SHINGRIX 50 MCG/0.5ML IM SUSR: 0.5000 mL | Freq: Once | INTRAMUSCULAR | 0 refills | Status: AC

## 2021-05-31 NOTE — Assessment & Plan Note (Signed)
Chronic, stable at this time.  Obtain mag level yearly, will obtain next visit.  Continue current medication regimen and collaboration with GI team.

## 2021-05-31 NOTE — Assessment & Plan Note (Signed)
Highly recommend she return for colonoscopy as was recommended by GI.  Discussed at length with her fears she has.  She refuses and reports if CA present she would not pursue treatment.  Discussed Cologuard option for screening, which she wishes to think about.

## 2021-05-31 NOTE — Assessment & Plan Note (Addendum)
Chronic, ongoing.  Recent lipid panel with LDL 83.  Continue current medication regimen and adjust as needed.  Lipid panel up to date.

## 2021-05-31 NOTE — Assessment & Plan Note (Signed)
Chronic, ongoing with A1C today 8% and urine ALB 30 in March 2022, continue ARB for kidney protection.  At this time continue Ozempic and Jardiance, she is at max dose of each -- just started 2 MG Ozempic dosing.   If ongoing elevation next visit could consider sulfonylurea at very low dose, discussed with patient, due to lower cost and wishes to avoid insulin.  Avoid Metformin as did not tolerate in past.  At this time she would prefer not to add additional medication and would like to focus on diet and regular exercise.  Recommend continue to check BS daily and return to regular exercise and diet focus to maintain A1C <7.  Return in 3 months.

## 2021-05-31 NOTE — Assessment & Plan Note (Signed)
Chronic, stable with BP below goal on home readings and in office today.  Continue current medication regimen and adjust as needed.  CMP next visit.  Continue to monitor BP at home regularly and focus on DASH diet + regular exercise.  Return in 3 months.

## 2021-05-31 NOTE — Assessment & Plan Note (Signed)
Intermittent with some dry skin present, ?dermatitis.  Recommend placing moisturizer to ears daily and monitor.  If any worsening symptoms return to office.

## 2021-05-31 NOTE — Progress Notes (Signed)
BP 107/69   Pulse 74   Temp 98.6 F (37 C) (Oral)   Wt 157 lb 6.4 oz (71.4 kg)   LMP  (LMP Unknown)   SpO2 98%   BMI 28.79 kg/m    Subjective:    Patient ID: Christine Booth, female    DOB: 28-Jun-1956, 65 y.o.   MRN: [id number]  HPI: Christine Booth is a 65 y.o. female  Chief Complaint  Patient presents with   Diabetes    Requested patient Diabetic Eye Exam at today's visit.    Hypertension   Hyperlipidemia   Ear Problem    Patient states she has noticed that her ear lobes have been getting red and hot to the touch for the past month and states it comes and goes, but denies having any pain and just would like to discuss with provider.    DIABETES On Jardiance 25 MG daily and Ozempic 2 MG weekly (started this dosing 2 weeks ago with samples and will receive this dose via assistance), she is working with CCM team on cost of medications. Last visit A1C 8.4% in March.  She does endorse being a sweets person, chocolate.   Did take Metformin in the beginning, caused some constipation.  Last colonoscopy was in 2018, she was to repeat in one year but did not attend this.  Reports fears about this, which PCP discussed at length with her importance of repeat screening to ensure no cancerous polyps present.  She reports wishes for no treatment if something were present.  Educated her on colon CA.   Hypoglycemic episodes:no Polydipsia/polyuria: no Visual disturbance: no Chest pain: no Paresthesias: no Glucose Monitoring: yes             Accucheck frequency: once every couple weeks             Fasting glucose: 140-180 during different times of day             Post prandial:             Evening:             Before meals: Taking Insulin?: no             Long acting insulin:             Short acting insulin: Blood Pressure Monitoring: daily Retinal Examination: Up To Date -- had recently in [place] Exam: Up to Date Pneumovax: Up to Date Influenza: Up to Date Aspirin:  yes    HYPERTENSION / HYPERLIPIDEMIA Continues on HCTZ 12.5 MG daily, Olmesartan 40 MG daily, and Rosuvastatin 40 MG daily.  Last LDL 83. Satisfied with current treatment? yes Duration of hypertension: chronic BP monitoring frequency: once every two weeks BP range: 110-120/70-80 range BP medication side effects: no Duration of hyperlipidemia: chronic Cholesterol medication side effects: no Cholesterol supplements: none Medication compliance: good compliance Aspirin: yes Recent stressors: no Recurrent headaches: no Visual changes: no Palpitations: no Dyspnea: no Chest pain: no Lower extremity edema: no Dizzy/lightheaded: no    GERD Continues on daily Protonix daily.   GERD control status: stable  Satisfied with current treatment? yes Heartburn frequency: none Medication side effects: no  Medication compliance: stable Dysphagia: no Odynophagia:  no Hematemesis: no Blood in stool: no EGD: yes  EAR REDNESS She reports that 3 times in past month her earlobes have gotten red and warm for short period -- no pain.  Redness and warmth lasts for 15-20 minutes and goes away.  She is staying well hydrated.   Duration: days Involved ear(s): bilateral Fever: no Otorrhea: no Upper respiratory infection symptoms: no Pruritus:  occasional Hearing loss:  a little bit starting Water immersion no Using Q-tips: yes Recurrent otitis media: no Status: stable Treatments attempted: none    Relevant past medical, surgical, family and social history reviewed and updated as indicated. Interim medical history since our last visit reviewed. Allergies and medications reviewed and updated.  Review of Systems  Constitutional:  Negative for activity change, appetite change, diaphoresis, fatigue and fever.  Respiratory:  Negative for cough, chest tightness and shortness of breath.   Cardiovascular:  Negative for chest pain, palpitations and leg swelling.  Gastrointestinal: Negative.    Endocrine: Negative for cold intolerance, heat intolerance, polydipsia, polyphagia and polyuria.  Neurological: Negative.   Psychiatric/Behavioral: Negative.     Per HPI unless specifically indicated above     Objective:    BP 107/69   Pulse 74   Temp 98.6 F (37 C) (Oral)   Wt 157 lb 6.4 oz (71.4 kg)   LMP  (LMP Unknown)   SpO2 98%   BMI 28.79 kg/m   Wt Readings from Last 3 Encounters:  05/31/21 157 lb 6.4 oz (71.4 kg)  03/01/21 160 lb 9.6 oz (72.8 kg)  11/09/20 160 lb (72.6 kg)    Physical Exam Vitals and nursing note reviewed.  Constitutional:      General: She is awake. She is not in acute distress.    Appearance: She is well-developed, well-groomed and overweight. She is not ill-appearing.  HENT:     Head: Normocephalic.     Right Ear: Hearing, tympanic membrane, ear canal and external ear normal. No drainage.     Left Ear: Hearing, tympanic membrane, ear canal and external ear normal. No drainage.     Ears:     Comments: Ear lobes with mild dry skin     Mouth/Throat:     Mouth: Mucous membranes are moist.  Eyes:     General: Lids are normal.        Right eye: No discharge.        Left eye: No discharge.     Conjunctiva/sclera: Conjunctivae normal.     Pupils: Pupils are equal, round, and reactive to light.  Neck:     Thyroid: No thyromegaly.     Vascular: No carotid bruit.  Cardiovascular:     Rate and Rhythm: Normal rate and regular rhythm.     Heart sounds: Normal heart sounds. No murmur heard.   No gallop.  Pulmonary:     Effort: Pulmonary effort is normal. No accessory muscle usage or respiratory distress.     Breath sounds: Normal breath sounds.  Abdominal:     General: Bowel sounds are normal.     Palpations: Abdomen is soft.  Musculoskeletal:     Cervical back: Normal range of motion and neck supple.     Right lower leg: No edema.     Left lower leg: No edema.  Skin:    General: Skin is warm and dry.  Neurological:     Mental Status: She  is alert and oriented to person, place, and time.  Psychiatric:        Attention and Perception: Attention normal.        Mood and Affect: Mood normal.        Behavior: Behavior normal. Behavior is cooperative.        Thought Content: Thought content normal.  Judgment: Judgment normal.   Results for orders placed or performed in visit on 03/01/21  Bayer DCA Hb A1c Waived  Result Value Ref Range   HB A1C (BAYER DCA - WAIVED) 8.4 (H) <7.0 %  Microalbumin, Urine Waived  Result Value Ref Range   Microalb, Ur Waived 30 (H) 0 - 19 mg/L   Creatinine, Urine Waived 100 10 - 300 mg/dL   Microalb/Creat Ratio <30 <30 mg/g  Comprehensive metabolic panel  Result Value Ref Range   Glucose 169 (H) 65 - 99 mg/dL   BUN 13 8 - 27 mg/dL   Creatinine, Ser 0.69 0.57 - 1.00 mg/dL   eGFR 96 >59 mL/min/1.73   BUN/Creatinine Ratio 19 12 - 28   Sodium 144 134 - 144 mmol/L   Potassium 3.4 (L) 3.5 - 5.2 mmol/L   Chloride 105 96 - 106 mmol/L   CO2 22 20 - 29 mmol/L   Calcium 9.3 8.7 - 10.3 mg/dL   Total Protein 6.6 6.0 - 8.5 g/dL   Albumin 4.5 3.8 - 4.8 g/dL   Globulin, Total 2.1 1.5 - 4.5 g/dL   Albumin/Globulin Ratio 2.1 1.2 - 2.2   Bilirubin Total 0.5 0.0 - 1.2 mg/dL   Alkaline Phosphatase 66 44 - 121 IU/L   AST 14 0 - 40 IU/L   ALT 14 0 - 32 IU/L  Lipid Panel w/o Chol/HDL Ratio  Result Value Ref Range   Cholesterol, Total 105 100 - 199 mg/dL   Triglycerides 102 0 - 149 mg/dL   HDL 49 >39 mg/dL   VLDL Cholesterol Cal 19 5 - 40 mg/dL   LDL Chol Calc (NIH) 37 0 - 99 mg/dL      Assessment & Plan:   Problem List Items Addressed This Visit       Cardiovascular and Mediastinum   Hypertension associated with diabetes ([place])    Chronic, stable with BP below goal on home readings and in office today.  Continue current medication regimen and adjust as needed.  CMP next visit.  Continue to monitor BP at home regularly and focus on DASH diet + regular exercise.  Return in 3 months.        Relevant Medications   Semaglutide, 2 MG/DOSE, 8 MG/3ML SOPN     Digestive   Benign neoplasm of transverse colon    Highly recommend she return for colonoscopy as was recommended by GI.  Discussed at length with her fears she has.  She refuses and reports if CA present she would not pursue treatment.  Discussed Cologuard option for screening, which she wishes to think about.       GERD (gastroesophageal reflux disease)    Chronic, stable at this time.  Obtain mag level yearly, will obtain next visit.  Continue current medication regimen and collaboration with GI team.           Endocrine   Hyperlipidemia associated with type 2 diabetes mellitus (HCC)    Chronic, ongoing.  Recent lipid panel with LDL 83.  Continue current medication regimen and adjust as needed.  Lipid panel up to date.       Relevant Medications   Semaglutide, 2 MG/DOSE, 8 MG/3ML SOPN   Uncontrolled type 2 diabetes mellitus ([place]) - Primary    Chronic, ongoing with A1C today 8% and urine ALB 30 in March 2022, continue ARB for kidney protection.  At this time continue Ozempic and Jardiance, she is at max dose of each -- just started 2 MG Ozempic  dosing.   If ongoing elevation next visit could consider sulfonylurea at very low dose, discussed with patient, due to lower cost and wishes to avoid insulin.  Avoid Metformin as did not tolerate in past.  At this time she would prefer not to add additional medication and would like to focus on diet and regular exercise.  Recommend continue to check BS daily and return to regular exercise and diet focus to maintain A1C <7.  Return in 3 months.       Relevant Medications   Semaglutide, 2 MG/DOSE, 8 MG/3ML SOPN   Other Relevant Orders   Bayer DCA Hb A1c Waived   Basic metabolic panel     Other   BMI 28.0-28.9,adult    BMI 28.79.  Recommended eating smaller high protein, low fat meals more frequently and exercising 30 mins a day 5 times a week with a goal of 10-15lb weight loss  in the next 3 months. Patient voiced their understanding and motivation to adhere to these recommendations.        Red pinnae of both ears    Intermittent with some dry skin present, ?dermatitis.  Recommend placing moisturizer to ears daily and monitor.  If any worsening symptoms return to office.         Follow up plan: Return in about 3 months (around 08/31/2021) for T2DM, HTN/HLD, GERD -- check Mag level and needs PPSV23.

## 2021-05-31 NOTE — Patient Instructions (Signed)
Diabetes Mellitus and Nutrition, Adult When you have diabetes, or diabetes mellitus, it is very important to have healthy eating habits because your blood sugar (glucose) levels are greatly affected by what you eat and drink. Eating healthy foods in the right amounts, at about the same times every day, can help you:  Control your blood glucose.  Lower your risk of heart disease.  Improve your blood pressure.  Reach or maintain a healthy weight. What can affect my meal plan? Every person with diabetes is different, and each person has different needs for a meal plan. Your health care provider may recommend that you work with a dietitian to make a meal plan that is best for you. Your meal plan may vary depending on factors such as:  The calories you need.  The medicines you take.  Your weight.  Your blood glucose, blood pressure, and cholesterol levels.  Your activity level.  Other health conditions you have, such as heart or kidney disease. How do carbohydrates affect me? Carbohydrates, also called carbs, affect your blood glucose level more than any other type of food. Eating carbs naturally raises the amount of glucose in your blood. Carb counting is a method for keeping track of how many carbs you eat. Counting carbs is important to keep your blood glucose at a healthy level, especially if you use insulin or take certain oral diabetes medicines. It is important to know how many carbs you can safely have in each meal. This is different for every person. Your dietitian can help you calculate how many carbs you should have at each meal and for each snack. How does alcohol affect me? Alcohol can cause a sudden decrease in blood glucose (hypoglycemia), especially if you use insulin or take certain oral diabetes medicines. Hypoglycemia can be a life-threatening condition. Symptoms of hypoglycemia, such as sleepiness, dizziness, and confusion, are similar to symptoms of having too much  alcohol.  Do not drink alcohol if: ? Your health care provider tells you not to drink. ? You are pregnant, may be pregnant, or are planning to become pregnant.  If you drink alcohol: ? Do not drink on an empty stomach. ? Limit how much you use to:  0-1 drink a day for women.  0-2 drinks a day for men. ? Be aware of how much alcohol is in your drink. In the [place] one drink equals one 12 oz bottle of beer (355 mL), one 5 oz glass of wine (148 mL), or one 1 oz glass of hard liquor (44 mL). ? Keep yourself hydrated with water, diet soda, or unsweetened iced tea.  Keep in mind that regular soda, juice, and other mixers may contain a lot of sugar and must be counted as carbs. What are tips for following this plan? Reading food labels  Start by checking the serving size on the "Nutrition Facts" label of packaged foods and drinks. The amount of calories, carbs, fats, and other nutrients listed on the label is based on one serving of the item. Many items contain more than one serving per package.  Check the total grams (g) of carbs in one serving. You can calculate the number of servings of carbs in one serving by dividing the total carbs by 15. For example, if a food has 30 g of total carbs per serving, it would be equal to 2 servings of carbs.  Check the number of grams (g) of saturated fats and trans fats in one serving. Choose foods that have   a low amount or none of these fats.  Check the number of milligrams (mg) of salt (sodium) in one serving. Most people should limit total sodium intake to less than 2,300 mg per day.  Always check the nutrition information of foods labeled as "low-fat" or "nonfat." These foods may be higher in added sugar or refined carbs and should be avoided.  Talk to your dietitian to identify your daily goals for nutrients listed on the label. Shopping  Avoid buying canned, pre-made, or processed foods. These foods tend to be high in fat, sodium, and added  sugar.  Shop around the outside edge of the grocery store. This is where you will most often find fresh fruits and vegetables, bulk grains, fresh meats, and fresh dairy. Cooking  Use low-heat cooking methods, such as baking, instead of high-heat cooking methods like deep frying.  Cook using healthy oils, such as olive, canola, or sunflower oil.  Avoid cooking with butter, cream, or high-fat meats. Meal planning  Eat meals and snacks regularly, preferably at the same times every day. Avoid going long periods of time without eating.  Eat foods that are high in fiber, such as fresh fruits, vegetables, beans, and whole grains. Talk with your dietitian about how many servings of carbs you can eat at each meal.  Eat 4-6 oz (112-168 g) of lean protein each day, such as lean meat, chicken, fish, eggs, or tofu. One ounce (oz) of lean protein is equal to: ? 1 oz (28 g) of meat, chicken, or fish. ? 1 egg. ?  cup (62 g) of tofu.  Eat some foods each day that contain healthy fats, such as avocado, nuts, seeds, and fish.   What foods should I eat? Fruits Berries. Apples. Oranges. Peaches. Apricots. Plums. Grapes. Mango. Papaya. Pomegranate. Kiwi. Cherries. Vegetables Lettuce. Spinach. Leafy greens, including kale, chard, collard greens, and mustard greens. Beets. Cauliflower. Cabbage. Broccoli. Carrots. Green beans. Tomatoes. Peppers. Onions. Cucumbers. Brussels sprouts. Grains Whole grains, such as whole-wheat or whole-grain bread, crackers, tortillas, cereal, and pasta. Unsweetened oatmeal. Quinoa. Brown or wild rice. Meats and other proteins Seafood. Poultry without skin. Lean cuts of poultry and beef. Tofu. Nuts. Seeds. Dairy Low-fat or fat-free dairy products such as milk, yogurt, and cheese. The items listed above may not be a complete list of foods and beverages you can eat. Contact a dietitian for more information. What foods should I avoid? Fruits Fruits canned with  syrup. Vegetables Canned vegetables. Frozen vegetables with butter or cream sauce. Grains Refined white flour and flour products such as bread, pasta, snack foods, and cereals. Avoid all processed foods. Meats and other proteins Fatty cuts of meat. Poultry with skin. Breaded or fried meats. Processed meat. Avoid saturated fats. Dairy Full-fat yogurt, cheese, or milk. Beverages Sweetened drinks, such as soda or iced tea. The items listed above may not be a complete list of foods and beverages you should avoid. Contact a dietitian for more information. Questions to ask a health care provider  Do I need to meet with a diabetes educator?  Do I need to meet with a dietitian?  What number can I call if I have questions?  When are the best times to check my blood glucose? Where to find more information:  American Diabetes Association: diabetes.org  Academy of Nutrition and Dietetics: www.eatright.org  National Institute of Diabetes and Digestive and Kidney Diseases: www.niddk.nih.gov  Association of Diabetes Care and Education Specialists: www.diabeteseducator.org Summary  It is important to have healthy eating   habits because your blood sugar (glucose) levels are greatly affected by what you eat and drink.  A healthy meal plan will help you control your blood glucose and maintain a healthy lifestyle.  Your health care provider may recommend that you work with a dietitian to make a meal plan that is best for you.  Keep in mind that carbohydrates (carbs) and alcohol have immediate effects on your blood glucose levels. It is important to count carbs and to use alcohol carefully. This information is not intended to replace advice given to you by your health care provider. Make sure you discuss any questions you have with your health care provider. Document Revised: 11/01/2019 Document Reviewed: 11/01/2019 Elsevier Patient Education  2021 Elsevier Inc.  

## 2021-05-31 NOTE — Assessment & Plan Note (Signed)
BMI 28.79.  Recommended eating smaller high protein, low fat meals more frequently and exercising 30 mins a day 5 times a week with a goal of 10-15lb weight loss in the next 3 months. Patient voiced their understanding and motivation to adhere to these recommendations.  

## 2021-06-01 DIAGNOSIS — E876 Hypokalemia: Secondary | ICD-10-CM | POA: Insufficient documentation

## 2021-06-01 LAB — BASIC METABOLIC PANEL
BUN/Creatinine Ratio: 15 (ref 12–28)
BUN: 10 mg/dL (ref 8–27)
CO2: 23 mmol/L (ref 20–29)
Calcium: 9.3 mg/dL (ref 8.7–10.3)
Chloride: 101 mmol/L (ref 96–106)
Creatinine, Ser: 0.67 mg/dL (ref 0.57–1.00)
Glucose: 190 mg/dL — ABNORMAL HIGH (ref 65–99)
Potassium: 3 mmol/L — ABNORMAL LOW (ref 3.5–5.2)
Sodium: 141 mmol/L (ref 134–144)
eGFR: 96 mL/min/{1.73_m2} (ref >59)

## 2021-06-01 MED ORDER — POTASSIUM CHLORIDE CRYS ER 20 MEQ PO TBCR
20.0000 meq | EXTENDED_RELEASE_TABLET | Freq: Every day | ORAL | 0 refills | Status: DC
Start: 2021-06-01 — End: 2021-09-06

## 2021-06-01 NOTE — Progress Notes (Signed)
Contacted via [place] -- please ensure she received message as she needs lab only visit for one week scheduled.  Thanks. Good morning Christine Booth, your labs have returned and kidney function remains stable, creatinine and eGFR.  Potassium, however, is on low side and lower then previous check.  Please stop taking Hydrochlorothiazide and I am sending in a potassium supplement for you to take for 5 days.  Also please ensure getting good potassium rich foods in diet, like dry fruit, mangoes, bananas -- do so in small amounts as some may push sugar levels up too.  Please call Monday, or my staff will call you, to schedule lab visit only for one week to recheck potassium.  Any questions? Keep being awesome!!  Thank you for allowing me to participate in your care.  I appreciate you. Kindest regards, Cedric Denison

## 2021-06-01 NOTE — Addendum Note (Signed)
Addended by: Marnee Guarneri T on: 06/01/2021 09:02 AM   Modules accepted: Orders

## 2021-06-05 ENCOUNTER — Telehealth: Payer: Self-pay

## 2021-06-05 NOTE — Telephone Encounter (Signed)
8 boxes of Ozempic received for the patient. Called and notified her that it was ready to be picked up.

## 2021-06-06 ENCOUNTER — Telehealth: Payer: Self-pay | Admitting: Pharmacist

## 2021-06-06 NOTE — Chronic Care Management (AMB) (Signed)
    Chronic Care Management Pharmacy Assistant   Name: Euphemia Lingerfelt  MRN: [id number] DOB: 07-03-1956  Reason for Encounter: Chart review   Medications: Outpatient Encounter Medications as of 06/06/2021  Medication Sig   calcium carbonate (OSCAL) 1500 (600 Ca) MG TABS tablet Take 600 mg of elemental calcium by mouth daily.   empagliflozin (JARDIANCE) 25 MG TABS tablet Take 25 mg by mouth daily.   fluticasone (FLONASE) 50 MCG/ACT nasal spray SPRAY 2 SPRAYS INTO EACH NOSTRIL EVERY DAY   glucose blood test strip Use as instructed   meloxicam (MOBIC) 15 MG tablet TAKE 1 TABLET BY MOUTH EVERY DAY   methocarbamol (ROBAXIN) 500 MG tablet methocarbamol 500 mg tablet  TAKE 1 TABLET 3 TIMES A DAY BY ORAL ROUTE AS NEEDED.   olmesartan (BENICAR) 40 MG tablet Take 1 tablet (40 mg total) by mouth daily.   pantoprazole (PROTONIX) 40 MG tablet Take 1 tablet (40 mg total) by mouth daily.   potassium chloride SA (KLOR-CON) 20 MEQ tablet Take 1 tablet (20 mEq total) by mouth daily for 5 days.   rosuvastatin (CRESTOR) 40 MG tablet TAKE 1 TABLET BY MOUTH EVERY DAY   Semaglutide, 2 MG/DOSE, 8 MG/3ML SOPN Inject 2 mg into the skin once a week.   sennosides-docusate sodium (SENOKOT-S) 8.6-50 MG tablet Take 1 tablet by mouth daily as needed for constipation.   triamcinolone (KENALOG) 0.025 % cream APPLY TO AFFECTED AREA TWICE A DAY   [START ON 11/28/2021] Zoster Vaccine Adjuvanted [facility]) injection Inject 0.5 mLs into the muscle once for 1 dose. Dose # 2   No facility-administered encounter medications on file as of 06/06/2021.    Reviewed chart for medication changes and adherence.  Recent OV, Consult or Hospital visit:  05/31/21 Marnee Guarneri, NP PCP- Seen for general follow up. Return in 3 months. No medication changes indicated  No gaps in adherence identified. Patient has follow up scheduled with pharmacy team. No further action required.  Lizbeth Bark Clinical Pharmacist  Assistant [phone]

## 2021-06-12 ENCOUNTER — Other Ambulatory Visit: Payer: Medicare Other

## 2021-06-12 ENCOUNTER — Other Ambulatory Visit: Payer: Self-pay

## 2021-06-12 DIAGNOSIS — E876 Hypokalemia: Secondary | ICD-10-CM

## 2021-06-12 NOTE — Telephone Encounter (Signed)
All medication picked up by the patient.

## 2021-06-13 LAB — POTASSIUM: Potassium: 3.5 mmol/L (ref 3.5–5.2)

## 2021-06-13 NOTE — Progress Notes (Signed)
Contacted via [place] morning [name], your potassium level has returned and is improved.  I would stay of Hydrochlorothiazide at this time and we will recheck levels next visit.  If blood pressures are consistently greater then 130/80 please let me know as we may need to adjust some things.   Keep being amazing!!  Thank you for allowing me to participate in your care.  I appreciate you. Kindest regards, Melik Blancett

## 2021-06-21 ENCOUNTER — Other Ambulatory Visit: Payer: Self-pay | Admitting: Nurse Practitioner

## 2021-06-21 MED ORDER — SPIRONOLACTONE 25 MG PO TABS: 25.0000 mg | ORAL_TABLET | Freq: Every day | ORAL | 4 refills | Status: DC

## 2021-07-05 ENCOUNTER — Encounter: Payer: Self-pay | Admitting: Nurse Practitioner

## 2021-07-18 ENCOUNTER — Other Ambulatory Visit: Payer: Self-pay | Admitting: Nurse Practitioner

## 2021-07-18 NOTE — Telephone Encounter (Signed)
Per last office visit- continue current GI regimen- rx has expired- will give #90 until next visit

## 2021-07-19 ENCOUNTER — Other Ambulatory Visit: Payer: Self-pay | Admitting: Nurse Practitioner

## 2021-07-19 DIAGNOSIS — I152 Hypertension secondary to endocrine disorders: Secondary | ICD-10-CM

## 2021-07-19 DIAGNOSIS — E1165 Type 2 diabetes mellitus with hyperglycemia: Secondary | ICD-10-CM

## 2021-07-19 NOTE — Telephone Encounter (Signed)
Requested medications are due for refill today N/A  Requested medications are on the active medication list No, no longer on med list  Last refill 04/21/21  Last visit 05/31/21  Future visit scheduled 11/11/21  Notes to clinic Med not on current med list.

## 2021-07-22 ENCOUNTER — Telehealth: Payer: Self-pay

## 2021-07-22 NOTE — Telephone Encounter (Signed)
Copied from [place] [id number]. Topic: Appointment Scheduling - Scheduling Inquiry for Clinic >> Jul 22, 2021 10:19 AM Greggory Keen D wrote: Reason for CRM: Pt would like to rsch her medicare wellness visit  CB#  [phone]

## 2021-07-31 ENCOUNTER — Other Ambulatory Visit: Payer: Self-pay | Admitting: Nurse Practitioner

## 2021-07-31 NOTE — Telephone Encounter (Signed)
Requested Prescriptions  Pending Prescriptions Disp Refills  . fluticasone (FLONASE) 50 MCG/ACT nasal spray [Pharmacy Med Name: FLUTICASONE PROP 50 MCG SPRAY] 48 mL 0    Sig: SPRAY 2 SPRAYS INTO EACH NOSTRIL EVERY DAY     Ear, Nose, and Throat: Nasal Preparations - Corticosteroids Passed - 07/31/2021  5:50 AM      Passed - Valid encounter within last 12 months    Recent Outpatient Visits          2 months ago Uncontrolled type 2 diabetes mellitus with hyperglycemia ([place])   [place], Jolene T, NP   5 months ago Uncontrolled type 2 diabetes mellitus with hyperglycemia ([place])   [place], Gilby T, NP   1 year ago Encounter for annual physical exam   [place], Henrine Screws T, NP   1 year ago Uncontrolled type 2 diabetes mellitus with hyperglycemia ([place])   [place] [facility], Jolene T, NP   1 year ago Uncontrolled type 2 diabetes mellitus with hyperglycemia ([place])   [place], Barbaraann Faster, NP      Future Appointments            In 1 month [place], Barbaraann Faster, NP [facility]   In 3 months  [facility]

## 2021-08-20 NOTE — Addendum Note (Signed)
Addended by: Marnee Guarneri T on: 08/20/2021 08:07 AM   Modules accepted: Orders

## 2021-08-30 ENCOUNTER — Ambulatory Visit: Payer: Medicare Other | Admitting: Nurse Practitioner

## 2021-09-02 ENCOUNTER — Telehealth: Payer: Self-pay

## 2021-09-02 NOTE — Telephone Encounter (Signed)
Copied from [place] [id number]. Topic: Referral - Request for Referral >> Aug 30, 2021 10:06 AM Scherrie Gerlach wrote: [facility] needs a bone density order for the pt sent over to them via fax.  Fax [phone]

## 2021-09-03 NOTE — Telephone Encounter (Signed)
Order signed and faxed to [phone].

## 2021-09-04 ENCOUNTER — Telehealth: Payer: Self-pay

## 2021-09-04 NOTE — Progress Notes (Signed)
Chronic Care Management Pharmacy Assistant   Name: Christine Booth  MRN: [id number] DOB: Dec 14, 1955   Reason for Encounter: Disease State Diabetes    Recent office visits:  05/31/21 Christine Lick NP - Seen for diabetes - Labs ordered -   Recent consult visits:  None noted   Hospital visits:  None in previous 6 months  Medications: Outpatient Encounter Medications as of 09/04/2021  Medication Sig   calcium carbonate (OSCAL) 1500 (600 Ca) MG TABS tablet Take 600 mg of elemental calcium by mouth daily.   empagliflozin (JARDIANCE) 25 MG TABS tablet Take 25 mg by mouth daily.   fluticasone (FLONASE) 50 MCG/ACT nasal spray SPRAY 2 SPRAYS INTO EACH NOSTRIL EVERY DAY   glucose blood test strip Use as instructed   meloxicam (MOBIC) 15 MG tablet TAKE 1 TABLET BY MOUTH EVERY DAY   methocarbamol (ROBAXIN) 500 MG tablet methocarbamol 500 mg tablet  TAKE 1 TABLET 3 TIMES A DAY BY ORAL ROUTE AS NEEDED.   olmesartan (BENICAR) 40 MG tablet Take 1 tablet (40 mg total) by mouth daily.   pantoprazole (PROTONIX) 40 MG tablet TAKE 1 TABLET BY MOUTH EVERY DAY   potassium chloride SA (KLOR-CON) 20 MEQ tablet Take 1 tablet (20 mEq total) by mouth daily for 5 days.   rosuvastatin (CRESTOR) 40 MG tablet TAKE 1 TABLET BY MOUTH EVERY DAY   Semaglutide, 2 MG/DOSE, 8 MG/3ML SOPN Inject 2 mg into the skin once a week.   sennosides-docusate sodium (SENOKOT-S) 8.6-50 MG tablet Take 1 tablet by mouth daily as needed for constipation.   spironolactone (ALDACTONE) 25 MG tablet Take 1 tablet (25 mg total) by mouth daily.   triamcinolone (KENALOG) 0.025 % cream APPLY TO AFFECTED AREA TWICE A DAY   [START ON 11/28/2021] Zoster Vaccine Adjuvanted [facility]) injection Inject 0.5 mLs into the muscle once for 1 dose. Dose # 2   No facility-administered encounter medications on file as of 09/04/2021.    Care Gaps:  Recent Relevant Labs: Lab Results  Component Value Date/Time   HGBA1C 8.0 (H) 05/31/2021  09:54 AM   HGBA1C 8.4 (H) 03/01/2021 09:41 AM   MICROALBUR 30 (H) 03/01/2021 09:41 AM   MICROALBUR 10 02/27/2020 08:43 AM    Kidney Function Lab Results  Component Value Date/Time   CREATININE 0.67 05/31/2021 09:57 AM   CREATININE 0.69 03/01/2021 09:44 AM   GFRNONAA 88 05/29/2020 09:35 AM   GFRAA 102 05/29/2020 09:35 AM    Current antihyperglycemic regimen:  Jardiance 25 mg  Ozempic 2 mg  What recent interventions/DTPs have been made to improve glycemic control:  N/a Have there been any recent hospitalizations or ED visits since last visit with CPP? No Patient denies hypoglycemic symptoms, including Pale, Sweaty, Shaky, Hungry, Nervous/irritable, and Vision changes Patient denies hyperglycemic symptoms, including blurry vision, excessive thirst, fatigue, polyuria, and weakness How often are you checking your blood sugar? once daily and in the morning before eating or drinking What are your blood sugars ranging?  Fasting: 9/28/ @169   Before meals: N/a After meals: N/a Bedtime: N/a During the week, how often does your blood glucose drop below 70? Never Are you checking your feet daily/regularly? Patient states that she checks her feet everyday.   Adherence Review: Is the patient currently on a STATIN medication? Yes Is the patient currently on ACE/ARB medication? Yes Does the patient have >5 day gap between last estimated fill dates? No  Misc. Comment: Ms Christine Booth states that she works on Mondays when  [name] is in the [place] office. Patient states that she prefers a Friday appointment. Discussed this with CPP,  Unable to schedule patient an appointment with [name] at this time but will reach out to her to schedule at a later time.   Star Rating Drugs: Jardiance 25 mg last fill 03/26/21 90 DS  Olmesartan 40 mg last fill 07/31/21 90 DS  Rosuvastatin 40 mg last fill 08/17/21 90 DS  Ozempic 2 mg last fill 05/31/21  [name], CMA

## 2021-09-06 ENCOUNTER — Ambulatory Visit (INDEPENDENT_AMBULATORY_CARE_PROVIDER_SITE_OTHER): Payer: Medicare Other | Admitting: Nurse Practitioner

## 2021-09-06 ENCOUNTER — Encounter: Payer: Self-pay | Admitting: Nurse Practitioner

## 2021-09-06 ENCOUNTER — Other Ambulatory Visit: Payer: Self-pay

## 2021-09-06 VITALS — BP 136/82 | HR 78 | Temp 98.8°F | Ht 61.0 in | Wt 158.8 lb

## 2021-09-06 DIAGNOSIS — E876 Hypokalemia: Secondary | ICD-10-CM | POA: Diagnosis not present

## 2021-09-06 DIAGNOSIS — I152 Hypertension secondary to endocrine disorders: Secondary | ICD-10-CM | POA: Diagnosis not present

## 2021-09-06 DIAGNOSIS — K219 Gastro-esophageal reflux disease without esophagitis: Secondary | ICD-10-CM | POA: Diagnosis not present

## 2021-09-06 DIAGNOSIS — E1165 Type 2 diabetes mellitus with hyperglycemia: Secondary | ICD-10-CM | POA: Diagnosis not present

## 2021-09-06 DIAGNOSIS — E538 Deficiency of other specified B group vitamins: Secondary | ICD-10-CM | POA: Diagnosis not present

## 2021-09-06 DIAGNOSIS — E785 Hyperlipidemia, unspecified: Secondary | ICD-10-CM

## 2021-09-06 DIAGNOSIS — E1169 Type 2 diabetes mellitus with other specified complication: Secondary | ICD-10-CM | POA: Diagnosis not present

## 2021-09-06 DIAGNOSIS — Z6830 Body mass index (BMI) 30.0-30.9, adult: Secondary | ICD-10-CM

## 2021-09-06 DIAGNOSIS — E66811 Obesity, class 1: Secondary | ICD-10-CM

## 2021-09-06 DIAGNOSIS — Z23 Encounter for immunization: Secondary | ICD-10-CM | POA: Diagnosis not present

## 2021-09-06 DIAGNOSIS — E6609 Other obesity due to excess calories: Secondary | ICD-10-CM

## 2021-09-06 DIAGNOSIS — E1159 Type 2 diabetes mellitus with other circulatory complications: Secondary | ICD-10-CM

## 2021-09-06 DIAGNOSIS — Z79899 Other long term (current) drug therapy: Secondary | ICD-10-CM | POA: Diagnosis not present

## 2021-09-06 LAB — BAYER DCA HB A1C WAIVED: HB A1C (BAYER DCA - WAIVED): 7.5 % — ABNORMAL HIGH (ref 4.8–5.6)

## 2021-09-06 MED ORDER — AMLODIPINE BESYLATE 2.5 MG PO TABS: 2.5000 mg | ORAL_TABLET | Freq: Every day | ORAL | 4 refills | Status: DC

## 2021-09-06 NOTE — Assessment & Plan Note (Signed)
Chronic, ongoing with BP above goal with discontinuation of HCTZ due to hypokalemia.  Spironolactone offering no benefit, discontinue this.  Continue Olmesartan at current dosing and add on Amlodipine 2.5 MG daily.  BMP and TSH today.  Continue to monitor BP at home regularly and focus on DASH diet + regular exercise.  Return in 3 months.

## 2021-09-06 NOTE — Assessment & Plan Note (Signed)
Chronic, ongoing with A1C today 7.5% (downward trend) and urine ALB 30 in March 2022, continue ARB for kidney protection.  At this time continue Ozempic and Jardiance, she is at max dose of each.   If ongoing elevation next visit could consider sulfonylurea at very low dose, discussed with patient, due to lower cost and wishes to avoid insulin.  Avoid Metformin as did not tolerate in past.  At this time she would prefer not to add additional medication and would like to focus on diet and regular exercise.  Recommend continue to check BS daily and return to regular exercise and diet focus to maintain A1C <7.  Return in 3 months.

## 2021-09-06 NOTE — Progress Notes (Signed)
BP 136/82 (BP Location: Left Arm, Patient Position: Sitting, Cuff Size: Normal)   Pulse 78   Temp 98.8 F (37.1 C) (Oral)   Ht 5\' 1"  (1.549 m)   Wt 158 lb 12.8 oz (72 kg)   LMP  (LMP Unknown)   SpO2 98%   BMI 30.00 kg/m    Subjective:    Patient ID: Korine Winton, female    DOB: 1956/08/10, 65 y.o.   MRN: [id number]  HPI: Moriyah Byington is a 65 y.o. female  Chief Complaint  Patient presents with   Diabetes   Hypertension   Hyperlipidemia   Gastroesophageal Reflux   DIABETES Taking Jardiance 25 MG daily and Ozempic 2 MG weekly (increased at that time), she is working with CCM team on cost of medications. Last visit A1C 8% in June.  She does endorse enjoying sweets -- too uch with stressors.   Did take Metformin initially, caused some constipation.    Last colonoscopy was in 2018, she was to repeat in one year but did not attend this.  Reports fears about this, which PCP discussed at length with her importance of repeat screening to ensure no cancerous polyps present.  She reports wishes for no treatment if something were present.  Educated her on colon CA.   Hypoglycemic episodes:no Polydipsia/polyuria: no Visual disturbance: no Chest pain: no Paresthesias: no Glucose Monitoring: yes             Accucheck frequency: once every couple weeks             Fasting glucose: 160 range at home             Post prandial:             Evening:             Before meals: Taking Insulin?: no             Long acting insulin:             Short acting insulin: Blood Pressure Monitoring: daily Retinal Examination: Up To Date  Foot Exam: Up to Date Pneumovax: Up to Date Influenza: Up to Date Aspirin: yes    HYPERTENSION / HYPERLIPIDEMIA Taking Olmesartan 40 MG daily and Rosuvastatin 40 MG daily.  Stopped HCTZ due to low K+ at last visit, this showed improvement with discontinuation of HCTZ -- we added Spironolactone but offering no benefit. Satisfied with current  treatment? yes Duration of hypertension: chronic BP monitoring frequency: once every two weeks BP range: 140-80 range at home BP medication side effects: no Duration of hyperlipidemia: chronic Cholesterol medication side effects: no Cholesterol supplements: none Medication compliance: good compliance Aspirin: yes Recent stressors: no Recurrent headaches: occasional, lots of stress at work over past 3 months Visual changes: no Palpitations: no Dyspnea: no Chest pain: no Lower extremity edema: no Dizzy/lightheaded: no    GERD Continues on daily Protonix daily.   GERD control status: stable  Satisfied with current treatment? yes Heartburn frequency: none Medication side effects: no  Medication compliance: stable Dysphagia: no Odynophagia:  no Hematemesis: no Blood in stool: no EGD: yes  ANXIETY/STRESS Endorses increased stressors with work. Duration:uncontrolled Anxious mood: yes  Excessive worrying: yes Irritability: yes  Sweating: no Nausea: no Palpitations:no Hyperventilation: no Panic attacks: no Agoraphobia: no  Obscessions/compulsions: no Depressed mood: no Depression screen [facility] 2/9 09/06/2021 11/09/2020 05/29/2020 05/04/2019 01/22/2018  Decreased Interest 1 0 0 0 0  Down, Depressed, Hopeless 1 0 0 0 0  PHQ - 2 Score 2 0 0 0 0  Altered sleeping 0 - - 0 0  Tired, decreased energy 1 - - 0 0  Change in appetite 1 - - 0 0  Feeling bad or failure about yourself  0 - - 0 0  Trouble concentrating 0 - - 0 0  Moving slowly or fidgety/restless 0 - - 0 0  Suicidal thoughts 0 - - 0 0  PHQ-9 Score 4 - - 0 0   Anhedonia: no Weight changes: no Insomnia: none Hypersomnia: no Fatigue/loss of energy: no Feelings of worthlessness: no Feelings of guilt: no Impaired concentration/indecisiveness: yes Suicidal ideations: no  Crying spells: no Recent Stressors/Life Changes: yes   Relationship problems: no   Family stress: no     Financial stress: no    Job stress: yes     Recent death/loss: no  GAD 7 : Generalized Anxiety Score 09/06/2021 05/04/2019  Nervous, Anxious, on Edge 1 0  Control/stop worrying 0 0  Worry too much - different things 1 0  Trouble relaxing 0 0  Restless 0 0  Easily annoyed or irritable 2 0  Afraid - awful might happen 1 0  Total GAD 7 Score 5 0  Anxiety Difficulty Somewhat difficult -   Relevant past medical, surgical, family and social history reviewed and updated as indicated. Interim medical history since our last visit reviewed. Allergies and medications reviewed and updated.  Review of Systems  Constitutional:  Negative for activity change, appetite change, diaphoresis, fatigue and fever.  Respiratory:  Negative for cough, chest tightness and shortness of breath.   Cardiovascular:  Negative for chest pain, palpitations and leg swelling.  Gastrointestinal: Negative.   Endocrine: Negative for cold intolerance, heat intolerance, polydipsia, polyphagia and polyuria.  Neurological: Negative.   Psychiatric/Behavioral: Negative.     Per HPI unless specifically indicated above     Objective:    BP 136/82 (BP Location: Left Arm, Patient Position: Sitting, Cuff Size: Normal)   Pulse 78   Temp 98.8 F (37.1 C) (Oral)   Ht 5\' 1"  (1.549 m)   Wt 158 lb 12.8 oz (72 kg)   LMP  (LMP Unknown)   SpO2 98%   BMI 30.00 kg/m   Wt Readings from Last 3 Encounters:  09/06/21 158 lb 12.8 oz (72 kg)  05/31/21 157 lb 6.4 oz (71.4 kg)  03/01/21 160 lb 9.6 oz (72.8 kg)    Physical Exam Vitals and nursing note reviewed.  Constitutional:      General: She is awake. She is not in acute distress.    Appearance: She is well-developed, well-groomed and overweight. She is not ill-appearing.  HENT:     Head: Normocephalic.     Right Ear: Hearing, ear canal and external ear normal. No drainage.     Left Ear: Hearing, ear canal and external ear normal. No drainage.     Ears:     Comments:      Mouth/Throat:     Mouth: Mucous membranes are  moist.  Eyes:     General: Lids are normal.        Right eye: No discharge.        Left eye: No discharge.     Conjunctiva/sclera: Conjunctivae normal.     Pupils: Pupils are equal, round, and reactive to light.  Neck:     Thyroid: No thyromegaly.     Vascular: No carotid bruit.  Cardiovascular:     Rate and Rhythm: Normal rate and  regular rhythm.     Heart sounds: Normal heart sounds. No murmur heard.   No gallop.  Pulmonary:     Effort: Pulmonary effort is normal. No accessory muscle usage or respiratory distress.     Breath sounds: Normal breath sounds.  Abdominal:     General: Bowel sounds are normal.     Palpations: Abdomen is soft.  Musculoskeletal:     Cervical back: Normal range of motion and neck supple.     Right lower leg: No edema.     Left lower leg: No edema.  Skin:    General: Skin is warm and dry.  Neurological:     Mental Status: She is alert and oriented to person, place, and time.  Psychiatric:        Attention and Perception: Attention normal.        Mood and Affect: Mood normal.        Behavior: Behavior normal. Behavior is cooperative.        Thought Content: Thought content normal.        Judgment: Judgment normal.   Results for orders placed or performed in visit on 06/12/21  Potassium  Result Value Ref Range   Potassium 3.5 3.5 - 5.2 mmol/L      Assessment & Plan:   Problem List Items Addressed This Visit       Cardiovascular and Mediastinum   Hypertension associated with diabetes ([place])    Chronic, ongoing with BP above goal with discontinuation of HCTZ due to hypokalemia.  Spironolactone offering no benefit, discontinue this.  Continue Olmesartan at current dosing and add on Amlodipine 2.5 MG daily.  BMP and TSH today.  Continue to monitor BP at home regularly and focus on DASH diet + regular exercise.  Return in 3 months.      Relevant Medications   amLODipine (NORVASC) 2.5 MG tablet   Other Relevant Orders   Bayer DCA Hb A1c Waived    Basic metabolic panel   TSH     Digestive   GERD (gastroesophageal reflux disease)    Chronic, stable at this time.  Obtain mag level yearly, will obtain today.  Continue current medication regimen and collaboration with GI team.        Relevant Orders   Magnesium     Endocrine   Hyperlipidemia associated with type 2 diabetes mellitus (HCC)    Chronic, ongoing.  Recent lipid panel with LDL 83.  Continue current medication regimen and adjust as needed.  Lipid panel today.      Relevant Medications   amLODipine (NORVASC) 2.5 MG tablet   Other Relevant Orders   Bayer DCA Hb A1c Waived   Lipid Panel w/o Chol/HDL Ratio   Uncontrolled type 2 diabetes mellitus ([place]) - Primary    Chronic, ongoing with A1C today 7.5% (downward trend) and urine ALB 30 in March 2022, continue ARB for kidney protection.  At this time continue Ozempic and Jardiance, she is at max dose of each.   If ongoing elevation next visit could consider sulfonylurea at very low dose, discussed with patient, due to lower cost and wishes to avoid insulin.  Avoid Metformin as did not tolerate in past.  At this time she would prefer not to add additional medication and would like to focus on diet and regular exercise.  Recommend continue to check BS daily and return to regular exercise and diet focus to maintain A1C <7.  Return in 3 months.      Relevant  Orders   Bayer DCA Hb A1c Waived     Other   Obesity    BMI 30.  Recommended eating smaller high protein, low fat meals more frequently and exercising 30 mins a day 5 times a week with a goal of 10-15lb weight loss in the next 3 months. Patient voiced their understanding and motivation to adhere to these recommendations.       Hypokalemia    Check BMP today.      Other Visit Diagnoses     B12 deficiency       History of low levels, check today.   Relevant Orders   Vitamin B12   Need for influenza vaccination       Relevant Orders   Flu Vaccine QUAD High Dose(Fluad)  (Completed)        Follow up plan: Return in about 3 months (around 12/06/2021) for T2DM, HTN/HLD, MOOD.

## 2021-09-06 NOTE — Assessment & Plan Note (Signed)
Chronic, ongoing.  Recent lipid panel with LDL 83.  Continue current medication regimen and adjust as needed.  Lipid panel today.

## 2021-09-06 NOTE — Assessment & Plan Note (Signed)
Chronic, stable at this time.  Obtain mag level yearly, will obtain today.  Continue current medication regimen and collaboration with GI team.

## 2021-09-06 NOTE — Assessment & Plan Note (Signed)
Check BMP today 

## 2021-09-06 NOTE — Assessment & Plan Note (Signed)
BMI 30.  Recommended eating smaller high protein, low fat meals more frequently and exercising 30 mins a day 5 times a week with a goal of 10-15lb weight loss in the next 3 months. Patient voiced their understanding and motivation to adhere to these recommendations.

## 2021-09-06 NOTE — Patient Instructions (Signed)
Diabetes Mellitus and Nutrition, Adult When you have diabetes, or diabetes mellitus, it is very important to have healthy eating habits because your blood sugar (glucose) levels are greatly affected by what you eat and drink. Eating healthy foods in the right amounts, at about the same times every day, can help you:  Control your blood glucose.  Lower your risk of heart disease.  Improve your blood pressure.  Reach or maintain a healthy weight. What can affect my meal plan? Every person with diabetes is different, and each person has different needs for a meal plan. Your health care provider may recommend that you work with a dietitian to make a meal plan that is best for you. Your meal plan may vary depending on factors such as:  The calories you need.  The medicines you take.  Your weight.  Your blood glucose, blood pressure, and cholesterol levels.  Your activity level.  Other health conditions you have, such as heart or kidney disease. How do carbohydrates affect me? Carbohydrates, also called carbs, affect your blood glucose level more than any other type of food. Eating carbs naturally raises the amount of glucose in your blood. Carb counting is a method for keeping track of how many carbs you eat. Counting carbs is important to keep your blood glucose at a healthy level, especially if you use insulin or take certain oral diabetes medicines. It is important to know how many carbs you can safely have in each meal. This is different for every person. Your dietitian can help you calculate how many carbs you should have at each meal and for each snack. How does alcohol affect me? Alcohol can cause a sudden decrease in blood glucose (hypoglycemia), especially if you use insulin or take certain oral diabetes medicines. Hypoglycemia can be a life-threatening condition. Symptoms of hypoglycemia, such as sleepiness, dizziness, and confusion, are similar to symptoms of having too much  alcohol.  Do not drink alcohol if: ? Your health care provider tells you not to drink. ? You are pregnant, may be pregnant, or are planning to become pregnant.  If you drink alcohol: ? Do not drink on an empty stomach. ? Limit how much you use to:  0-1 drink a day for women.  0-2 drinks a day for men. ? Be aware of how much alcohol is in your drink. In the [place] one drink equals one 12 oz bottle of beer (355 mL), one 5 oz glass of wine (148 mL), or one 1 oz glass of hard liquor (44 mL). ? Keep yourself hydrated with water, diet soda, or unsweetened iced tea.  Keep in mind that regular soda, juice, and other mixers may contain a lot of sugar and must be counted as carbs. What are tips for following this plan? Reading food labels  Start by checking the serving size on the "Nutrition Facts" label of packaged foods and drinks. The amount of calories, carbs, fats, and other nutrients listed on the label is based on one serving of the item. Many items contain more than one serving per package.  Check the total grams (g) of carbs in one serving. You can calculate the number of servings of carbs in one serving by dividing the total carbs by 15. For example, if a food has 30 g of total carbs per serving, it would be equal to 2 servings of carbs.  Check the number of grams (g) of saturated fats and trans fats in one serving. Choose foods that have   a low amount or none of these fats.  Check the number of milligrams (mg) of salt (sodium) in one serving. Most people should limit total sodium intake to less than 2,300 mg per day.  Always check the nutrition information of foods labeled as "low-fat" or "nonfat." These foods may be higher in added sugar or refined carbs and should be avoided.  Talk to your dietitian to identify your daily goals for nutrients listed on the label. Shopping  Avoid buying canned, pre-made, or processed foods. These foods tend to be high in fat, sodium, and added  sugar.  Shop around the outside edge of the grocery store. This is where you will most often find fresh fruits and vegetables, bulk grains, fresh meats, and fresh dairy. Cooking  Use low-heat cooking methods, such as baking, instead of high-heat cooking methods like deep frying.  Cook using healthy oils, such as olive, canola, or sunflower oil.  Avoid cooking with butter, cream, or high-fat meats. Meal planning  Eat meals and snacks regularly, preferably at the same times every day. Avoid going long periods of time without eating.  Eat foods that are high in fiber, such as fresh fruits, vegetables, beans, and whole grains. Talk with your dietitian about how many servings of carbs you can eat at each meal.  Eat 4-6 oz (112-168 g) of lean protein each day, such as lean meat, chicken, fish, eggs, or tofu. One ounce (oz) of lean protein is equal to: ? 1 oz (28 g) of meat, chicken, or fish. ? 1 egg. ?  cup (62 g) of tofu.  Eat some foods each day that contain healthy fats, such as avocado, nuts, seeds, and fish.   What foods should I eat? Fruits Berries. Apples. Oranges. Peaches. Apricots. Plums. Grapes. Mango. Papaya. Pomegranate. Kiwi. Cherries. Vegetables Lettuce. Spinach. Leafy greens, including kale, chard, collard greens, and mustard greens. Beets. Cauliflower. Cabbage. Broccoli. Carrots. Green beans. Tomatoes. Peppers. Onions. Cucumbers. Brussels sprouts. Grains Whole grains, such as whole-wheat or whole-grain bread, crackers, tortillas, cereal, and pasta. Unsweetened oatmeal. Quinoa. Brown or wild rice. Meats and other proteins Seafood. Poultry without skin. Lean cuts of poultry and beef. Tofu. Nuts. Seeds. Dairy Low-fat or fat-free dairy products such as milk, yogurt, and cheese. The items listed above may not be a complete list of foods and beverages you can eat. Contact a dietitian for more information. What foods should I avoid? Fruits Fruits canned with  syrup. Vegetables Canned vegetables. Frozen vegetables with butter or cream sauce. Grains Refined white flour and flour products such as bread, pasta, snack foods, and cereals. Avoid all processed foods. Meats and other proteins Fatty cuts of meat. Poultry with skin. Breaded or fried meats. Processed meat. Avoid saturated fats. Dairy Full-fat yogurt, cheese, or milk. Beverages Sweetened drinks, such as soda or iced tea. The items listed above may not be a complete list of foods and beverages you should avoid. Contact a dietitian for more information. Questions to ask a health care provider  Do I need to meet with a diabetes educator?  Do I need to meet with a dietitian?  What number can I call if I have questions?  When are the best times to check my blood glucose? Where to find more information:  American Diabetes Association: diabetes.org  Academy of Nutrition and Dietetics: www.eatright.org  National Institute of Diabetes and Digestive and Kidney Diseases: www.niddk.nih.gov  Association of Diabetes Care and Education Specialists: www.diabeteseducator.org Summary  It is important to have healthy eating   habits because your blood sugar (glucose) levels are greatly affected by what you eat and drink.  A healthy meal plan will help you control your blood glucose and maintain a healthy lifestyle.  Your health care provider may recommend that you work with a dietitian to make a meal plan that is best for you.  Keep in mind that carbohydrates (carbs) and alcohol have immediate effects on your blood glucose levels. It is important to count carbs and to use alcohol carefully. This information is not intended to replace advice given to you by your health care provider. Make sure you discuss any questions you have with your health care provider. Document Revised: 11/01/2019 Document Reviewed: 11/01/2019 Elsevier Patient Education  2021 Elsevier Inc.  

## 2021-09-07 LAB — LIPID PANEL W/O CHOL/HDL RATIO
Cholesterol, Total: 108 mg/dL (ref 100–199)
HDL: 49 mg/dL (ref >39)
LDL Chol Calc (NIH): 40 mg/dL (ref 0–99)
Triglycerides: 99 mg/dL (ref 0–149)
VLDL Cholesterol Cal: 19 mg/dL (ref 5–40)

## 2021-09-07 LAB — BASIC METABOLIC PANEL
BUN/Creatinine Ratio: 22 (ref 12–28)
BUN: 11 mg/dL (ref 8–27)
CO2: 19 mmol/L — ABNORMAL LOW (ref 20–29)
Calcium: 9.2 mg/dL (ref 8.7–10.3)
Chloride: 104 mmol/L (ref 96–106)
Creatinine, Ser: 0.51 mg/dL — ABNORMAL LOW (ref 0.57–1.00)
Glucose: 127 mg/dL — ABNORMAL HIGH (ref 70–99)
Potassium: 3.3 mmol/L — ABNORMAL LOW (ref 3.5–5.2)
Sodium: 141 mmol/L (ref 134–144)
eGFR: 103 mL/min/{1.73_m2} (ref >59)

## 2021-09-07 LAB — TSH: TSH: 1.21 u[IU]/mL (ref 0.450–4.500)

## 2021-09-07 LAB — MAGNESIUM: Magnesium: 2 mg/dL (ref 1.6–2.3)

## 2021-09-07 LAB — VITAMIN B12: Vitamin B-12: 419 pg/mL (ref 232–1245)

## 2021-09-07 NOTE — Progress Notes (Signed)
Contacted via MyChart   Good morning [name] your labs have returned: - Kidney function, creatinine and eGFR, is normal.  - Potassium level slightly on low side, could be related to a mix or diet and Jardiance (since also has a diuretic effect), continue ensuring a good potassium intake in diet and we will recheck next visit.  Also recommend a multivitamin daily. - Cholesterol levels at goal, continue statin. - Thyroid and magnesium levels normal - B12 level is on lower side of normal, this is important for nervous system health and I check often with my diabetics.  I would recommend taking a little Vitamin B12 500 MCG every other day -- this can help with brain fog and help nervous system health.  Any questions? Keep being amazing!!  Thank you for allowing me to participate in your care.  I appreciate you. Kindest regards, Blayke Cordrey

## 2021-09-13 DIAGNOSIS — Z1231 Encounter for screening mammogram for malignant neoplasm of breast: Secondary | ICD-10-CM | POA: Diagnosis not present

## 2021-09-13 DIAGNOSIS — Z780 Asymptomatic menopausal state: Secondary | ICD-10-CM | POA: Diagnosis not present

## 2021-09-13 LAB — HM MAMMOGRAPHY

## 2021-09-16 ENCOUNTER — Telehealth: Payer: Self-pay

## 2021-09-16 NOTE — Telephone Encounter (Signed)
Ozempic samples received.  Patient aware.

## 2021-10-15 ENCOUNTER — Encounter: Payer: Self-pay | Admitting: Nurse Practitioner

## 2021-10-16 ENCOUNTER — Other Ambulatory Visit: Payer: Self-pay | Admitting: Nurse Practitioner

## 2021-10-16 NOTE — Telephone Encounter (Signed)
Requested Prescriptions  Pending Prescriptions Disp Refills  . pantoprazole (PROTONIX) 40 MG tablet [Pharmacy Med Name: PANTOPRAZOLE SOD DR 40 MG TAB] 90 tablet 0    Sig: TAKE 1 TABLET BY MOUTH EVERY DAY     Gastroenterology: Proton Pump Inhibitors Passed - 10/16/2021  3:03 AM      Passed - Valid encounter within last 12 months    Recent Outpatient Visits          1 month ago Uncontrolled type 2 diabetes mellitus with hyperglycemia ([place])   [place], Jolene T, NP   4 months ago Uncontrolled type 2 diabetes mellitus with hyperglycemia ([place])   [place], Jolene T, NP   7 months ago Uncontrolled type 2 diabetes mellitus with hyperglycemia ([place])   [place], Barbaraann Faster, NP   1 year ago Encounter for annual physical exam   [place], Henrine Screws T, NP   1 year ago Uncontrolled type 2 diabetes mellitus with hyperglycemia ([place])   [place], Barbaraann Faster, NP      Future Appointments            In 3 weeks  [place], [place]   In 1 month Cannady, Barbaraann Faster, NP [facility]

## 2021-10-21 ENCOUNTER — Telehealth: Payer: Self-pay | Admitting: Nurse Practitioner

## 2021-10-21 ENCOUNTER — Telehealth: Payer: Self-pay

## 2021-10-21 NOTE — Telephone Encounter (Signed)
Pt dropped off patient assistance form to be completed by the provider and Madelin Rear.  Please call patient with any concerns and when forms are completed.  Placed in provider's folder.

## 2021-10-21 NOTE — Progress Notes (Signed)
I have called the patient and let her know we will be taking care of the prescriber portion of the application and explained that the patient will need to drop off her patient section of the form at the office so once we complete it we can fax it together to [facility] cares. Patient verbally understood. I also sent a copy of the PCP section to the CPP so that the provider can sign the form.

## 2021-10-29 ENCOUNTER — Other Ambulatory Visit: Payer: Self-pay | Admitting: Nurse Practitioner

## 2021-10-29 NOTE — Telephone Encounter (Signed)
Requested Prescriptions  Pending Prescriptions Disp Refills  . fluticasone (FLONASE) 50 MCG/ACT nasal spray [Pharmacy Med Name: FLUTICASONE PROP 50 MCG SPRAY] 48 mL 0    Sig: SPRAY 2 SPRAYS INTO EACH NOSTRIL EVERY DAY     Ear, Nose, and Throat: Nasal Preparations - Corticosteroids Passed - 10/29/2021  1:24 AM      Passed - Valid encounter within last 12 months    Recent Outpatient Visits          1 month ago Uncontrolled type 2 diabetes mellitus with hyperglycemia ([name])   [place], Jolene T, NP   5 months ago Uncontrolled type 2 diabetes mellitus with hyperglycemia ([name])   [place], Jolene T, NP   8 months ago Uncontrolled type 2 diabetes mellitus with hyperglycemia ([name])   [place], Barbaraann Faster, NP   1 year ago Encounter for annual physical exam   [place], Henrine Screws T, NP   1 year ago Uncontrolled type 2 diabetes mellitus with hyperglycemia ([name])   [place], Barbaraann Faster, NP      Future Appointments            In 1 week  [place], [place]   In 1 month Cannady, Barbaraann Faster, NP [facility], [facility]

## 2021-11-11 ENCOUNTER — Ambulatory Visit: Payer: Medicare Other

## 2021-11-20 ENCOUNTER — Encounter: Payer: Self-pay | Admitting: Nurse Practitioner

## 2021-11-21 NOTE — Telephone Encounter (Signed)
Hello Christine Booth has returned to the office, I will coordinate with him on how we can get this taken care of for the patient.

## 2021-11-26 ENCOUNTER — Ambulatory Visit (INDEPENDENT_AMBULATORY_CARE_PROVIDER_SITE_OTHER): Payer: Medicare Other | Admitting: *Deleted

## 2021-11-26 DIAGNOSIS — Z0000 Encounter for general adult medical examination without abnormal findings: Secondary | ICD-10-CM | POA: Diagnosis not present

## 2021-11-26 NOTE — Patient Instructions (Signed)
Ms. Shidler , Thank you for taking time to come for your Medicare Wellness Visit. I appreciate your ongoing commitment to your health goals. Please review the following plan we discussed and let me know if I can assist you in the future.   Screening recommendations/referrals: Colonoscopy: Education provided Mammogram: up to date Bone Density: up to date Recommended yearly ophthalmology/optometry visit for glaucoma screening and checkup Recommended yearly dental visit for hygiene and checkup  Vaccinations: Influenza vaccine: up to date Pneumococcal vaccine: up to date Tdap vaccine: up to date Shingles vaccine: up to date    Advanced directives: Education provided  Conditions/risks identified:   Next appointment: 12-30-222 @ 8:00  [place] 67 Years and Older, Female Preventive care refers to lifestyle choices and visits with your health care provider that can promote health and wellness. What does preventive care include? A yearly physical exam. This is also called an annual well check. Dental exams once or twice a year. Routine eye exams. Ask your health care provider how often you should have your eyes checked. Personal lifestyle choices, including: Daily care of your teeth and gums. Regular physical activity. Eating a healthy diet. Avoiding tobacco and drug use. Limiting alcohol use. Practicing safe sex. Taking low-dose aspirin every day. Taking vitamin and mineral supplements as recommended by your health care provider. What happens during an annual well check? The services and screenings done by your health care provider during your annual well check will depend on your age, overall health, lifestyle risk factors, and family history of disease. Counseling  Your health care provider may ask you questions about your: Alcohol use. Tobacco use. Drug use. Emotional well-being. Home and relationship well-being. Sexual activity. Eating habits. History of  falls. Memory and ability to understand (cognition). Work and work Statistician. Reproductive health. Screening  You may have the following tests or measurements: Height, weight, and BMI. Blood pressure. Lipid and cholesterol levels. These may be checked every 5 years, or more frequently if you are over 36 years old. Skin check. Lung cancer screening. You may have this screening every year starting at age 6 if you have a 30-pack-year history of smoking and currently smoke or have quit within the past 15 years. Fecal occult blood test (FOBT) of the stool. You may have this test every year starting at age 43. Flexible sigmoidoscopy or colonoscopy. You may have a sigmoidoscopy every 5 years or a colonoscopy every 10 years starting at age 25. Hepatitis C blood test. Hepatitis B blood test. Sexually transmitted disease (STD) testing. Diabetes screening. This is done by checking your blood sugar (glucose) after you have not eaten for a while (fasting). You may have this done every 1-3 years. Bone density scan. This is done to screen for osteoporosis. You may have this done starting at age 22. Mammogram. This may be done every 1-2 years. Talk to your health care provider about how often you should have regular mammograms. Talk with your health care provider about your test results, treatment options, and if necessary, the need for more tests. Vaccines  Your health care provider may recommend certain vaccines, such as: Influenza vaccine. This is recommended every year. Tetanus, diphtheria, and acellular pertussis (Tdap, Td) vaccine. You may need a Td booster every 10 years. Zoster vaccine. You may need this after age 19. Pneumococcal 13-valent conjugate (PCV13) vaccine. One dose is recommended after age 47. Pneumococcal polysaccharide (PPSV23) vaccine. One dose is recommended after age 36. Talk to your health care  provider about which screenings and vaccines you need and how often you need  them. This information is not intended to replace advice given to you by your health care provider. Make sure you discuss any questions you have with your health care provider. Document Released: 12/21/2015 Document Revised: 08/13/2016 Document Reviewed: 09/25/2015 Elsevier Interactive Patient Education  2017 [place] Prevention in the Home Falls can cause injuries. They can happen to people of all ages. There are many things you can do to make your home safe and to help prevent falls. What can I do on the outside of my home? Regularly fix the edges of walkways and driveways and fix any cracks. Remove anything that might make you trip as you walk through a door, such as a raised step or threshold. Trim any bushes or trees on the path to your home. Use bright outdoor lighting. Clear any walking paths of anything that might make someone trip, such as rocks or tools. Regularly check to see if handrails are loose or broken. Make sure that both sides of any steps have handrails. Any raised decks and porches should have guardrails on the edges. Have any leaves, snow, or ice cleared regularly. Use sand or salt on walking paths during winter. Clean up any spills in your garage right away. This includes oil or grease spills. What can I do in the bathroom? Use night lights. Install grab bars by the toilet and in the tub and shower. Do not use towel bars as grab bars. Use non-skid mats or decals in the tub or shower. If you need to sit down in the shower, use a plastic, non-slip stool. Keep the floor dry. Clean up any water that spills on the floor as soon as it happens. Remove soap buildup in the tub or shower regularly. Attach bath mats securely with double-sided non-slip rug tape. Do not have throw rugs and other things on the floor that can make you trip. What can I do in the bedroom? Use night lights. Make sure that you have a light by your bed that is easy to reach. Do not use  any sheets or blankets that are too big for your bed. They should not hang down onto the floor. Have a firm chair that has side arms. You can use this for support while you get dressed. Do not have throw rugs and other things on the floor that can make you trip. What can I do in the kitchen? Clean up any spills right away. Avoid walking on wet floors. Keep items that you use a lot in easy-to-reach places. If you need to reach something above you, use a strong step stool that has a grab bar. Keep electrical cords out of the way. Do not use floor polish or wax that makes floors slippery. If you must use wax, use non-skid floor wax. Do not have throw rugs and other things on the floor that can make you trip. What can I do with my stairs? Do not leave any items on the stairs. Make sure that there are handrails on both sides of the stairs and use them. Fix handrails that are broken or loose. Make sure that handrails are as long as the stairways. Check any carpeting to make sure that it is firmly attached to the stairs. Fix any carpet that is loose or worn. Avoid having throw rugs at the top or bottom of the stairs. If you do have throw rugs, attach them to the  floor with carpet tape. Make sure that you have a light switch at the top of the stairs and the bottom of the stairs. If you do not have them, ask someone to add them for you. What else can I do to help prevent falls? Wear shoes that: Do not have high heels. Have rubber bottoms. Are comfortable and fit you well. Are closed at the toe. Do not wear sandals. If you use a stepladder: Make sure that it is fully opened. Do not climb a closed stepladder. Make sure that both sides of the stepladder are locked into place. Ask someone to hold it for you, if possible. Clearly mark and make sure that you can see: Any grab bars or handrails. First and last steps. Where the edge of each step is. Use tools that help you move around (mobility aids)  if they are needed. These include: Canes. Walkers. Scooters. Crutches. Turn on the lights when you go into a dark area. Replace any light bulbs as soon as they burn out. Set up your furniture so you have a clear path. Avoid moving your furniture around. If any of your floors are uneven, fix them. If there are any pets around you, be aware of where they are. Review your medicines with your doctor. Some medicines can make you feel dizzy. This can increase your chance of falling. Ask your doctor what other things that you can do to help prevent falls. This information is not intended to replace advice given to you by your health care provider. Make sure you discuss any questions you have with your health care provider. Document Released: 09/20/2009 Document Revised: 05/01/2016 Document Reviewed: 12/29/2014 Elsevier Interactive Patient Education  2017 [employer].

## 2021-11-26 NOTE — Progress Notes (Signed)
Subjective:   Christine Booth is a 65 y.o. female who presents for Medicare Annual (Subsequent) preventive examination.  I connected with  Christine Booth on 11/26/21 by a telephone enabled telemedicine application and verified that I am speaking with the correct person using two identifiers.   I discussed the limitations of evaluation and management by telemedicine. The patient expressed understanding and agreed to proceed.  Patient location: home  Provider location: Tele-Health  not in office        Review of Systems     Cardiac Risk Factors include: advanced age (>48men, >34 women);diabetes mellitus;hypertension     Objective:    Today's Vitals   There is no height or weight on file to calculate BMI.  Advanced Directives 11/26/2021 11/09/2020 02/06/2017  Does Patient Have a Medical Advance Directive? No No No  Would patient like information on creating a medical advance directive? No - Patient declined - -    Current Medications (verified) Outpatient Encounter Medications as of 11/26/2021  Medication Sig   amLODipine (NORVASC) 2.5 MG tablet Take 1 tablet (2.5 mg total) by mouth daily.   calcium carbonate (OSCAL) 1500 (600 Ca) MG TABS tablet Take 600 mg of elemental calcium by mouth daily.   empagliflozin (JARDIANCE) 25 MG TABS tablet Take 25 mg by mouth daily.   fluticasone (FLONASE) 50 MCG/ACT nasal spray SPRAY 2 SPRAYS INTO EACH NOSTRIL EVERY DAY   glucose blood test strip Use as instructed   meloxicam (MOBIC) 15 MG tablet TAKE 1 TABLET BY MOUTH EVERY DAY   methocarbamol (ROBAXIN) 500 MG tablet methocarbamol 500 mg tablet  TAKE 1 TABLET 3 TIMES A DAY BY ORAL ROUTE AS NEEDED.   olmesartan (BENICAR) 40 MG tablet Take 1 tablet (40 mg total) by mouth daily.   pantoprazole (PROTONIX) 40 MG tablet TAKE 1 TABLET BY MOUTH EVERY DAY   rosuvastatin (CRESTOR) 40 MG tablet TAKE 1 TABLET BY MOUTH EVERY DAY   Semaglutide, 2 MG/DOSE, 8 MG/3ML SOPN Inject 2 mg into the  skin once a week.   sennosides-docusate sodium (SENOKOT-S) 8.6-50 MG tablet Take 1 tablet by mouth daily as needed for constipation.   triamcinolone (KENALOG) 0.025 % cream APPLY TO AFFECTED AREA TWICE A DAY (Patient not taking: Reported on 11/26/2021)   [START ON 11/28/2021] Zoster Vaccine Adjuvanted [facility]) injection Inject 0.5 mLs into the muscle once for 1 dose. Dose # 2   No facility-administered encounter medications on file as of 11/26/2021.    Allergies (verified) Patient has no known allergies.   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   Diabetes mellitus without complication (HCC)    Gastrointestinal disorder    GERD (gastroesophageal reflux disease)    Heart murmur    Hyperlipidemia    Hypertension    Rosacea    Past Surgical History:  Procedure Laterality Date   COLONOSCOPY WITH PROPOFOL N/A 02/06/2017   Procedure: COLONOSCOPY WITH PROPOFOL;  Surgeon: Jonathon Bellows, MD;  Location: [facility] ENDOSCOPY;  Service: Endoscopy;  Laterality: N/A;   TUBAL LIGATION  1985   Family History  Problem Relation Age of Onset   Arthritis Mother    Cancer Mother        breast   Heart disease Mother    Cancer Father        bladder   Heart disease Father    Hypertension Father  Aneurysm Sister    Cancer Sister        breast   Social History   Socioeconomic History   Marital status: Divorced    Spouse name: Not on file   Number of children: Not on file   Years of education: Not on file   Highest education level: Not on file  Occupational History   Not on file  Tobacco Use   Smoking status: Never   Smokeless tobacco: Never  Vaping Use   Vaping Use: Never used  Substance and Sexual Activity   Alcohol use: No    Alcohol/week: 0.0 standard drinks   Drug use: No   Sexual activity: Not Currently  Other Topics Concern   Not on file  Social History Narrative    Not on file   Social Determinants of Health   Financial Resource Strain: Low Risk    Difficulty of Paying Living Expenses: Not hard at all  Food Insecurity: No Food Insecurity   Worried About Charity fundraiser in the Last Year: Never true   [place] in the Last Year: Never true  Transportation Needs: No Transportation Needs   Lack of Transportation (Medical): No   Lack of Transportation (Non-Medical): No  Physical Activity: Insufficiently Active   Days of Exercise per Week: 2 days   Minutes of Exercise per Session: 30 min  Stress: No Stress Concern Present   Feeling of Stress : Not at all  Social Connections: Socially Isolated   Frequency of Communication with Friends and Family: More than three times a week   Frequency of Social Gatherings with Friends and Family: More than three times a week   Attends Religious Services: Never   Marine scientist or Organizations: No   Attends Music therapist: Never   Marital Status: Divorced    Tobacco Counseling Counseling given: Not Answered   Clinical Intake:  Pre-visit preparation completed: Yes  Pain : No/denies pain     Nutritional Risks: None Diabetes: No  How often do you need to have someone help you when you read instructions, pamphlets, or other written materials from your doctor or pharmacy?: 1 - Never  Diabetic?  Yes  Nutrition Risk Assessment:  Has the patient had any N/V/D within the last 2 months?  No  Does the patient have any non-healing wounds?  No  Has the patient had any unintentional weight loss or weight gain?  No   Diabetes:  Is the patient diabetic?  Yes  If diabetic, was a CBG obtained today?  No  Did the patient bring in their glucometer from home?  No  How often do you monitor your CBG's? 1 x a week.   Financial Strains and Diabetes Management:  Are you having any financial strains with the device, your supplies or your medication? No .  Does the patient want to be  seen by Chronic Care Management for management of their diabetes?  No  Would the patient like to be referred to a Nutritionist or for Diabetic Management?  No   Diabetic Exams:  Diabetic Eye Exam: Completed . Overdue for diabetic eye exam. Pt has been advised about the importance in completing this exam.  Diabetic Foot Exam: Completed . Pt has been advised about the importance in completing this exam.   Interpreter Needed?: No  Information entered by :: Leroy Kennedy LPN   Activities of Daily Living In your present state of health, do you have any difficulty  performing the following activities: 11/26/2021  Hearing? N  Vision? N  Difficulty concentrating or making decisions? N  Walking or climbing stairs? N  Dressing or bathing? N  Doing errands, shopping? N  Preparing Food and eating ? N  Using the Toilet? N  In the past six months, have you accidently leaked urine? N  Do you have problems with loss of bowel control? N  Managing your Medications? N  Managing your Finances? N  Housekeeping or managing your Housekeeping? N  Some recent data might be hidden    Patient Care Team: [name], NP as PCP - General (Nurse Practitioner)  Indicate any recent Medical Services you may have received from other than [facility] providers in the past year (date may be approximate).     Assessment:   This is a routine wellness examination for Shainna.  Hearing/Vision screen Hearing Screening - Comments:: No trouble hearing Vision Screening - Comments:: Up to date Dr. Wyatt Portela  Dietary issues and exercise activities discussed: Current Exercise Habits: Home exercise routine, Type of exercise: walking, Time (Minutes): 30, Frequency (Times/Week): 2, Weekly Exercise (Minutes/Week): 60, Intensity: Mild   Goals Addressed             This Visit's Progress    Patient Stated       More Exercise        Depression Screen PHQ 2/9 Scores 11/26/2021 09/06/2021 11/09/2020 05/29/2020 05/04/2019  01/22/2018 01/12/2017  PHQ - 2 Score 0 2 0 0 0 0 0  PHQ- 9 Score - 4 - - 0 0 0    Fall Risk Fall Risk  11/26/2021 11/09/2020 05/29/2020 05/04/2019 02/03/2019  Falls in the past year? 1 0 0 0 1  Number falls in past yr: 0 - 0 - 0  Injury with Fall? 0 - 0 - 0  Risk for fall due to : - Medication side effect - - History of fall(s)  Follow up Falls evaluation completed;Falls prevention discussed Falls evaluation completed;Education provided;Falls prevention discussed Falls evaluation completed Falls evaluation completed Falls evaluation completed    FALL RISK PREVENTION PERTAINING TO THE HOME:  Any stairs in or around the home? No  If so, are there any without handrails? No  Home free of loose throw rugs in walkways, pet beds, electrical cords, etc? Yes  Adequate lighting in your home to reduce risk of falls? Yes   ASSISTIVE DEVICES UTILIZED TO PREVENT FALLS:  Life alert? No  Use of a cane, walker or w/c? No  Grab bars in the bathroom? No  Shower chair or bench in shower? No  Elevated toilet seat or a handicapped toilet? Yes   TIMED UP AND GO:  Was the test performed? No .   Cognitive Function:  Normal cognitive status assessed by direct observation by this Nurse Health Advisor. No abnormalities found.       6CIT Screen 11/09/2020  What Year? 0 points  What month? 0 points  What time? 0 points  Count back from 20 0 points  Months in reverse 0 points  Repeat phrase 2 points  Total Score 2    Immunizations Immunization History  Administered Date(s) Administered   Fluad Quad(high Dose 65+) 11/12/2020, 09/06/2021   Influenza,inj,Quad PF,6+ Mos 09/04/2015, 10/03/2016, 01/22/2018, 08/31/2018, 09/09/2019   Influenza-Unspecified 09/01/2014   PFIZER(Purple Top)SARS-COV-2 Vaccination 01/31/2020, 02/21/2020, 09/10/2020, 05/10/2021   Pneumococcal Conjugate-13 05/29/2020   Pneumococcal Polysaccharide-23 05/12/2011   Pneumococcal-Unspecified 05/12/2011   Td 10/10/2008, 01/22/2018    Zoster, Live 12/11/2015  TDAP status: Up to date  Flu Vaccine status: Up to date  Pneumococcal vaccine status: Up to date  Covid-19 vaccine status: Completed vaccines  Qualifies for Shingles Vaccine? No   Zostavax completed Yes   Shingrix Completed?: Yes  Screening Tests Health Maintenance  Topic Date Due   DEXA SCAN  Never done   Pneumonia Vaccine 106+ Years old (3 - PPSV23 if available, else PCV20) 05/29/2021   COVID-19 Vaccine (5 - Booster for Pfizer series) 07/05/2021   Zoster Vaccines- Shingrix (2 of 2) 11/01/2021   COLONOSCOPY (Pts 45-85yrs Insurance coverage will need to be confirmed)  05/31/2022 (Originally 02/06/2018)   FOOT EXAM  03/01/2022   HEMOGLOBIN A1C  03/06/2022   OPHTHALMOLOGY EXAM  03/29/2022   MAMMOGRAM  08/24/2022   TETANUS/TDAP  01/23/2028   INFLUENZA VACCINE  Completed   Hepatitis C Screening  Completed   HPV VACCINES  Aged Out    Health Maintenance  Health Maintenance Due  Topic Date Due   DEXA SCAN  Never done   Pneumonia Vaccine 87+ Years old (3 - PPSV23 if available, else PCV20) 05/29/2021   COVID-19 Vaccine (5 - Booster for Pfizer series) 07/05/2021   Zoster Vaccines- Shingrix (2 of 2) 11/01/2021  Zoster per patient will bring documentation to upcoming appointment  Colorectal cancer screening: Type of screening: Colonoscopy. Completed  . Repeat every discussed with PCP years  Mammogram status: Completed 2022. Repeat every year  Bone Density status: Completed 2022. Results reflect: Bone density results: NORMAL. Repeat every 0 years.  Lung Cancer Screening: (Low Dose CT Chest recommended if Age 71-80 years, 30 pack-year currently smoking OR have quit w/in 15years.) does not qualify.   Lung Cancer Screening Referral:   Additional Screening:  Hepatitis C Screening: does not qualify; Completed 2017  Vision Screening: Recommended annual ophthalmology exams for early detection of glaucoma and other disorders of the eye. Is the patient up  to date with their annual eye exam?  Yes  Who is the provider or what is the name of the office in which the patient attends annual eye exams? Dr. Wyatt Portela If pt is not established with a provider, would they like to be referred to a provider to establish care? No .   Dental Screening: Recommended annual dental exams for proper oral hygiene  Community Resource Referral / Chronic Care Management: CRR required this visit?  No   CCM required this visit?  No      Plan:     I have personally reviewed and noted the following in the patients chart:   Medical and social history Use of alcohol, tobacco or illicit drugs  Current medications and supplements including opioid prescriptions.  Functional ability and status Nutritional status Physical activity Advanced directives List of other physicians Hospitalizations, surgeries, and ER visits in previous 12 months Vitals Screenings to include cognitive, depression, and falls Referrals and appointments  In addition, I have reviewed and discussed with patient certain preventive protocols, quality metrics, and best practice recommendations. A written personalized care plan for preventive services as well as general preventive health recommendations were provided to patient.     Leroy Kennedy, LPN   [id number]/83/[id number]   Nurse Notes:

## 2021-12-06 ENCOUNTER — Encounter: Payer: Self-pay | Admitting: Nurse Practitioner

## 2021-12-06 ENCOUNTER — Other Ambulatory Visit: Payer: Self-pay

## 2021-12-06 ENCOUNTER — Ambulatory Visit (INDEPENDENT_AMBULATORY_CARE_PROVIDER_SITE_OTHER): Payer: Medicare Other | Admitting: Nurse Practitioner

## 2021-12-06 VITALS — BP 121/75 | HR 80 | Temp 98.1°F | Ht 61.0 in | Wt 158.4 lb

## 2021-12-06 DIAGNOSIS — Z0000 Encounter for general adult medical examination without abnormal findings: Secondary | ICD-10-CM | POA: Diagnosis not present

## 2021-12-06 DIAGNOSIS — E1159 Type 2 diabetes mellitus with other circulatory complications: Secondary | ICD-10-CM | POA: Diagnosis not present

## 2021-12-06 DIAGNOSIS — K219 Gastro-esophageal reflux disease without esophagitis: Secondary | ICD-10-CM

## 2021-12-06 DIAGNOSIS — I152 Hypertension secondary to endocrine disorders: Secondary | ICD-10-CM | POA: Diagnosis not present

## 2021-12-06 DIAGNOSIS — E785 Hyperlipidemia, unspecified: Secondary | ICD-10-CM | POA: Diagnosis not present

## 2021-12-06 DIAGNOSIS — D519 Vitamin B12 deficiency anemia, unspecified: Secondary | ICD-10-CM | POA: Diagnosis not present

## 2021-12-06 DIAGNOSIS — Z23 Encounter for immunization: Secondary | ICD-10-CM | POA: Diagnosis not present

## 2021-12-06 DIAGNOSIS — Z6830 Body mass index (BMI) 30.0-30.9, adult: Secondary | ICD-10-CM

## 2021-12-06 DIAGNOSIS — Z9189 Other specified personal risk factors, not elsewhere classified: Secondary | ICD-10-CM

## 2021-12-06 DIAGNOSIS — E876 Hypokalemia: Secondary | ICD-10-CM

## 2021-12-06 DIAGNOSIS — E1165 Type 2 diabetes mellitus with hyperglycemia: Secondary | ICD-10-CM | POA: Diagnosis not present

## 2021-12-06 DIAGNOSIS — E538 Deficiency of other specified B group vitamins: Secondary | ICD-10-CM

## 2021-12-06 DIAGNOSIS — E1169 Type 2 diabetes mellitus with other specified complication: Secondary | ICD-10-CM | POA: Diagnosis not present

## 2021-12-06 DIAGNOSIS — E6609 Other obesity due to excess calories: Secondary | ICD-10-CM

## 2021-12-06 LAB — MICROALBUMIN, URINE WAIVED
Creatinine, Urine Waived: 200 mg/dL (ref 10–300)
Microalb, Ur Waived: 10 mg/L (ref 0–19)
Microalb/Creat Ratio: <30 mg/g (ref <30)

## 2021-12-06 LAB — BAYER DCA HB A1C WAIVED: HB A1C (BAYER DCA - WAIVED): 7 % — ABNORMAL HIGH (ref 4.8–5.6)

## 2021-12-06 NOTE — Assessment & Plan Note (Addendum)
Chronic, ongoing.  Continue current medication regimen and adjust as needed. Lipid panel today. 

## 2021-12-06 NOTE — Assessment & Plan Note (Signed)
BMI 29.23.  Recommended eating smaller high protein, low fat meals more frequently and exercising 30 mins a day 5 times a week with a goal of 10-15lb weight loss in the next 3 months. Patient voiced their understanding and motivation to adhere to these recommendations.

## 2021-12-06 NOTE — Assessment & Plan Note (Signed)
Chronic, stable at this time.  Obtain mag level yearly, up to date.  Continue current medication regimen and collaboration with GI team.

## 2021-12-06 NOTE — Assessment & Plan Note (Signed)
Chronic, ongoing with A1C last visit 7.5% (downward trend) and urine ALB 30 in March 2022 -- recheck both today, continue ARB for kidney protection.  At this time continue Ozempic and Jardiance, she is at max dose of each.   If ongoing elevation A1c could consider sulfonylurea at very low dose, discussed with patient, due to lower cost and wishes to avoid insulin.  Avoid Metformin as did not tolerate in past.  Recommend continue to check BS daily and return to regular exercise and diet focus to maintain A1C <7.  Return in 3 months.

## 2021-12-06 NOTE — Assessment & Plan Note (Signed)
Recheck level today, recent 3.3.

## 2021-12-06 NOTE — Assessment & Plan Note (Signed)
Chronic, ongoing with BP improved today with addition of Amlodipine.  Continue Olmesartan and Amlodipine at current dosing and adjust as needed.  CMP and CBC today.  Continue to monitor BP at home regularly and focus on DASH diet + regular exercise.  Return in 3 months.

## 2021-12-06 NOTE — Progress Notes (Signed)
BP 121/75    Pulse 80    Temp 98.1 F (36.7 C)    Ht _0  (1.549 m)    Wt 158 lb 6.4 oz (71.8 kg)    LMP  (LMP Unknown)    SpO2 98%    BMI 29.93 kg/m    Subjective:    Patient ID: Christine Booth, female    DOB: 11/05/1956, 65 y.o.   MRN: [id number]  HPI: Christine Booth is a 65 y.o. female presenting on 12/06/2021 for comprehensive medical examination. Current medical complaints include:none  She currently lives with: self Menopausal Symptoms: no   DIABETES Taking Jardiance 25 MG daily and Ozempic 2 MG weekly. she is working with CCM team on cost of medications. Last visit A1c in September was 7.5%.  She does endorse enjoying sweets.   Did take Metformin initially, caused some severe constipation.    Last colonoscopy was in 2018, she was to repeat in one year but did not attend this.  Reports fears about this, which PCP discussed at length with her importance of repeat screening to ensure no cancerous polyps present.  She reports wishes for no treatment if something were present.  Educated her on colon CA.  Hypoglycemic episodes:no Polydipsia/polyuria: no Visual disturbance: no Chest pain: no Paresthesias: no Glucose Monitoring: yes             Accucheck frequency: once every couple weeks             Fasting glucose: 160-170 range at home             Post prandial:             Evening:             Before meals: Taking Insulin?: no             Long acting insulin:             Short acting insulin: Blood Pressure Monitoring: daily Retinal Examination: Up To Date  Foot Exam: Up to Date Pneumovax: Up to Date Influenza: Up to Date Aspirin: yes    HYPERTENSION / HYPERLIPIDEMIA Taking Olmesartan 40 MG daily and Rosuvastatin 40 MG daily.  Stopped HCTZ in past due to low K+, this showed improvement with discontinuation of HCTZ -- we added Amlodipine at 2.5 MG last visit to assist with BP control. Satisfied with current treatment? yes Duration of hypertension:  chronic BP monitoring frequency: once every two weeks BP range: 120/70 range BP medication side effects: no Duration of hyperlipidemia: chronic Cholesterol medication side effects: no Cholesterol supplements: none Medication compliance: good compliance Aspirin: yes Recent stressors: no Recurrent headaches: none Visual changes: no Palpitations: no Dyspnea: no Chest pain: no Lower extremity edema: no Dizzy/lightheaded: no    GERD Continues on daily Protonix daily.   GERD control status: stable  Satisfied with current treatment? yes Heartburn frequency: none Medication side effects: no  Medication compliance: stable Dysphagia: no Odynophagia:  no Hematemesis: no Blood in stool: no EGD: yes  Depression screen [facility] 2/9 12/06/2021 11/26/2021 09/06/2021 11/09/2020 05/29/2020  Decreased Interest 0 0 1 0 0  Down, Depressed, Hopeless 0 0 1 0 0  PHQ - 2 Score 0 0 2 0 0  Altered sleeping 1 - 0 - -  Tired, decreased energy 0 - 1 - -  Change in appetite 1 - 1 - -  Feeling bad or failure about yourself  0 - 0 - -  Trouble concentrating 0 -  0 - -  Moving slowly or fidgety/restless 0 - 0 - -  Suicidal thoughts 0 - 0 - -  PHQ-9 Score 2 - 4 - -  Difficult doing work/chores Not difficult at all - - - -    Fall Risk 05/04/2019 05/29/2020 11/09/2020 11/26/2021 12/06/2021  Falls in the past year? 0 0 0 1 0  Was there an injury with Fall? - 0 - 0 0  Fall Risk Category Calculator - 0 - 1 0  Fall Risk Category - Low - Low Low  Patient Fall Risk Level _0   Patient at Risk for Falls Due to - - Medication side effect - No Fall Risks  Fall risk Follow up Falls evaluation completed Falls evaluation completed Falls evaluation completed;Education provided;Falls prevention discussed Falls evaluation completed;Falls prevention discussed Falls evaluation completed    Functional Status Survey: Is the patient deaf or have difficulty  hearing?: No Does the patient have difficulty seeing, even when wearing glasses/contacts?: No Does the patient have difficulty concentrating, remembering, or making decisions?: No Does the patient have difficulty walking or climbing stairs?: No Does the patient have difficulty dressing or bathing?: No Does the patient have difficulty doing errands alone such as visiting a doctor's office or shopping?: No   Past Medical History:  Past Medical History:  Diagnosis Date   Allergy    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   Chronic kidney disease stage 1   Diabetes mellitus without complication (HCC)    Gastrointestinal disorder    GERD (gastroesophageal reflux disease)    Heart murmur    Hyperlipidemia    Hypertension    Rosacea     Surgical History:  Past Surgical History:  Procedure Laterality Date   COLONOSCOPY WITH PROPOFOL N/A 02/06/2017   Procedure: COLONOSCOPY WITH PROPOFOL;  Surgeon: Jonathon Bellows, MD;  Location: [facility] ENDOSCOPY;  Service: Endoscopy;  Laterality: N/A;   TUBAL LIGATION  1985    Medications:  Current Outpatient Medications on File Prior to Visit  Medication Sig   amLODipine (NORVASC) 2.5 MG tablet Take 1 tablet (2.5 mg total) by mouth daily.   calcium carbonate (OSCAL) 1500 (600 Ca) MG TABS tablet Take 600 mg of elemental calcium by mouth daily.   empagliflozin (JARDIANCE) 25 MG TABS tablet Take 25 mg by mouth daily.   fluticasone (FLONASE) 50 MCG/ACT nasal spray SPRAY 2 SPRAYS INTO EACH NOSTRIL EVERY DAY   glucose blood test strip Use as instructed   meloxicam (MOBIC) 15 MG tablet TAKE 1 TABLET BY MOUTH EVERY DAY   methocarbamol (ROBAXIN) 500 MG tablet methocarbamol 500 mg tablet  TAKE 1 TABLET 3 TIMES A DAY BY ORAL ROUTE AS NEEDED.   olmesartan (BENICAR) 40 MG tablet Take 1 tablet (40 mg total) by mouth daily.   pantoprazole (PROTONIX) 40 MG tablet TAKE 1 TABLET BY MOUTH EVERY DAY   rosuvastatin (CRESTOR) 40 MG tablet TAKE 1 TABLET BY MOUTH  EVERY DAY   Semaglutide, 2 MG/DOSE, 8 MG/3ML SOPN Inject 2 mg into the skin once a week.   sennosides-docusate sodium (SENOKOT-S) 8.6-50 MG tablet Take 1 tablet by mouth daily as needed for constipation.   triamcinolone (KENALOG) 0.025 % cream APPLY TO AFFECTED AREA TWICE A DAY   No current facility-administered medications on file prior to visit.    Allergies:  No Known Allergies  Social History:  Social History   Socioeconomic History   Marital  status: Divorced    Spouse name: Not on file   Number of children: Not on file   Years of education: Not on file   Highest education level: Not on file  Occupational History   Not on file  Tobacco Use   Smoking status: Never   Smokeless tobacco: Never  Vaping Use   Vaping Use: Never used  Substance and Sexual Activity   Alcohol use: No    Alcohol/week: 0.0 standard drinks   Drug use: No   Sexual activity: Not Currently  Other Topics Concern   Not on file  Social History Narrative   Not on file   Social Determinants of Health   Financial Resource Strain: Low Risk    Difficulty of Paying Living Expenses: Not hard at all  Food Insecurity: No Food Insecurity   Worried About Charity fundraiser in the Last Year: Never true   [place] in the Last Year: Never true  Transportation Needs: No Transportation Needs   Lack of Transportation (Medical): No   Lack of Transportation (Non-Medical): No  Physical Activity: Insufficiently Active   Days of Exercise per Week: 2 days   Minutes of Exercise per Session: 30 min  Stress: No Stress Concern Present   Feeling of Stress : Not at all  Social Connections: Socially Isolated   Frequency of Communication with Friends and Family: More than three times a week   Frequency of Social Gatherings with Friends and Family: More than three times a week   Attends Religious Services: Never   Marine scientist or Organizations: No   Attends Music therapist: Never   Marital  Status: Divorced  Human resources officer Violence: Not At Risk   Fear of Current or Ex-Partner: No   Emotionally Abused: No   Physically Abused: No   Sexually Abused: No   Social History   Tobacco Use  Smoking Status Never  Smokeless Tobacco Never   Social History   Substance and Sexual Activity  Alcohol Use No   Alcohol/week: 0.0 standard drinks    Family History:  Family History  Problem Relation Age of Onset   Arthritis Mother    Cancer Mother        breast   Heart disease Mother    Cancer Father        bladder   Heart disease Father    Hypertension Father    Aneurysm Sister    Cancer Sister        breast    Past medical history, surgical history, medications, allergies, family history and social history reviewed with patient today and changes made to appropriate areas of the chart.   ROS All other ROS negative except what is listed above and in the HPI.      Objective:    BP 121/75    Pulse 80    Temp 98.1 F (36.7 C)    Ht _0  (1.549 m)    Wt 158 lb 6.4 oz (71.8 kg)    LMP  (LMP Unknown)    SpO2 98%    BMI 29.93 kg/m   Wt Readings from Last 3 Encounters:  12/06/21 158 lb 6.4 oz (71.8 kg)  09/06/21 158 lb 12.8 oz (72 kg)  05/31/21 157 lb 6.4 oz (71.4 kg)    Physical Exam Vitals and nursing note reviewed. Exam conducted with a chaperone present.  Constitutional:      General: She is awake. She is not in  acute distress.    Appearance: She is well-developed and well-groomed. She is not ill-appearing or toxic-appearing.  HENT:     Head: Normocephalic and atraumatic.     Right Ear: Hearing, tympanic membrane, ear canal and external ear normal. No drainage.     Left Ear: Hearing, tympanic membrane, ear canal and external ear normal. No drainage.     Nose: Nose normal.     Right Sinus: No maxillary sinus tenderness or frontal sinus tenderness.     Left Sinus: No maxillary sinus tenderness or frontal sinus tenderness.     Mouth/Throat:     Mouth: Mucous  membranes are moist.     Pharynx: Oropharynx is clear. Uvula midline. No pharyngeal swelling, oropharyngeal exudate or posterior oropharyngeal erythema.  Eyes:     General: Lids are normal.        Right eye: No discharge.        Left eye: No discharge.     Extraocular Movements: Extraocular movements intact.     Conjunctiva/sclera: Conjunctivae normal.     Pupils: Pupils are equal, round, and reactive to light.     Visual Fields: Right eye visual fields normal and left eye visual fields normal.  Neck:     Thyroid: No thyromegaly.     Vascular: No carotid bruit.     Trachea: Trachea normal.  Cardiovascular:     Rate and Rhythm: Normal rate and regular rhythm.     Heart sounds: Normal heart sounds. No murmur heard.   No gallop.  Pulmonary:     Effort: Pulmonary effort is normal. No accessory muscle usage or respiratory distress.     Breath sounds: Normal breath sounds.  Abdominal:     General: Bowel sounds are normal.     Palpations: Abdomen is soft. There is no hepatomegaly or splenomegaly.     Tenderness: There is no abdominal tenderness.  Musculoskeletal:        General: Normal range of motion.     Cervical back: Normal range of motion and neck supple.     Right lower leg: No edema.     Left lower leg: No edema.  Lymphadenopathy:     Head:     Right side of head: No submental, submandibular, tonsillar, preauricular or posterior auricular adenopathy.     Left side of head: No submental, submandibular, tonsillar, preauricular or posterior auricular adenopathy.     Cervical: No cervical adenopathy.  Skin:    General: Skin is warm and dry.     Capillary Refill: Capillary refill takes less than 2 seconds.     Findings: No rash.  Neurological:     Mental Status: She is alert and oriented to person, place, and time.     Gait: Gait is intact.     Deep Tendon Reflexes: Reflexes are normal and symmetric.     Reflex Scores:      Brachioradialis reflexes are 2+ on the right side and  2+ on the left side.      Patellar reflexes are 2+ on the right side and 2+ on the left side. Psychiatric:        Attention and Perception: Attention normal.        Mood and Affect: Mood normal.        Speech: Speech normal.        Behavior: Behavior normal. Behavior is cooperative.        Thought Content: Thought content normal.        Judgment:  Judgment normal.    Results for orders placed or performed in visit on 09/06/21  Bayer DCA Hb A1c Waived  Result Value Ref Range   HB A1C (BAYER DCA - WAIVED) 7.5 (H) 4.8 - 5.6 %  Basic metabolic panel  Result Value Ref Range   Glucose 127 (H) 70 - 99 mg/dL   BUN 11 8 - 27 mg/dL   Creatinine, Ser 0.51 (L) 0.57 - 1.00 mg/dL   eGFR 103 >59 mL/min/1.73   BUN/Creatinine Ratio 22 12 - 28   Sodium 141 134 - 144 mmol/L   Potassium 3.3 (L) 3.5 - 5.2 mmol/L   Chloride 104 96 - 106 mmol/L   CO2 19 (L) 20 - 29 mmol/L   Calcium 9.2 8.7 - 10.3 mg/dL  Lipid Panel w/o Chol/HDL Ratio  Result Value Ref Range   Cholesterol, Total 108 100 - 199 mg/dL   Triglycerides 99 0 - 149 mg/dL   HDL 49 >39 mg/dL   VLDL Cholesterol Cal 19 5 - 40 mg/dL   LDL Chol Calc (NIH) 40 0 - 99 mg/dL  Vitamin B12  Result Value Ref Range   Vitamin B-12 419 232 - 1,245 pg/mL  TSH  Result Value Ref Range   TSH 1.210 0.450 - 4.500 uIU/mL  Magnesium  Result Value Ref Range   Magnesium 2.0 1.6 - 2.3 mg/dL      Assessment & Plan:   Problem List Items Addressed This Visit       Cardiovascular and Mediastinum   Hypertension associated with diabetes (HCC)    Chronic, ongoing with BP improved today with addition of Amlodipine.  Continue Olmesartan and Amlodipine at current dosing and adjust as needed.  CMP and CBC today.  Continue to monitor BP at home regularly and focus on DASH diet + regular exercise.  Return in 3 months.      Relevant Orders   CBC with Differential/Platelet   Microalbumin, Urine Waived   Bayer DCA Hb A1c Waived     Digestive   GERD  (gastroesophageal reflux disease)    Chronic, stable at this time.  Obtain mag level yearly, up to date.  Continue current medication regimen and collaboration with GI team.          Endocrine   Hyperlipidemia associated with type 2 diabetes mellitus (HCC)    Chronic, ongoing. Continue current medication regimen and adjust as needed.  Lipid panel today.      Relevant Orders   Comprehensive metabolic panel   Lipid Panel w/o Chol/HDL Ratio   Bayer DCA Hb A1c Waived   Type 2 diabetes mellitus with hyperglycemia (HCC) - Primary    Chronic, ongoing with A1C last visit 7.5% (downward trend) and urine ALB 30 in March 2022 -- recheck both today, continue ARB for kidney protection.  At this time continue Ozempic and Jardiance, she is at max dose of each.   If ongoing elevation A1c could consider sulfonylurea at very low dose, discussed with patient, due to lower cost and wishes to avoid insulin.  Avoid Metformin as did not tolerate in past.  Recommend continue to check BS daily and return to regular exercise and diet focus to maintain A1C <7.  Return in 3 months.      Relevant Orders   Comprehensive metabolic panel   Microalbumin, Urine Waived   Bayer DCA Hb A1c Waived     Other   Hypokalemia    Recheck level today, recent 3.3.      Relevant  Orders   Comprehensive metabolic panel   Obesity    BMI 29.23.  Recommended eating smaller high protein, low fat meals more frequently and exercising 30 mins a day 5 times a week with a goal of 10-15lb weight loss in the next 3 months. Patient voiced their understanding and motivation to adhere to these recommendations.       Other Visit Diagnoses     B12 deficiency       History of low levels, recheck today and initiate supplement as needed.   Relevant Orders   Vitamin B12   Pneumococcal vaccination indicated       PPSV23 due today.   Relevant Orders   Pneumococcal polysaccharide vaccine 23-valent greater than or equal to 2yo subcutaneous/IM    Encounter for annual physical exam       Annual physical with labs performed and health maintenance reviewed -- continues to refuse colonoscopy or cologuard.        Follow up plan: Return in about 3 months (around 03/06/2022) for T2DM, HTN/HLD, GERD.   LABORATORY TESTING:  - Pap smear: not applicable  IMMUNIZATIONS:   - Tdap: Tetanus vaccination status reviewed: last tetanus booster within 10 years. - Influenza: Up to date - Pneumovax: Administered today - Prevnar: Up to date - COVID: Up to date - HPV: Not applicable - Shingrix vaccine: Up to date  SCREENING: -Mammogram: Up to date  - Colonoscopy: Refused  - Bone Density: Up to date  -Hearing Test: Not applicable  -Spirometry: Not applicable   PATIENT COUNSELING:   Advised to take 1 mg of folate supplement per day if capable of pregnancy.   Sexuality: Discussed sexually transmitted diseases, partner selection, use of condoms, avoidance of unintended pregnancy  and contraceptive alternatives.   Advised to avoid cigarette smoking.  I discussed with the patient that most people either abstain from alcohol or drink within safe limits (<=14/week and <=4 drinks/occasion for males, <=7/weeks and <= 3 drinks/occasion for females) and that the risk for alcohol disorders and other health effects rises proportionally with the number of drinks per week and how often a drinker exceeds daily limits.  Discussed cessation/primary prevention of drug use and availability of treatment for abuse.   Diet: Encouraged to adjust caloric intake to maintain  or achieve ideal body weight, to reduce intake of dietary saturated fat and total fat, to limit sodium intake by avoiding high sodium foods and not adding table salt, and to maintain adequate dietary potassium and calcium preferably from fresh fruits, vegetables, and low-fat dairy products.    Stressed the importance of regular exercise  Injury prevention: Discussed safety belts, safety  helmets, smoke detector, smoking near bedding or upholstery.   Dental health: Discussed importance of regular tooth brushing, flossing, and dental visits.    NEXT PREVENTATIVE PHYSICAL DUE IN 1 YEAR. Return in about 3 months (around 03/06/2022) for T2DM, HTN/HLD, GERD.

## 2021-12-06 NOTE — Patient Instructions (Signed)

## 2021-12-07 LAB — LIPID PANEL W/O CHOL/HDL RATIO
Cholesterol, Total: 111 mg/dL (ref 100–199)
HDL: 57 mg/dL (ref >39)
LDL Chol Calc (NIH): 36 mg/dL (ref 0–99)
Triglycerides: 92 mg/dL (ref 0–149)
VLDL Cholesterol Cal: 18 mg/dL (ref 5–40)

## 2021-12-07 LAB — CBC WITH DIFFERENTIAL/PLATELET
Basophils Absolute: 0.1 10*3/uL (ref 0.0–0.2)
Basos: 1 %
EOS (ABSOLUTE): 0.2 10*3/uL (ref 0.0–0.4)
Eos: 3 %
Hematocrit: 44.1 % (ref 34.0–46.6)
Hemoglobin: 14.5 g/dL (ref 11.1–15.9)
Immature Grans (Abs): 0 10*3/uL (ref 0.0–0.1)
Immature Granulocytes: 0 %
Lymphocytes Absolute: 2.9 10*3/uL (ref 0.7–3.1)
Lymphs: 39 %
MCH: 31.2 pg (ref 26.6–33.0)
MCHC: 32.9 g/dL (ref 31.5–35.7)
MCV: 95 fL (ref 79–97)
Monocytes Absolute: 0.5 10*3/uL (ref 0.1–0.9)
Monocytes: 6 %
Neutrophils Absolute: 3.7 10*3/uL (ref 1.4–7.0)
Neutrophils: 51 %
Platelets: 227 10*3/uL (ref 150–450)
RBC: 4.65 x10E6/uL (ref 3.77–5.28)
RDW: 12.7 % (ref 11.7–15.4)
WBC: 7.4 10*3/uL (ref 3.4–10.8)

## 2021-12-07 LAB — COMPREHENSIVE METABOLIC PANEL
ALT: 21 IU/L (ref 0–32)
AST: 23 IU/L (ref 0–40)
Albumin/Globulin Ratio: 2.1 (ref 1.2–2.2)
Albumin: 4.5 g/dL (ref 3.8–4.8)
Alkaline Phosphatase: 65 IU/L (ref 44–121)
BUN/Creatinine Ratio: 13 (ref 12–28)
BUN: 8 mg/dL (ref 8–27)
Bilirubin Total: 0.5 mg/dL (ref 0.0–1.2)
CO2: 19 mmol/L — ABNORMAL LOW (ref 20–29)
Calcium: 9.2 mg/dL (ref 8.7–10.3)
Chloride: 109 mmol/L — ABNORMAL HIGH (ref 96–106)
Creatinine, Ser: 0.64 mg/dL (ref 0.57–1.00)
Globulin, Total: 2.1 g/dL (ref 1.5–4.5)
Glucose: 127 mg/dL — ABNORMAL HIGH (ref 70–99)
Potassium: 3.7 mmol/L (ref 3.5–5.2)
Sodium: 143 mmol/L (ref 134–144)
Total Protein: 6.6 g/dL (ref 6.0–8.5)
eGFR: 97 mL/min/{1.73_m2} (ref >59)

## 2021-12-07 LAB — VITAMIN B12: Vitamin B-12: 1415 pg/mL — ABNORMAL HIGH (ref 232–1245)

## 2021-12-07 NOTE — Progress Notes (Signed)
Contacted via [place] morning Christine Booth, your labs have returned: - CBC shows no anemia or infection. - Kidney function, creatinine and eGFR, is normal + liver function, AST and ALT, is normal. - Cholesterol levels look great, continue Rosuvastatin daily. - B12 level stable, you can start taking vitamin B12 tablet every other day at this time.  Any questions? Keep being awesome!!  Thank you for allowing me to participate in your care.  I appreciate you. Kindest regards, Yoshio Seliga

## 2021-12-20 ENCOUNTER — Encounter: Payer: Self-pay | Admitting: Nurse Practitioner

## 2021-12-23 ENCOUNTER — Telehealth (INDEPENDENT_AMBULATORY_CARE_PROVIDER_SITE_OTHER): Payer: Medicare Other | Admitting: Internal Medicine

## 2021-12-23 ENCOUNTER — Encounter: Payer: Self-pay | Admitting: Internal Medicine

## 2021-12-23 VITALS — BP 139/75 | HR 98 | Temp 98.7°F

## 2021-12-23 DIAGNOSIS — U071 COVID-19: Secondary | ICD-10-CM | POA: Diagnosis not present

## 2021-12-23 MED ORDER — FEXOFENADINE HCL 180 MG PO TABS: 180.0000 mg | ORAL_TABLET | Freq: Every day | ORAL | 1 refills | Status: DC

## 2021-12-23 MED ORDER — MOLNUPIRAVIR EUA 200MG CAPSULE: 4.0000 | ORAL_CAPSULE | Freq: Two times a day (BID) | ORAL | 0 refills | Status: AC

## 2021-12-23 NOTE — Progress Notes (Signed)
BP 139/75    Pulse 98    Temp 98.7 F (37.1 C) (Oral)    LMP  (LMP Unknown)    Subjective:    Patient ID: Christine Booth, female    DOB: 16-Jul-1956, 66 y.o.   MRN: [id number]  Chief Complaint  Patient presents with   Covid Positive    Tested positive on Friday, symptoms sore throat, runny nose, headache, eye burning, ear pain. Would like to start an antiviral.     HPI: Christine Booth is a 66 y.o. female    This visit was completed via video visit through MyChart due to the restrictions of the COVID-19 pandemic. All issues as above were discussed and addressed. Physical exam was done as above through visual confirmation on video through MyChart. If it was felt that the patient should be evaluated in the office, they were directed there. The patient verbally consented to this visit. Location of the patient: home Location of the provider: work Those involved with this call:  Provider: Charlynne Cousins, MD CMA: [name], [place] [employer]/Registration: [employer]  Time spent on call: 10 minutes with patient face to face via video conference. More than 50% of this time was spent in counseling and coordination of care. 10 minutes total spent in review of patient's record and preparation of their chart.  Tested positive for COVID + Friday  Has a terribel thorat pain feels better as day goes on , hard to swallow or eat anything, runny nose slight headache and ears and eyes hurt like a sinus pain No fever , was 99 t max. No yellow or green d/c from nasal passages. No muscle aches no fatigue  URI    Chief Complaint  Patient presents with   Covid Positive    Tested positive on Friday, symptoms sore throat, runny nose, headache, eye burning, ear pain. Would like to start an antiviral.     Relevant past medical, surgical, family and social history reviewed and updated as indicated. Interim medical history since our last visit reviewed. Allergies and medications reviewed and  updated.  Review of Systems  Per HPI unless specifically indicated above     Objective:    BP 139/75    Pulse 98    Temp 98.7 F (37.1 C) (Oral)    LMP  (LMP Unknown)   Wt Readings from Last 3 Encounters:  12/06/21 158 lb 6.4 oz (71.8 kg)  09/06/21 158 lb 12.8 oz (72 kg)  05/31/21 157 lb 6.4 oz (71.4 kg)    Physical Exam  Unable to peform sec to virtual visit.   Results for orders placed or performed in visit on 12/06/21  CBC with Differential/Platelet  Result Value Ref Range   WBC 7.4 3.4 - 10.8 x10E3/uL   RBC 4.65 3.77 - 5.28 x10E6/uL   Hemoglobin 14.5 11.1 - 15.9 g/dL   Hematocrit 44.1 34.0 - 46.6 %   MCV 95 79 - 97 fL   MCH 31.2 26.6 - 33.0 pg   MCHC 32.9 31.5 - 35.7 g/dL   RDW 12.7 11.7 - 15.4 %   Platelets 227 150 - 450 x10E3/uL   Neutrophils 51 Not Estab. %   Lymphs 39 Not Estab. %   Monocytes 6 Not Estab. %   Eos 3 Not Estab. %   Basos 1 Not Estab. %   Neutrophils Absolute 3.7 1.4 - 7.0 x10E3/uL   Lymphocytes Absolute 2.9 0.7 - 3.1 x10E3/uL   Monocytes Absolute 0.5 0.1 -  0.9 x10E3/uL   EOS (ABSOLUTE) 0.2 0.0 - 0.4 x10E3/uL   Basophils Absolute 0.1 0.0 - 0.2 x10E3/uL   Immature Granulocytes 0 Not Estab. %   Immature Grans (Abs) 0.0 0.0 - 0.1 x10E3/uL  Comprehensive metabolic panel  Result Value Ref Range   Glucose 127 (H) 70 - 99 mg/dL   BUN 8 8 - 27 mg/dL   Creatinine, Ser 0.64 0.57 - 1.00 mg/dL   eGFR 97 >59 mL/min/1.73   BUN/Creatinine Ratio 13 12 - 28   Sodium 143 134 - 144 mmol/L   Potassium 3.7 3.5 - 5.2 mmol/L   Chloride 109 (H) 96 - 106 mmol/L   CO2 19 (L) 20 - 29 mmol/L   Calcium 9.2 8.7 - 10.3 mg/dL   Total Protein 6.6 6.0 - 8.5 g/dL   Albumin 4.5 3.8 - 4.8 g/dL   Globulin, Total 2.1 1.5 - 4.5 g/dL   Albumin/Globulin Ratio 2.1 1.2 - 2.2   Bilirubin Total 0.5 0.0 - 1.2 mg/dL   Alkaline Phosphatase 65 44 - 121 IU/L   AST 23 0 - 40 IU/L   ALT 21 0 - 32 IU/L  Lipid Panel w/o Chol/HDL Ratio  Result Value Ref Range   Cholesterol, Total 111  100 - 199 mg/dL   Triglycerides 92 0 - 149 mg/dL   HDL 57 >39 mg/dL   VLDL Cholesterol Cal 18 5 - 40 mg/dL   LDL Chol Calc (NIH) 36 0 - 99 mg/dL  Vitamin B12  Result Value Ref Range   Vitamin B-12 1,415 (H) 232 - 1,245 pg/mL  Microalbumin, Urine Waived  Result Value Ref Range   Microalb, Ur Waived 10 0 - 19 mg/L   Creatinine, Urine Waived 200 10 - 300 mg/dL   Microalb/Creat Ratio <30 <30 mg/g  Bayer DCA Hb A1c Waived  Result Value Ref Range   HB A1C (BAYER DCA - WAIVED) 7.0 (H) 4.8 - 5.6 %        Current Outpatient Medications:    amLODipine (NORVASC) 2.5 MG tablet, Take 1 tablet (2.5 mg total) by mouth daily., Disp: 90 tablet, Rfl: 4   calcium carbonate (OSCAL) 1500 (600 Ca) MG TABS tablet, Take 600 mg of elemental calcium by mouth daily., Disp: , Rfl:    fexofenadine (ALLEGRA ALLERGY) 180 MG tablet, Take 1 tablet (180 mg total) by mouth daily., Disp: 10 tablet, Rfl: 1   fluticasone (FLONASE) 50 MCG/ACT nasal spray, SPRAY 2 SPRAYS INTO EACH NOSTRIL EVERY DAY, Disp: 48 mL, Rfl: 0   glucose blood test strip, Use as instructed, Disp: 100 each, Rfl: 12   meloxicam (MOBIC) 15 MG tablet, TAKE 1 TABLET BY MOUTH EVERY DAY, Disp: 90 tablet, Rfl: 2   methocarbamol (ROBAXIN) 500 MG tablet, methocarbamol 500 mg tablet  TAKE 1 TABLET 3 TIMES A DAY BY ORAL ROUTE AS NEEDED., Disp: , Rfl:    molnupiravir EUA (LAGEVRIO) 200 mg CAPS capsule, Take 4 capsules (800 mg total) by mouth 2 (two) times daily for 5 days., Disp: 40 capsule, Rfl: 0   olmesartan (BENICAR) 40 MG tablet, Take 1 tablet (40 mg total) by mouth daily., Disp: 90 tablet, Rfl: 4   pantoprazole (PROTONIX) 40 MG tablet, TAKE 1 TABLET BY MOUTH EVERY DAY, Disp: 90 tablet, Rfl: 0   rosuvastatin (CRESTOR) 40 MG tablet, TAKE 1 TABLET BY MOUTH EVERY DAY, Disp: 90 tablet, Rfl: 3   Semaglutide, 2 MG/DOSE, 8 MG/3ML SOPN, Inject 2 mg into the skin once a week., Disp: , Rfl:  sennosides-docusate sodium (SENOKOT-S) 8.6-50 MG tablet, Take 1 tablet by  mouth daily as needed for constipation., Disp: , Rfl:    triamcinolone (KENALOG) 0.025 % cream, APPLY TO AFFECTED AREA TWICE A DAY, Disp: 30 g, Rfl: 1   empagliflozin (JARDIANCE) 25 MG TABS tablet, Take 25 mg by mouth daily. (Patient not taking: Reported on 12/23/2021), Disp: 90 tablet, Rfl: 3    Assessment & Plan:  COVID : positive : will start pt on molnupiravir 800 mg bid x 5 days Increase fluid intake.  Headahce - tyelnol every 4-6 hrs prn and alternate this with ibubrufen 800 mg q 8 hrly. Sinus pressure: use steam inhalation.  OTC -  Allegra / claritin.  5 days quarantine.  Ok to rtw in 5 days if tests -ve follow  Pt verbalized understanding of such To call if symptoms do not improve or get worse.   Problem List Items Addressed This Visit   None    No orders of the defined types were placed in this encounter.    Meds ordered this encounter  Medications   molnupiravir EUA (LAGEVRIO) 200 mg CAPS capsule    Sig: Take 4 capsules (800 mg total) by mouth 2 (two) times daily for 5 days.    Dispense:  40 capsule    Refill:  0   fexofenadine (ALLEGRA ALLERGY) 180 MG tablet    Sig: Take 1 tablet (180 mg total) by mouth daily.    Dispense:  10 tablet    Refill:  1     Follow up plan: No follow-ups on file.

## 2021-12-24 ENCOUNTER — Telehealth: Payer: Self-pay

## 2021-12-24 DIAGNOSIS — U071 COVID-19: Secondary | ICD-10-CM | POA: Insufficient documentation

## 2021-12-24 NOTE — Progress Notes (Signed)
I have called [employer] to check on the status of the patient assistance application on Jardiance. The representative stated they are missing the following pages 2 and 3 which is the insurance information, income information and signature pages. It would need to be faxed to [phone].

## 2022-01-07 NOTE — Progress Notes (Signed)
I contacted [employer] again to check on the status of the application. The representative stated her application has been approved from 12/24/21-12/07/22 and her medication has been received to the patient on Wednesday Jan 17 th 2023.  Christine Booth, [place]

## 2022-01-10 ENCOUNTER — Other Ambulatory Visit: Payer: Self-pay | Admitting: Nurse Practitioner

## 2022-01-10 NOTE — Telephone Encounter (Signed)
Requested Prescriptions  Pending Prescriptions Disp Refills   pantoprazole (PROTONIX) 40 MG tablet [Pharmacy Med Name: PANTOPRAZOLE SOD DR 40 MG TAB] 90 tablet 0    Sig: TAKE 1 TABLET BY MOUTH EVERY DAY     Gastroenterology: Proton Pump Inhibitors Passed - 01/10/2022  1:57 AM      Passed - Valid encounter within last 12 months    Recent Outpatient Visits          2 weeks ago [place], MD   1 month ago Type 2 diabetes mellitus with hyperglycemia, without long-term current use of insulin ([place])   [place], [place] T, NP   4 months ago Uncontrolled type 2 diabetes mellitus with hyperglycemia ([place])   [place], Jolene T, NP   7 months ago Uncontrolled type 2 diabetes mellitus with hyperglycemia ([place])   [place], Jolene T, NP   10 months ago Uncontrolled type 2 diabetes mellitus with hyperglycemia ([place])   [place], Barbaraann Faster, NP      Future Appointments            In 1 month [place], Barbaraann Faster, NP [facility], [facility]

## 2022-01-13 ENCOUNTER — Telehealth: Payer: Self-pay

## 2022-01-13 NOTE — Telephone Encounter (Signed)
1 box of Ozempic received for the patient. Called and notified her that it was ready to be picked up.

## 2022-01-15 NOTE — Telephone Encounter (Signed)
Medication picked up by the patient.  

## 2022-01-25 ENCOUNTER — Other Ambulatory Visit: Payer: Self-pay | Admitting: Nurse Practitioner

## 2022-01-25 NOTE — Telephone Encounter (Signed)
Requested medication (s) are due for refill today: yes  Requested medication (s) are on the active medication list: yes  Last refill:  10/29/21 78ml  Future visit scheduled: 03/07/22  Notes to clinic:  This medication can not be delegated, please assess.    Requested Prescriptions  Pending Prescriptions Disp Refills   fluticasone (FLONASE) 50 MCG/ACT nasal spray [Pharmacy Med Name: FLUTICASONE PROP 50 MCG SPRAY] 48 mL 0    Sig: SPRAY 2 SPRAYS INTO EACH NOSTRIL EVERY DAY     Not Delegated - Ear, Nose, and Throat: Nasal Preparations - Corticosteroids Failed - 01/25/2022  1:07 AM      Failed - This refill cannot be delegated      Passed - Valid encounter within last 12 months    Recent Outpatient Visits           1 month ago [place], MD   1 month ago Type 2 diabetes mellitus with hyperglycemia, without long-term current use of insulin ([place])   [place], [place] T, NP   4 months ago Uncontrolled type 2 diabetes mellitus with hyperglycemia ([place])   [place] Cannady, Jolene T, NP   7 months ago Uncontrolled type 2 diabetes mellitus with hyperglycemia ([place])   [place], Jolene T, NP   11 months ago Uncontrolled type 2 diabetes mellitus with hyperglycemia ([place])   [place], Barbaraann Faster, NP       Future Appointments             In 1 month Cannady, Barbaraann Faster, NP [facility]

## 2022-03-02 NOTE — Patient Instructions (Signed)

## 2022-03-07 ENCOUNTER — Ambulatory Visit (INDEPENDENT_AMBULATORY_CARE_PROVIDER_SITE_OTHER): Payer: Medicare Other | Admitting: Nurse Practitioner

## 2022-03-07 ENCOUNTER — Encounter: Payer: Self-pay | Admitting: Nurse Practitioner

## 2022-03-07 VITALS — BP 118/72 | HR 75 | Temp 98.3°F | Ht 61.0 in | Wt 159.8 lb

## 2022-03-07 DIAGNOSIS — K219 Gastro-esophageal reflux disease without esophagitis: Secondary | ICD-10-CM | POA: Diagnosis not present

## 2022-03-07 DIAGNOSIS — E1165 Type 2 diabetes mellitus with hyperglycemia: Secondary | ICD-10-CM | POA: Diagnosis not present

## 2022-03-07 DIAGNOSIS — E1169 Type 2 diabetes mellitus with other specified complication: Secondary | ICD-10-CM

## 2022-03-07 DIAGNOSIS — Z6830 Body mass index (BMI) 30.0-30.9, adult: Secondary | ICD-10-CM

## 2022-03-07 DIAGNOSIS — E1159 Type 2 diabetes mellitus with other circulatory complications: Secondary | ICD-10-CM

## 2022-03-07 DIAGNOSIS — D122 Benign neoplasm of ascending colon: Secondary | ICD-10-CM

## 2022-03-07 DIAGNOSIS — E6609 Other obesity due to excess calories: Secondary | ICD-10-CM

## 2022-03-07 DIAGNOSIS — E876 Hypokalemia: Secondary | ICD-10-CM

## 2022-03-07 DIAGNOSIS — E785 Hyperlipidemia, unspecified: Secondary | ICD-10-CM | POA: Diagnosis not present

## 2022-03-07 DIAGNOSIS — I152 Hypertension secondary to endocrine disorders: Secondary | ICD-10-CM

## 2022-03-07 LAB — MICROALBUMIN, URINE WAIVED
Creatinine, Urine Waived: 100 mg/dL (ref 10–300)
Microalb, Ur Waived: 30 mg/L — ABNORMAL HIGH (ref 0–19)
Microalb/Creat Ratio: <30 mg/g (ref <30)

## 2022-03-07 LAB — BAYER DCA HB A1C WAIVED: HB A1C (BAYER DCA - WAIVED): 7.2 % — ABNORMAL HIGH (ref 4.8–5.6)

## 2022-03-07 NOTE — Assessment & Plan Note (Signed)
Recheck level today, recent 3.7 and improving. ?

## 2022-03-07 NOTE — Assessment & Plan Note (Signed)
Chronic, ongoing with BP at goal.  Continue Olmesartan and Amlodipine at current dosing and adjust as needed.  CMP and CBC up to date.  Continue to monitor BP at home regularly and focus on DASH diet + regular exercise.  Return in 3 months. ?

## 2022-03-07 NOTE — Assessment & Plan Note (Signed)
Chronic, ongoing with A1c 7.2% (slight upward trend) and urine ALB 30 in March 2023 -- continue ARB for kidney protection.  At this time continue Ozempic and Jardiance, she is at max dose of each.   If ongoing elevation A1c could consider sulfonylurea at very low dose, discussed with patient, due to lower cost and wishes to avoid insulin.  Avoid Metformin as did not tolerate in past.  Recommend continue to check BS daily and return to regular exercise and diet focus to maintain A1C <7.  Return in 3 months. ?

## 2022-03-07 NOTE — Assessment & Plan Note (Signed)
Chronic, ongoing.  Continue current medication regimen and adjust as needed.  Lipid panel up to date. 

## 2022-03-07 NOTE — Assessment & Plan Note (Signed)
Refuses repeat colonoscopy, have discussed at length each visit. 

## 2022-03-07 NOTE — Assessment & Plan Note (Signed)
BMI 30.19.  Recommended eating smaller high protein, low fat meals more frequently and exercising 30 mins a day 5 times a week with a goal of 10-15lb weight loss in the next 3 months. Patient voiced their understanding and motivation to adhere to these recommendations. ? ?

## 2022-03-07 NOTE — Assessment & Plan Note (Signed)
Chronic, stable at this time.  Obtain mag level yearly, up to date.  Continue current medication regimen and collaboration with GI team.   ?

## 2022-03-07 NOTE — Progress Notes (Addendum)
? ?BP 118/72   Pulse 75   Temp 98.3 ?F (36.8 ?C) (Oral)   Ht $R'5\' 1"'Em$  (1.549 m)   Wt 159 lb 12.8 oz (72.5 kg)   LMP  (LMP Unknown)   SpO2 97%   BMI 30.19 kg/m?   ? ?Subjective:  ? ? Patient ID: Christine Booth, female    DOB: 09-02-1956, 66 y.o.   MRN: [id number] ? ?HPI: ?Christine Booth is a 66 y.o. female ? ?Chief Complaint  ?Patient presents with  ? Diabetes  ? Hyperlipidemia  ? Hypertension  ? Gastroesophageal Reflux  ? ?DIABETES ?Taking Jardiance 25 MG daily and Ozempic 2 MG weekly, she does get assistance with both medications via CCM team. Last visit A1c in December was 7%.  She does endorse enjoying sweets. ?  ?Did take Metformin initially, caused some severe constipation.   ?  ?Last colonoscopy was in 2018, she was to repeat in one year but did not attend this.  Reports fears about this, which PCP discussed at length with her importance of repeat screening to ensure no cancerous polyps present.  She reports wishes for no treatment if something were present.  Educated her on colon CA.  ?Hypoglycemic episodes:no ?Polydipsia/polyuria: no ?Visual disturbance: no ?Chest pain: no ?Paresthesias: no ?Glucose Monitoring: yes ?            Accucheck frequency: not often ?            Fasting glucose: 160-170 range at home ?            Post prandial: ?            Evening: ?            Before meals: ?Taking Insulin?: no ?            Long acting insulin: ?            Short acting insulin: ?Blood Pressure Monitoring: daily ?Retinal Examination: Up To Date  ?Foot Exam: Up to Date ?Pneumovax: Up to Date ?Influenza: Up to Date ?Aspirin: yes  ?  ?HYPERTENSION / HYPERLIPIDEMIA ?Taking Olmesartan 40 MG daily, Amlodipine 2.5 MG daily, and Rosuvastatin 40 MG daily.   ? ?Stopped HCTZ in past due to low K+, this showed improvement with discontinuation of HCTZ. ?Satisfied with current treatment? yes ?Duration of hypertension: chronic ?BP monitoring frequency: once every few weeks ?BP range: 120/70 range ?BP medication  side effects: no ?Duration of hyperlipidemia: chronic ?Cholesterol medication side effects: no ?Cholesterol supplements: none ?Medication compliance: good compliance ?Aspirin: yes ?Recent stressors: no ?Recurrent headaches: none ?Visual changes: no ?Palpitations: no ?Dyspnea: no ?Chest pain: no ?Lower extremity edema: no ?Dizzy/lightheaded: no  ?  ?GERD ?Taking Protonix daily.   ?GERD control status: stable  ?Satisfied with current treatment? yes ?Heartburn frequency: none ?Medication side effects: no  ?Medication compliance: stable ?Dysphagia: no ?Odynophagia:  no ?Hematemesis: no ?Blood in stool: no ?EGD: yes ? ?Relevant past medical, surgical, family and social history reviewed and updated as indicated. Interim medical history since our last visit reviewed. ?Allergies and medications reviewed and updated. ? ?Review of Systems  ?Constitutional:  Negative for activity change, appetite change, diaphoresis, fatigue and fever.  ?Respiratory:  Negative for cough, chest tightness and shortness of breath.   ?Cardiovascular:  Negative for chest pain, palpitations and leg swelling.  ?Gastrointestinal: Negative.   ?Endocrine: Negative for cold intolerance, heat intolerance, polydipsia, polyphagia and polyuria.  ?Neurological: Negative.   ?Psychiatric/Behavioral: Negative.    ? ?Per HPI  unless specifically indicated above ? ?   ?Objective:  ?  ?BP 118/72   Pulse 75   Temp 98.3 ?F (36.8 ?C) (Oral)   Ht _0  (1.549 m)   Wt 159 lb 12.8 oz (72.5 kg)   LMP  (LMP Unknown)   SpO2 97%   BMI 30.19 kg/m?   ?Wt Readings from Last 3 Encounters:  ?03/07/22 159 lb 12.8 oz (72.5 kg)  ?12/06/21 158 lb 6.4 oz (71.8 kg)  ?09/06/21 158 lb 12.8 oz (72 kg)  ?  ?Physical Exam ?Vitals and nursing note reviewed.  ?Constitutional:   ?   General: She is awake. She is not in acute distress. ?   Appearance: She is well-developed, well-groomed and overweight. She is not ill-appearing.  ?HENT:  ?   Head: Normocephalic.  ?   Right Ear: Hearing,  ear canal and external ear normal. No drainage.  ?   Left Ear: Hearing, ear canal and external ear normal. No drainage.  ?   Ears:  ?   Comments:  ? ?   Mouth/Throat:  ?   Mouth: Mucous membranes are moist.  ?Eyes:  ?   General: Lids are normal.     ?   Right eye: No discharge.     ?   Left eye: No discharge.  ?   Conjunctiva/sclera: Conjunctivae normal.  ?   Pupils: Pupils are equal, round, and reactive to light.  ?Neck:  ?   Thyroid: No thyromegaly.  ?   Vascular: No carotid bruit.  ?Cardiovascular:  ?   Rate and Rhythm: Normal rate and regular rhythm.  ?   Heart sounds: Normal heart sounds. No murmur heard. ?  No gallop.  ?Pulmonary:  ?   Effort: Pulmonary effort is normal. No accessory muscle usage or respiratory distress.  ?   Breath sounds: Normal breath sounds.  ?Abdominal:  ?   General: Bowel sounds are normal.  ?   Palpations: Abdomen is soft.  ?Musculoskeletal:  ?   Cervical back: Normal range of motion and neck supple.  ?   Right lower leg: No edema.  ?   Left lower leg: No edema.  ?Skin: ?   General: Skin is warm and dry.  ?Neurological:  ?   Mental Status: She is alert and oriented to person, place, and time.  ?Psychiatric:     ?   Attention and Perception: Attention normal.     ?   Mood and Affect: Mood normal.     ?   Behavior: Behavior normal. Behavior is cooperative.     ?   Thought Content: Thought content normal.     ?   Judgment: Judgment normal.  ? ?Diabetic Foot Exam - Simple   ?Simple Foot Form ?Visual Inspection ?No deformities, no ulcerations, no other skin breakdown bilaterally: Yes ?Sensation Testing ?Intact to touch and monofilament testing bilaterally: Yes ?Pulse Check ?Posterior Tibialis and Dorsalis pulse intact bilaterally: Yes ?Comments ?  ?  ?Results for orders placed or performed in visit on 12/06/21  ?CBC with Differential/Platelet  ?Result Value Ref Range  ? WBC 7.4 3.4 - 10.8 x10E3/uL  ? RBC 4.65 3.77 - 5.28 x10E6/uL  ? Hemoglobin 14.5 11.1 - 15.9 g/dL  ? Hematocrit 44.1 34.0 -  46.6 %  ? MCV 95 79 - 97 fL  ? MCH 31.2 26.6 - 33.0 pg  ? MCHC 32.9 31.5 - 35.7 g/dL  ? RDW 12.7 11.7 - 15.4 %  ? Platelets 227 150 -  450 x10E3/uL  ? Neutrophils 51 Not Estab. %  ? Lymphs 39 Not Estab. %  ? Monocytes 6 Not Estab. %  ? Eos 3 Not Estab. %  ? Basos 1 Not Estab. %  ? Neutrophils Absolute 3.7 1.4 - 7.0 x10E3/uL  ? Lymphocytes Absolute 2.9 0.7 - 3.1 x10E3/uL  ? Monocytes Absolute 0.5 0.1 - 0.9 x10E3/uL  ? EOS (ABSOLUTE) 0.2 0.0 - 0.4 x10E3/uL  ? Basophils Absolute 0.1 0.0 - 0.2 x10E3/uL  ? Immature Granulocytes 0 Not Estab. %  ? Immature Grans (Abs) 0.0 0.0 - 0.1 x10E3/uL  ?Comprehensive metabolic panel  ?Result Value Ref Range  ? Glucose 127 (H) 70 - 99 mg/dL  ? BUN 8 8 - 27 mg/dL  ? Creatinine, Ser 0.64 0.57 - 1.00 mg/dL  ? eGFR 97 >59 mL/min/1.73  ? BUN/Creatinine Ratio 13 12 - 28  ? Sodium 143 134 - 144 mmol/L  ? Potassium 3.7 3.5 - 5.2 mmol/L  ? Chloride 109 (H) 96 - 106 mmol/L  ? CO2 19 (L) 20 - 29 mmol/L  ? Calcium 9.2 8.7 - 10.3 mg/dL  ? Total Protein 6.6 6.0 - 8.5 g/dL  ? Albumin 4.5 3.8 - 4.8 g/dL  ? Globulin, Total 2.1 1.5 - 4.5 g/dL  ? Albumin/Globulin Ratio 2.1 1.2 - 2.2  ? Bilirubin Total 0.5 0.0 - 1.2 mg/dL  ? Alkaline Phosphatase 65 44 - 121 IU/L  ? AST 23 0 - 40 IU/L  ? ALT 21 0 - 32 IU/L  ?Lipid Panel w/o Chol/HDL Ratio  ?Result Value Ref Range  ? Cholesterol, Total 111 100 - 199 mg/dL  ? Triglycerides 92 0 - 149 mg/dL  ? HDL 57 >39 mg/dL  ? VLDL Cholesterol Cal 18 5 - 40 mg/dL  ? LDL Chol Calc (NIH) 36 0 - 99 mg/dL  ?Vitamin B12  ?Result Value Ref Range  ? Vitamin B-12 1,415 (H) 232 - 1,245 pg/mL  ?Microalbumin, Urine Waived  ?Result Value Ref Range  ? Microalb, Ur Waived 10 0 - 19 mg/L  ? Creatinine, Urine Waived 200 10 - 300 mg/dL  ? Microalb/Creat Ratio <30 <30 mg/g  ?Bayer DCA Hb A1c Waived  ?Result Value Ref Range  ? HB A1C (BAYER DCA - WAIVED) 7.0 (H) 4.8 - 5.6 %  ? ?   ?Assessment & Plan:  ? ?Problem List Items Addressed This Visit   ? ?  ? Cardiovascular and Mediastinum  ?  Hypertension associated with diabetes ([place])  ?  Chronic, ongoing with BP at goal.  Continue Olmesartan and Amlodipine at current dosing and adjust as needed.  CMP and CBC up to date.  Continue to monitor BP at home regularly

## 2022-03-08 LAB — BASIC METABOLIC PANEL
BUN/Creatinine Ratio: 15 (ref 12–28)
BUN: 10 mg/dL (ref 8–27)
CO2: 21 mmol/L (ref 20–29)
Calcium: 9.5 mg/dL (ref 8.7–10.3)
Chloride: 108 mmol/L — ABNORMAL HIGH (ref 96–106)
Creatinine, Ser: 0.66 mg/dL (ref 0.57–1.00)
Glucose: 155 mg/dL — ABNORMAL HIGH (ref 70–99)
Potassium: 3.3 mmol/L — ABNORMAL LOW (ref 3.5–5.2)
Sodium: 143 mmol/L (ref 134–144)
eGFR: 96 mL/min/{1.73_m2} (ref >59)

## 2022-03-08 NOTE — Progress Notes (Signed)
Contacted via [place] ? ? ?Good morning Christine Booth, your labs have returned.  Kidney function remains normal, creatinine and eGFR.  Potassium is a little low again.  I recommend increase potassium intake a little: dried fruit, raisin bran, avocados, yogurt, spinach.  We will recheck this next visit, as this drifts up and down for you.  Any questions? ?Keep being amazing!!  Thank you for allowing me to participate in your care.  I appreciate you. ?Kindest regards, ?Henrine Screws'

## 2022-03-19 ENCOUNTER — Telehealth: Payer: Self-pay

## 2022-03-19 NOTE — Chronic Care Management (AMB) (Signed)
? ? ?  Chronic Care Management ?Pharmacy Assistant  ? ?Name: Christine Booth  MRN: [id number] DOB: February 17, 1956 ? ?Reason for Encounter: Disease State Diabetes Mellitus ? ? ?Recent office visits:  ?03/07/22-Christine T. Ned Card, NP (PCP) General follow up visit. Labs ordered. Follow up in 3 months. ?12/23/21-Christine Vigg, MD (Video visit) Seen for COVID positive. Start on molnupiravir 800 mg bid x 5 days. ?12/06/21-Christine T. Ned Card, NP (PCP) Annual exam visit. Labs ordered. Follow up in 3 months. ? ?Recent consult visits:  ?None noted ? ?Hospital visits:  ?None in previous 6 months ? ?Medications: ?Outpatient Encounter Medications as of 03/19/2022  ?Medication Sig  ? amLODipine (NORVASC) 2.5 MG tablet Take 1 tablet (2.5 mg total) by mouth daily.  ? calcium carbonate (OSCAL) 1500 (600 Ca) MG TABS tablet Take 600 mg of elemental calcium by mouth daily.  ? empagliflozin (JARDIANCE) 25 MG TABS tablet Take 25 mg by mouth daily.  ? fexofenadine (ALLEGRA ALLERGY) 180 MG tablet Take 1 tablet (180 mg total) by mouth daily.  ? fluticasone (FLONASE) 50 MCG/ACT nasal spray SPRAY 2 SPRAYS INTO EACH NOSTRIL EVERY DAY  ? glucose blood test strip Use as instructed  ? meloxicam (MOBIC) 15 MG tablet TAKE 1 TABLET BY MOUTH EVERY DAY  ? methocarbamol (ROBAXIN) 500 MG tablet methocarbamol 500 mg tablet ? TAKE 1 TABLET 3 TIMES A DAY BY ORAL ROUTE AS NEEDED.  ? olmesartan (BENICAR) 40 MG tablet Take 1 tablet (40 mg total) by mouth daily.  ? pantoprazole (PROTONIX) 40 MG tablet TAKE 1 TABLET BY MOUTH EVERY DAY  ? rosuvastatin (CRESTOR) 40 MG tablet TAKE 1 TABLET BY MOUTH EVERY DAY  ? Semaglutide, 2 MG/DOSE, 8 MG/3ML SOPN Inject 2 mg into the skin once a week.  ? sennosides-docusate sodium (SENOKOT-S) 8.6-50 MG tablet Take 1 tablet by mouth daily as needed for constipation.  ? triamcinolone (KENALOG) 0.025 % cream APPLY TO AFFECTED AREA TWICE A DAY  ? ?No facility-administered encounter medications on file as of 03/19/2022.  ? ?Current  antihyperglycemic regimen:  ?Semaglutide, 2 MG/DOSE, 8 MG/3ML SOPN Inject 2 mg into the skin once a week ?Empagliflozin (JARDIANCE) 25 MG TABS tablet Take 25 mg by mouth daily ? ?Unsuccessful attempts to complete assessment call. I have called patient 3x and left 3 voicemail's for the patient to return my call when available. ? ? ? ?Adherence Review: ?Is the patient currently on a STATIN medication? Yes ?Is the patient currently on ACE/ARB medication? Yes ?Does the patient have >5 day gap between last estimated fill dates? Yes ? ? ?Care Gaps: ?None noted ? ?Star Rating Drugs: ?Rosuvastatin 40 mg Last filled:01/24/22 90 DS ?Olmesartan 40 mg Last filled:01/24/22 90 DS ?Jardiacne 25 mg Last filled:03/26/20 90 DS ? ?Christine Booth, RMA ?Health Concierge ? ?

## 2022-04-22 ENCOUNTER — Other Ambulatory Visit: Payer: Self-pay | Admitting: Nurse Practitioner

## 2022-04-22 DIAGNOSIS — I152 Hypertension secondary to endocrine disorders: Secondary | ICD-10-CM

## 2022-04-22 DIAGNOSIS — E1165 Type 2 diabetes mellitus with hyperglycemia: Secondary | ICD-10-CM

## 2022-04-23 NOTE — Telephone Encounter (Signed)
Requested Prescriptions  ?Pending Prescriptions Disp Refills  ?? olmesartan (BENICAR) 40 MG tablet [Pharmacy Med Name: OLMESARTAN MEDOXOMIL 40 MG TAB] 90 tablet 0  ?  Sig: TAKE 1 TABLET BY MOUTH EVERY DAY  ?  ? Cardiovascular:  Angiotensin Receptor Blockers Failed - 04/22/2022  2:10 AM  ?  ?  Failed - K in normal range and within 180 days  ?  Potassium  ?Date Value Ref Range Status  ?03/07/2022 3.3 (L) 3.5 - 5.2 mmol/L Final  ?   ?  ?  Passed - Cr in normal range and within 180 days  ?  Creatinine, Ser  ?Date Value Ref Range Status  ?03/07/2022 0.66 0.57 - 1.00 mg/dL Final  ?   ?  ?  Passed - Patient is not pregnant  ?  ?  Passed - Last BP in normal range  ?  BP Readings from Last 1 Encounters:  ?03/07/22 118/72  ?   ?  ?  Passed - Valid encounter within last 6 months  ?  Recent Outpatient Visits   ?      ? 1 month ago Type 2 diabetes mellitus with hyperglycemia, without long-term current use of insulin ([place])  ? [facility] [place], [place] T, NP  ? 4 months ago COVID  ? [facility] Vigg, Avanti, MD  ? 4 months ago Type 2 diabetes mellitus with hyperglycemia, without long-term current use of insulin ([place])  ? [place], [place] T, NP  ? 7 months ago Uncontrolled type 2 diabetes mellitus with hyperglycemia ([place])  ? [place], [place] T, NP  ? 10 months ago Uncontrolled type 2 diabetes mellitus with hyperglycemia ([place])  ? [facility] [place], Henrine Screws T, NP  ?  ?  ?Future Appointments   ?        ? In 1 month Cannady, Barbaraann Faster, NP [facility]  ?  ? ?  ?  ?  ?? rosuvastatin (CRESTOR) 40 MG tablet [Pharmacy Med Name: ROSUVASTATIN CALCIUM 40 MG TAB] 90 tablet 0  ?  Sig: TAKE 1 TABLET BY MOUTH EVERY DAY  ?  ? Cardiovascular:  Antilipid - Statins 2 Failed - 04/22/2022  2:10 AM  ?  ?  Failed - Lipid Panel in normal range within the last 12 months  ?  Cholesterol, Total  ?Date Value Ref Range Status  ?12/06/2021 111 100 - 199 mg/dL  Final  ? ?Cholesterol [place], [place]  ?Date Value Ref Range Status  ?05/04/2019 160 <200 mg/dL Final  ?  Comment:  ?                          Desirable                <200 ?                        Borderline High      200- 239 ?                        High                     >239 ?  ? ?LDL Chol Calc (NIH)  ?Date Value Ref Range Status  ?12/06/2021 36 0 - 99 mg/dL Final  ? ?HDL  ?Date Value Ref Range Status  ?12/06/2021 57 >39 mg/dL  Final  ? ?Triglycerides  ?Date Value Ref Range Status  ?12/06/2021 92 0 - 149 mg/dL Final  ? ?Triglycerides Piccolo,Waived  ?Date Value Ref Range Status  ?05/04/2019 108 <150 mg/dL Final  ?  Comment:  ?                          Normal                   <150 ?                        Borderline High     150 - 199 ?                        High                200 - 499 ?                        Very High                >499 ?  ? ?  ?  ?  Passed - Cr in normal range and within 360 days  ?  Creatinine, Ser  ?Date Value Ref Range Status  ?03/07/2022 0.66 0.57 - 1.00 mg/dL Final  ?   ?  ?  Passed - Patient is not pregnant  ?  ?  Passed - Valid encounter within last 12 months  ?  Recent Outpatient Visits   ?      ? 1 month ago Type 2 diabetes mellitus with hyperglycemia, without long-term current use of insulin ([place])  ? [facility] [place], [place] T, NP  ? 4 months ago COVID  ? [facility] Vigg, Avanti, MD  ? 4 months ago Type 2 diabetes mellitus with hyperglycemia, without long-term current use of insulin ([place])  ? [place], [place] T, NP  ? 7 months ago Uncontrolled type 2 diabetes mellitus with hyperglycemia ([place])  ? [place], [place] T, NP  ? 10 months ago Uncontrolled type 2 diabetes mellitus with hyperglycemia ([place])  ? [facility] [place], Henrine Screws T, NP  ?  ?  ?Future Appointments   ?        ? In 1 month Cannady, Barbaraann Faster, NP [facility]  ?  ? ?  ?  ?  ? ? ?

## 2022-04-26 ENCOUNTER — Encounter: Payer: Self-pay | Admitting: Nurse Practitioner

## 2022-05-07 ENCOUNTER — Telehealth: Payer: Self-pay

## 2022-05-07 NOTE — Chronic Care Management (AMB) (Signed)
    Chronic Care Management Pharmacy Assistant   Name: Odalys Win  MRN: [id number] DOB: 09/30/1956   Reason for Encounter: Patient assitance application on Ozempic    Medications: Outpatient Encounter Medications as of 05/07/2022  Medication Sig   amLODipine (NORVASC) 2.5 MG tablet Take 1 tablet (2.5 mg total) by mouth daily.   calcium carbonate (OSCAL) 1500 (600 Ca) MG TABS tablet Take 600 mg of elemental calcium by mouth daily.   empagliflozin (JARDIANCE) 25 MG TABS tablet Take 25 mg by mouth daily.   fexofenadine (ALLEGRA ALLERGY) 180 MG tablet Take 1 tablet (180 mg total) by mouth daily.   fluticasone (FLONASE) 50 MCG/ACT nasal spray SPRAY 2 SPRAYS INTO EACH NOSTRIL EVERY DAY   glucose blood test strip Use as instructed   meloxicam (MOBIC) 15 MG tablet TAKE 1 TABLET BY MOUTH EVERY DAY   methocarbamol (ROBAXIN) 500 MG tablet methocarbamol 500 mg tablet  TAKE 1 TABLET 3 TIMES A DAY BY ORAL ROUTE AS NEEDED.   olmesartan (BENICAR) 40 MG tablet TAKE 1 TABLET BY MOUTH EVERY DAY   pantoprazole (PROTONIX) 40 MG tablet TAKE 1 TABLET BY MOUTH EVERY DAY   rosuvastatin (CRESTOR) 40 MG tablet TAKE 1 TABLET BY MOUTH EVERY DAY   Semaglutide, 2 MG/DOSE, 8 MG/3ML SOPN Inject 2 mg into the skin once a week.   sennosides-docusate sodium (SENOKOT-S) 8.6-50 MG tablet Take 1 tablet by mouth daily as needed for constipation.   triamcinolone (KENALOG) 0.025 % cream APPLY TO AFFECTED AREA TWICE A DAY   No facility-administered encounter medications on file as of 05/07/2022.    I have prefilled and mailed out patient assitance application to patient. I have also given directions on the form to complete high lighted areas and to bring back to the office once completed and to also provide proof of income.   Corrie Mckusick, [place]

## 2022-05-12 ENCOUNTER — Telehealth: Payer: Self-pay | Admitting: Nurse Practitioner

## 2022-05-12 NOTE — Telephone Encounter (Signed)
Patient brought in  [employer] patient application that needs to be completd  by the provider. Application was placed in the providers folder for completion and to be sent to the [place].

## 2022-05-13 NOTE — Telephone Encounter (Signed)
Noted, will complete upon return. 

## 2022-05-13 NOTE — Telephone Encounter (Signed)
Forms received and placed in providers folder for when she returns.   Called and let the patient know that Jolene was out of the office this week and would sign the form when she returned. Patient verbalized understanding. Routing to provider and pharmacy team as an [place] for when [place] returns.

## 2022-05-20 NOTE — Telephone Encounter (Signed)
Completed and faxed back over to [facility].

## 2022-06-08 NOTE — Patient Instructions (Signed)
Food Choices to Help Relieve Diarrhea, Adult Diarrhea can make you feel weak and cause you to become dehydrated. It is important to choose the right foods and drinks to: Relieve diarrhea. Replace lost fluids and nutrients. Prevent dehydration. What are tips for following this plan? Relieving diarrhea Avoid foods that make your diarrhea worse. These may include: Foods and beverages sweetened with high-fructose corn syrup, honey, or sweeteners such as xylitol, sorbitol, and mannitol. Fried, greasy, or spicy foods. Raw fruits and vegetables. Eat foods that are rich in probiotics. These include foods such as yogurt and fermented milk products. Probiotics can help increase healthy bacteria in your stomach and intestines (gastrointestinal tract or GI tract). This may help digestion and stop diarrhea. If you have lactose intolerance, avoid dairy products. These may make your diarrhea worse. Take medicine to help stop diarrhea only as told by your health care provider. Replacing nutrients  Eat bland, easy-to-digest foods in small amounts as you are able, until your diarrhea starts to get better. These foods include bananas, applesauce, rice, toast, and crackers. Gradually reintroduce nutrient-rich foods as tolerated or as told by your health care provider. This includes: Well-cooked protein foods, such as eggs, lean meats like fish or chicken without skin, and tofu. Peeled, seeded, and soft-cooked fruits and vegetables. Low-fat dairy products. Whole grains. Take vitamin and mineral supplements as told by your health care provider. Preventing dehydration  Start by sipping water or a solution to prevent dehydration (oral rehydration solution, ORS). This is a drink that helps replace fluids and minerals your body has lost. You can buy an ORS at pharmacies and retail stores. Try to drink at least 8-10 cups (2,000-2,500 mL) of fluid each day to help replace lost fluids. If you have urine that is pale  yellow, you are getting enough fluids. You may drink other liquids in addition to water, such as fruit juice that you have added water to (diluted fruit juice) or low-calorie sports drinks, as tolerated or as told by your health care provider. Avoid drinks with caffeine, such as coffee, tea, or soft drinks. Avoid alcohol. Summary When you have diarrhea, it is important to choose the right foods and drinks to relieve diarrhea, to replace lost fluids and nutrients, and to prevent dehydration. Make sure you drink enough fluid to keep your urine pale yellow. You may benefit from eating bland foods at first. Gradually reintroduce healthy, nutrient-rich foods as tolerated or as told by your health care provider. Avoid foods that make your diarrhea worse, such as fried, greasy, or spicy foods. This information is not intended to replace advice given to you by your health care provider. Make sure you discuss any questions you have with your health care provider. Document Revised: 01/10/2020 Document Reviewed: 01/10/2020 Elsevier Patient Education  2023 Elsevier Inc.  

## 2022-06-13 ENCOUNTER — Ambulatory Visit (INDEPENDENT_AMBULATORY_CARE_PROVIDER_SITE_OTHER): Payer: Medicare Other | Admitting: Nurse Practitioner

## 2022-06-13 ENCOUNTER — Encounter: Payer: Self-pay | Admitting: Nurse Practitioner

## 2022-06-13 ENCOUNTER — Telehealth: Payer: Self-pay

## 2022-06-13 VITALS — BP 111/70 | HR 71 | Temp 97.8°F | Ht 61.0 in | Wt 158.2 lb

## 2022-06-13 DIAGNOSIS — E876 Hypokalemia: Secondary | ICD-10-CM | POA: Diagnosis not present

## 2022-06-13 DIAGNOSIS — E785 Hyperlipidemia, unspecified: Secondary | ICD-10-CM | POA: Diagnosis not present

## 2022-06-13 DIAGNOSIS — I152 Hypertension secondary to endocrine disorders: Secondary | ICD-10-CM | POA: Diagnosis not present

## 2022-06-13 DIAGNOSIS — E6609 Other obesity due to excess calories: Secondary | ICD-10-CM

## 2022-06-13 DIAGNOSIS — E1165 Type 2 diabetes mellitus with hyperglycemia: Secondary | ICD-10-CM

## 2022-06-13 DIAGNOSIS — Z6830 Body mass index (BMI) 30.0-30.9, adult: Secondary | ICD-10-CM

## 2022-06-13 DIAGNOSIS — E1159 Type 2 diabetes mellitus with other circulatory complications: Secondary | ICD-10-CM | POA: Diagnosis not present

## 2022-06-13 DIAGNOSIS — E1169 Type 2 diabetes mellitus with other specified complication: Secondary | ICD-10-CM | POA: Diagnosis not present

## 2022-06-13 DIAGNOSIS — D122 Benign neoplasm of ascending colon: Secondary | ICD-10-CM | POA: Diagnosis not present

## 2022-06-13 LAB — BAYER DCA HB A1C WAIVED: HB A1C (BAYER DCA - WAIVED): 7.6 % — ABNORMAL HIGH (ref 4.8–5.6)

## 2022-06-13 NOTE — Telephone Encounter (Signed)
4 boxes of Ozempic received for the patient.   Called and LVM notifying patient that her medication was ready to be picked up.

## 2022-06-13 NOTE — Assessment & Plan Note (Signed)
Chronic, ongoing.  Continue current medication regimen and adjust as needed. Lipid panel today. 

## 2022-06-13 NOTE — Assessment & Plan Note (Signed)
BMI 29.89 with some loss present now that is back at gym.  Recommended eating smaller high protein, low fat meals more frequently and exercising 30 mins a day 5 times a week with a goal of 10-15lb weight loss in the next 3 months. Patient voiced their understanding and motivation to adhere to these recommendations.

## 2022-06-13 NOTE — Assessment & Plan Note (Signed)
Chronic, ongoing with A1c 7.6% (upward trend) and urine ALB 30 in March 2023 -- continue ARB for kidney protection.   - At this time continue Ozempic and Jardiance, she is at max dose of each -- she prefers not to add any other medications at this time and to continue focus on gym routine which she just started, this is appropriate.    - If ongoing elevation A1c could consider sulfonylurea at very low dose, discussed with patient, due to lower cost and wishes to avoid insulin.   - Avoid Metformin as did not tolerate in past.   - Recommend continue to check BS daily and return to regular exercise and diet focus to maintain A1C <7.  Return in 3 months.

## 2022-06-13 NOTE — Assessment & Plan Note (Signed)
Refuses repeat colonoscopy, have discussed at length each visit.

## 2022-06-13 NOTE — Assessment & Plan Note (Signed)
Chronic, stable with BP remaining at goal.  Continue Olmesartan and Amlodipine at current dosing and adjust as needed.  LABS: BMP today.  Continue to monitor BP at home regularly and focus on DASH diet + regular exercise.  Return in 3 months.

## 2022-06-13 NOTE — Assessment & Plan Note (Signed)
Recheck level today, recent 3.3, on lower side and she is adding to diet.

## 2022-06-13 NOTE — Progress Notes (Signed)
BP 111/70   Pulse 71   Temp 97.8 F (36.6 C) (Oral)   Ht 5' 1"  (1.549 m)   Wt 158 lb 3.2 oz (71.8 kg)   LMP  (LMP Unknown)   SpO2 97%   BMI 29.89 kg/m    Subjective:    Patient ID: Christine Booth, female    DOB: December 03, 1956, 66 y.o.   MRN: [id number]  HPI: Christine Booth is a 66 y.o. female  Chief Complaint  Patient presents with   Diabetes    Patient denies having a recent Diabetic Eye Exam.    Hyperlipidemia   Hypertension   Gastroesophageal Reflux   DIABETES Taking Jardiance 25 MG daily and Ozempic 2 MG weekly, obtains via assistance with both medications -- CCM team. Last visit A1c was 7.2% in March.  She does endorse enjoying sweets.  Has started back to working out at gym 2 to 3 days for one hour on treadmill.   Did take Metformin initially, caused some severe constipation.   Hypoglycemic episodes:no Polydipsia/polyuria: no Visual disturbance: no Chest pain: no Paresthesias: no Glucose Monitoring: yes             Accucheck frequency: not checked recently             Fasting glucose:              Post prandial:             Evening:             Before meals: Taking Insulin?: no             Long acting insulin:             Short acting insulin: Blood Pressure Monitoring: daily Retinal Examination: Not Up To Date  Foot Exam: Up to Date Pneumovax: Up to Date Influenza: Up to Date Aspirin: yes    HYPERTENSION / HYPERLIPIDEMIA Taking Olmesartan 40 MG daily, Amlodipine 2.5 MG daily, and Rosuvastatin 40 MG daily.  Stopped HCTZ in past due to low K+, this showed improvement with discontinuation of HCTZ -- but recent level 3.3. Satisfied with current treatment? yes Duration of hypertension: chronic BP monitoring frequency: once every few weeks BP range: 110-120/70 range BP medication side effects: no Duration of hyperlipidemia: chronic Cholesterol medication side effects: no Cholesterol supplements: none Medication compliance: good  compliance Aspirin: yes Recent stressors: no Recurrent headaches: none Visual changes: no Palpitations: no Dyspnea: no Chest pain: no Lower extremity edema: no Dizzy/lightheaded: no    GERD Taking Protonix daily.  Last colonoscopy was in 2018, she was to repeat in one year but did not attend this.  Reports fears about this, which PCP discussed at length with her importance of repeat screening to ensure no cancerous polyps present.  She reports wishes for no treatment if something were present.  Educated her on colon CA.  GERD control status: stable  Satisfied with current treatment? yes Heartburn frequency: none Medication side effects: no  Medication compliance: stable Dysphagia: no Odynophagia:  no Hematemesis: no Blood in stool: no EGD: yes  Relevant past medical, surgical, family and social history reviewed and updated as indicated. Interim medical history since our last visit reviewed. Allergies and medications reviewed and updated.  Review of Systems  Constitutional:  Negative for activity change, appetite change, diaphoresis, fatigue and fever.  Respiratory:  Negative for cough, chest tightness and shortness of breath.   Cardiovascular:  Negative for chest pain, palpitations and leg swelling.  Gastrointestinal: Negative.   Endocrine: Negative for cold intolerance, heat intolerance, polydipsia, polyphagia and polyuria.  Neurological: Negative.   Psychiatric/Behavioral: Negative.     Per HPI unless specifically indicated above     Objective:    BP 111/70   Pulse 71   Temp 97.8 F (36.6 C) (Oral)   Ht 5' 1"  (1.549 m)   Wt 158 lb 3.2 oz (71.8 kg)   LMP  (LMP Unknown)   SpO2 97%   BMI 29.89 kg/m   Wt Readings from Last 3 Encounters:  06/13/22 158 lb 3.2 oz (71.8 kg)  03/07/22 159 lb 12.8 oz (72.5 kg)  12/06/21 158 lb 6.4 oz (71.8 kg)    Physical Exam Vitals and nursing note reviewed.  Constitutional:      General: She is awake. She is not in acute  distress.    Appearance: She is well-developed, well-groomed and overweight. She is not ill-appearing.  HENT:     Head: Normocephalic.     Right Ear: Hearing, ear canal and external ear normal. No drainage.     Left Ear: Hearing, ear canal and external ear normal. No drainage.     Ears:     Comments:      Mouth/Throat:     Mouth: Mucous membranes are moist.  Eyes:     General: Lids are normal.        Right eye: No discharge.        Left eye: No discharge.     Conjunctiva/sclera: Conjunctivae normal.     Pupils: Pupils are equal, round, and reactive to light.  Neck:     Thyroid: No thyromegaly.     Vascular: No carotid bruit.  Cardiovascular:     Rate and Rhythm: Normal rate and regular rhythm.     Heart sounds: Normal heart sounds. No murmur heard.    No gallop.  Pulmonary:     Effort: Pulmonary effort is normal. No accessory muscle usage or respiratory distress.     Breath sounds: Normal breath sounds.  Abdominal:     General: Bowel sounds are normal.     Palpations: Abdomen is soft.  Musculoskeletal:     Cervical back: Normal range of motion and neck supple.     Right lower leg: No edema.     Left lower leg: No edema.  Skin:    General: Skin is warm and dry.  Neurological:     Mental Status: She is alert and oriented to person, place, and time.  Psychiatric:        Attention and Perception: Attention normal.        Mood and Affect: Mood normal.        Behavior: Behavior normal. Behavior is cooperative.        Thought Content: Thought content normal.        Judgment: Judgment normal.    Results for orders placed or performed in visit on 03/07/22  Bayer DCA Hb A1c Waived  Result Value Ref Range   HB A1C (BAYER DCA - WAIVED) 7.2 (H) 4.8 - 5.6 %  Microalbumin, Urine Waived  Result Value Ref Range   Microalb, Ur Waived 30 (H) 0 - 19 mg/L   Creatinine, Urine Waived 100 10 - 300 mg/dL   Microalb/Creat Ratio <30 <30 mg/g  Basic metabolic panel  Result Value Ref Range    Glucose 155 (H) 70 - 99 mg/dL   BUN 10 8 - 27 mg/dL   Creatinine, Ser 0.66 0.57 -  1.00 mg/dL   eGFR 96 >59 mL/min/1.73   BUN/Creatinine Ratio 15 12 - 28   Sodium 143 134 - 144 mmol/L   Potassium 3.3 (L) 3.5 - 5.2 mmol/L   Chloride 108 (H) 96 - 106 mmol/L   CO2 21 20 - 29 mmol/L   Calcium 9.5 8.7 - 10.3 mg/dL      Assessment & Plan:   Problem List Items Addressed This Visit       Cardiovascular and Mediastinum   Hypertension associated with diabetes ([place])    Chronic, stable with BP remaining at goal.  Continue Olmesartan and Amlodipine at current dosing and adjust as needed.  LABS: BMP today.  Continue to monitor BP at home regularly and focus on DASH diet + regular exercise.  Return in 3 months.      Relevant Orders   Bayer DCA Hb A1c Waived   Basic metabolic panel     Digestive   Benign neoplasm of ascending colon    Refuses repeat colonoscopy, have discussed at length each visit.        Endocrine   Hyperlipidemia associated with type 2 diabetes mellitus (HCC)    Chronic, ongoing. Continue current medication regimen and adjust as needed.  Lipid panel today.      Relevant Orders   Bayer DCA Hb A1c Waived   Lipid Panel w/o Chol/HDL Ratio   Type 2 diabetes mellitus with hyperglycemia (HCC) - Primary    Chronic, ongoing with A1c 7.6% (upward trend) and urine ALB 30 in March 2023 -- continue ARB for kidney protection.   - At this time continue Ozempic and Jardiance, she is at max dose of each -- she prefers not to add any other medications at this time and to continue focus on gym routine which she just started, this is appropriate.    - If ongoing elevation A1c could consider sulfonylurea at very low dose, discussed with patient, due to lower cost and wishes to avoid insulin.   - Avoid Metformin as did not tolerate in past.   - Recommend continue to check BS daily and return to regular exercise and diet focus to maintain A1C <7.  Return in 3 months.      Relevant  Orders   Bayer DCA Hb A1c Waived     Other   Hypokalemia    Recheck level today, recent 3.3, on lower side and she is adding to diet.      Relevant Orders   Basic metabolic panel   Obesity    BMI 29.89 with some loss present now that is back at gym.  Recommended eating smaller high protein, low fat meals more frequently and exercising 30 mins a day 5 times a week with a goal of 10-15lb weight loss in the next 3 months. Patient voiced their understanding and motivation to adhere to these recommendations.         Follow up plan: Return in about 3 months (around 09/13/2022) for T2DM, HTN/HLD.

## 2022-06-13 NOTE — Telephone Encounter (Signed)
Medication picked up by the patient.  

## 2022-06-14 LAB — BASIC METABOLIC PANEL
BUN/Creatinine Ratio: 19 (ref 12–28)
BUN: 11 mg/dL (ref 8–27)
CO2: 19 mmol/L — ABNORMAL LOW (ref 20–29)
Calcium: 9.5 mg/dL (ref 8.7–10.3)
Chloride: 108 mmol/L — ABNORMAL HIGH (ref 96–106)
Creatinine, Ser: 0.57 mg/dL (ref 0.57–1.00)
Glucose: 151 mg/dL — ABNORMAL HIGH (ref 70–99)
Potassium: 3.5 mmol/L (ref 3.5–5.2)
Sodium: 141 mmol/L (ref 134–144)
eGFR: 100 mL/min/{1.73_m2} (ref >59)

## 2022-06-14 LAB — LIPID PANEL W/O CHOL/HDL RATIO
Cholesterol, Total: 116 mg/dL (ref 100–199)
HDL: 55 mg/dL
LDL Chol Calc (NIH): 38 mg/dL (ref 0–99)
Triglycerides: 136 mg/dL (ref 0–149)
VLDL Cholesterol Cal: 23 mg/dL (ref 5–40)

## 2022-06-14 NOTE — Progress Notes (Signed)
Contacted via [place] afternoon [name], your labs have returned.  Kidney function, creatinine and eGFR, remains normal.  Sugar level, glucose, is elevated as expected.  Focus on diet changes at home.  Cholesterol levels are at goal, continue Rosuvastatin.  Any questions? Keep being stellar!!  Thank you for allowing me to participate in your care.  I appreciate you. Kindest regards, Keyari Kleeman

## 2022-07-04 ENCOUNTER — Other Ambulatory Visit: Payer: Self-pay | Admitting: Nurse Practitioner

## 2022-07-04 NOTE — Telephone Encounter (Signed)
Requested Prescriptions  Pending Prescriptions Disp Refills  . pantoprazole (PROTONIX) 40 MG tablet [Pharmacy Med Name: PANTOPRAZOLE SOD DR 40 MG TAB] 90 tablet 1    Sig: TAKE 1 TABLET BY MOUTH EVERY DAY     Gastroenterology: Proton Pump Inhibitors Passed - 07/04/2022  2:15 AM      Passed - Valid encounter within last 12 months    Recent Outpatient Visits          3 weeks ago Type 2 diabetes mellitus with hyperglycemia, without long-term current use of insulin ([place])   [facility], Jolene T, NP   3 months ago Type 2 diabetes mellitus with hyperglycemia, without long-term current use of insulin ([place])   [place], Henrine Screws T, NP   6 months ago [place], MD   7 months ago Type 2 diabetes mellitus with hyperglycemia, without long-term current use of insulin ([place])   [place], Jolene T, NP   10 months ago Uncontrolled type 2 diabetes mellitus with hyperglycemia ([place])   [place], Barbaraann Faster, NP      Future Appointments            In 2 months Cannady, Barbaraann Faster, NP [facility]

## 2022-07-18 ENCOUNTER — Other Ambulatory Visit: Payer: Self-pay | Admitting: Nurse Practitioner

## 2022-07-18 DIAGNOSIS — E1159 Type 2 diabetes mellitus with other circulatory complications: Secondary | ICD-10-CM

## 2022-07-18 DIAGNOSIS — E1165 Type 2 diabetes mellitus with hyperglycemia: Secondary | ICD-10-CM

## 2022-07-18 NOTE — Telephone Encounter (Signed)
Requested Prescriptions  Pending Prescriptions Disp Refills  . olmesartan (BENICAR) 40 MG tablet [Pharmacy Med Name: OLMESARTAN MEDOXOMIL 40 MG TAB] 90 tablet 1    Sig: TAKE 1 TABLET BY MOUTH EVERY DAY     Cardiovascular:  Angiotensin Receptor Blockers Passed - 07/18/2022  2:26 AM      Passed - Cr in normal range and within 180 days    Creatinine, Ser  Date Value Ref Range Status  06/13/2022 0.57 0.57 - 1.00 mg/dL Final         Passed - K in normal range and within 180 days    Potassium  Date Value Ref Range Status  06/13/2022 3.5 3.5 - 5.2 mmol/L Final         Passed - Patient is not pregnant      Passed - Last BP in normal range    BP Readings from Last 1 Encounters:  06/13/22 111/70         Passed - Valid encounter within last 6 months    Recent Outpatient Visits          1 month ago Type 2 diabetes mellitus with hyperglycemia, without long-term current use of insulin ([place])   [place], Jolene T, NP   4 months ago Type 2 diabetes mellitus with hyperglycemia, without long-term current use of insulin ([place])   [place], Henrine Screws T, NP   6 months ago [place], MD   7 months ago Type 2 diabetes mellitus with hyperglycemia, without long-term current use of insulin ([place])   [place], Jolene T, NP   10 months ago Uncontrolled type 2 diabetes mellitus with hyperglycemia ([place])   [place], Barbaraann Faster, NP      Future Appointments            In 2 months [place], Barbaraann Faster, NP [facility], [facility]           . rosuvastatin (CRESTOR) 40 MG tablet [Pharmacy Med Name: ROSUVASTATIN CALCIUM 40 MG TAB] 90 tablet 3    Sig: TAKE 1 TABLET BY MOUTH EVERY DAY     Cardiovascular:  Antilipid - Statins 2 Failed - 07/18/2022  2:26 AM      Failed - Lipid Panel in normal range within the last 12 months    Cholesterol, Total  Date Value Ref Range Status   06/13/2022 116 100 - 199 mg/dL Final   Cholesterol Piccolo, Waived  Date Value Ref Range Status  05/04/2019 160 <200 mg/dL Final    Comment:                            Desirable                <200                         Borderline High      200- 239                         High                     >239    LDL Chol Calc (NIH)  Date Value Ref Range Status  06/13/2022 38 0 - 99 mg/dL Final   HDL  Date Value Ref Range Status  06/13/2022 55 >39 mg/dL Final   Triglycerides  Date Value Ref Range Status  06/13/2022 136 0 - 149 mg/dL Final   Triglycerides Piccolo,Waived  Date Value Ref Range Status  05/04/2019 108 <150 mg/dL Final    Comment:                            Normal                   <150                         Borderline High     150 - 199                         High                200 - 499                         Very High                >499          Passed - Cr in normal range and within 360 days    Creatinine, Ser  Date Value Ref Range Status  06/13/2022 0.57 0.57 - 1.00 mg/dL Final         Passed - Patient is not pregnant      Passed - Valid encounter within last 12 months    Recent Outpatient Visits          1 month ago Type 2 diabetes mellitus with hyperglycemia, without long-term current use of insulin ([place])   [place], Jolene T, NP   4 months ago Type 2 diabetes mellitus with hyperglycemia, without long-term current use of insulin ([place])   [place], Henrine Screws T, NP   6 months ago [place], MD   7 months ago Type 2 diabetes mellitus with hyperglycemia, without long-term current use of insulin ([place])   [place], Jolene T, NP   10 months ago Uncontrolled type 2 diabetes mellitus with hyperglycemia ([place])   [place], Barbaraann Faster, NP      Future Appointments            In 2 months Cannady, Barbaraann Faster, NP [facility]

## 2022-08-22 ENCOUNTER — Encounter: Payer: Self-pay | Admitting: Nurse Practitioner

## 2022-09-14 NOTE — Patient Instructions (Signed)

## 2022-09-19 ENCOUNTER — Encounter: Payer: Self-pay | Admitting: Nurse Practitioner

## 2022-09-19 ENCOUNTER — Ambulatory Visit (INDEPENDENT_AMBULATORY_CARE_PROVIDER_SITE_OTHER): Payer: Medicare Other | Admitting: Nurse Practitioner

## 2022-09-19 VITALS — BP 119/70 | HR 68 | Temp 98.3°F | Ht 61.0 in | Wt 158.6 lb

## 2022-09-19 DIAGNOSIS — R011 Cardiac murmur, unspecified: Secondary | ICD-10-CM | POA: Diagnosis not present

## 2022-09-19 DIAGNOSIS — K219 Gastro-esophageal reflux disease without esophagitis: Secondary | ICD-10-CM

## 2022-09-19 DIAGNOSIS — E1165 Type 2 diabetes mellitus with hyperglycemia: Secondary | ICD-10-CM | POA: Diagnosis not present

## 2022-09-19 DIAGNOSIS — I152 Hypertension secondary to endocrine disorders: Secondary | ICD-10-CM

## 2022-09-19 DIAGNOSIS — Z1231 Encounter for screening mammogram for malignant neoplasm of breast: Secondary | ICD-10-CM | POA: Diagnosis not present

## 2022-09-19 DIAGNOSIS — E785 Hyperlipidemia, unspecified: Secondary | ICD-10-CM | POA: Diagnosis not present

## 2022-09-19 DIAGNOSIS — E6609 Other obesity due to excess calories: Secondary | ICD-10-CM

## 2022-09-19 DIAGNOSIS — E1169 Type 2 diabetes mellitus with other specified complication: Secondary | ICD-10-CM

## 2022-09-19 DIAGNOSIS — E876 Hypokalemia: Secondary | ICD-10-CM | POA: Diagnosis not present

## 2022-09-19 DIAGNOSIS — Z6830 Body mass index (BMI) 30.0-30.9, adult: Secondary | ICD-10-CM

## 2022-09-19 DIAGNOSIS — E1159 Type 2 diabetes mellitus with other circulatory complications: Secondary | ICD-10-CM | POA: Diagnosis not present

## 2022-09-19 LAB — BAYER DCA HB A1C WAIVED: HB A1C (BAYER DCA - WAIVED): 7.4 % — ABNORMAL HIGH (ref 4.8–5.6)

## 2022-09-19 NOTE — Assessment & Plan Note (Signed)
Recheck level today, recent 3.5, stable and she is adding to diet.

## 2022-09-19 NOTE — Assessment & Plan Note (Signed)
Chronic, ongoing with A1c 7.4% (downward trend) and urine ALB 30 in March 2023 -- continue ARB for kidney protection.   - At this time continue Ozempic and Jardiance, she is at max dose of each -- she prefers not to add any other medications at this time and to continue focus on walking routine and diet, this is appropriate.    - If ongoing elevation A1c could consider sulfonylurea at very low dose, discussed with patient, due to lower cost and wishes to avoid insulin.   - Avoid Metformin as did not tolerate in past.   - Recommend continue to check BS daily and return to regular exercise and diet focus to maintain A1C <7.  Return in 3 months.

## 2022-09-19 NOTE — Assessment & Plan Note (Addendum)
Chronic, stable.  Continue current medication regimen and adjust as needed.  Lipid panel today. 

## 2022-09-19 NOTE — Assessment & Plan Note (Signed)
No symptoms, present for years.  Will consider echo if symptoms present -- discussed with patient. 

## 2022-09-19 NOTE — Progress Notes (Addendum)
BP 119/70   Pulse 68   Temp 98.3 F (36.8 C) (Oral)   Ht '5\' 1"'$  (1.549 m)   Wt 158 lb 9.6 oz (71.9 kg)   LMP  (LMP Unknown)   SpO2 97%   BMI 29.97 kg/m    Subjective:    Patient ID: Christine Booth, female    DOB: November 02, 1956, 66 y.o.   MRN: [id number]  HPI: Christine Booth is a 66 y.o. female  Chief Complaint  Patient presents with   Diabetes    Patient declines having a recent Diabetic Eye Exam.    Hyperlipidemia   Hypertension   DIABETES A1c at last visit in July was 7.6%.  Taking Jardiance 25 MG daily and Ozempic 2 MG weekly, obtains via assistance with both medications -- CCM team. She preferred not to add further medication at last visit and wished to work on diet and exercise.   Did take Metformin initially, caused severe constipation.   Hypoglycemic episodes:no Polydipsia/polyuria: no Visual disturbance: no Chest pain: no Paresthesias: no Glucose Monitoring: yes             Accucheck frequency: not checked recently             Fasting glucose:              Post prandial:             Evening:             Before meals: Taking Insulin?: no             Long acting insulin:             Short acting insulin: Blood Pressure Monitoring: daily Retinal Examination: Not Up To Date -- [facility] [place] Foot Exam: Up to Date Pneumovax: Up to Date Influenza: Up to Date Aspirin: yes    HYPERTENSION / HYPERLIPIDEMIA Continues Olmesartan 40 MG daily, Amlodipine 2.5 MG daily, and Rosuvastatin 40 MG daily. Was on HCTZ in past but this caused low K+. Satisfied with current treatment? yes Duration of hypertension: chronic BP monitoring frequency: occasional BP range: 11070 range BP medication side effects: no Duration of hyperlipidemia: chronic Cholesterol medication side effects: no Cholesterol supplements: none Medication compliance: good compliance Aspirin: yes Recent stressors: no Recurrent headaches: none Visual changes: no Palpitations: no Dyspnea:  no Chest pain: no Lower extremity edema: no Dizzy/lightheaded: no    GERD Continues Protonix daily -- refuses repeat colonoscopy. GERD control status: stable Satisfied with current treatment? yes Heartburn frequency: none Medication side effects: no  Medication compliance: stable Dysphagia: no Odynophagia:  no Hematemesis: no Blood in stool: no EGD: yes  Relevant past medical, surgical, family and social history reviewed and updated as indicated. Interim medical history since our last visit reviewed. Allergies and medications reviewed and updated.  Review of Systems  Constitutional:  Negative for activity change, appetite change, diaphoresis, fatigue and fever.  Respiratory:  Negative for cough, chest tightness and shortness of breath.   Cardiovascular:  Negative for chest pain, palpitations and leg swelling.  Gastrointestinal: Negative.   Endocrine: Negative for cold intolerance, heat intolerance, polydipsia, polyphagia and polyuria.  Neurological: Negative.   Psychiatric/Behavioral: Negative.     Per HPI unless specifically indicated above     Objective:    BP 119/70   Pulse 68   Temp 98.3 F (36.8 C) (Oral)   Ht '5\' 1"'$  (1.549 m)   Wt 158 lb 9.6 oz (71.9 kg)   LMP  (LMP Unknown)  SpO2 97%   BMI 29.97 kg/m   Wt Readings from Last 3 Encounters:  09/19/22 158 lb 9.6 oz (71.9 kg)  06/13/22 158 lb 3.2 oz (71.8 kg)  03/07/22 159 lb 12.8 oz (72.5 kg)    Physical Exam Vitals and nursing note reviewed.  Constitutional:      General: She is awake. She is not in acute distress.    Appearance: She is well-developed, well-groomed and overweight. She is not ill-appearing.  HENT:     Head: Normocephalic.     Right Ear: Hearing, ear canal and external ear normal. No drainage.     Left Ear: Hearing, ear canal and external ear normal. No drainage.     Ears:     Comments:      Mouth/Throat:     Mouth: Mucous membranes are moist.  Eyes:     General: Lids are normal.         Right eye: No discharge.        Left eye: No discharge.     Conjunctiva/sclera: Conjunctivae normal.     Pupils: Pupils are equal, round, and reactive to light.  Neck:     Thyroid: No thyromegaly.     Vascular: No carotid bruit.  Cardiovascular:     Rate and Rhythm: Normal rate and regular rhythm.     Heart sounds: Murmur heard.     Systolic murmur is present with a grade of 2/6.     No gallop.  Pulmonary:     Effort: Pulmonary effort is normal. No accessory muscle usage or respiratory distress.     Breath sounds: Normal breath sounds.  Abdominal:     General: Bowel sounds are normal.     Palpations: Abdomen is soft.  Musculoskeletal:     Cervical back: Normal range of motion and neck supple.     Right lower leg: No edema.     Left lower leg: No edema.  Skin:    General: Skin is warm and dry.  Neurological:     Mental Status: She is alert and oriented to person, place, and time.  Psychiatric:        Attention and Perception: Attention normal.        Mood and Affect: Mood normal.        Behavior: Behavior normal. Behavior is cooperative.        Thought Content: Thought content normal.        Judgment: Judgment normal.    Results for orders placed or performed in visit on 09/19/22  Bayer DCA Hb A1c Waived  Result Value Ref Range   HB A1C (BAYER DCA - WAIVED) 7.4 (H) 4.8 - 5.6 %      Assessment & Plan:   Problem List Items Addressed This Visit       Cardiovascular and Mediastinum   Hypertension associated with diabetes (HCC)    Chronic, stable.  BP well below goal on exam.  Continue Olmesartan and Amlodipine at current dosing and adjust as needed.  LABS: CMP and TSH today.  Continue to monitor BP at home regularly and focus on DASH diet + regular exercise.  Return in 3 months.      Relevant Orders   Bayer DCA Hb A1c Waived (Completed)   Comprehensive metabolic panel   TSH     Digestive   GERD (gastroesophageal reflux disease)    Chronic, stable at this time.   Obtain mag level yearly, check today.  Continue current medication regimen and  collaboration with GI team as needed.  She refuses repeat colonoscopy.      Relevant Orders   Magnesium     Endocrine   Hyperlipidemia associated with type 2 diabetes mellitus (HCC)    Chronic, stable. Continue current medication regimen and adjust as needed.  Lipid panel today.      Relevant Orders   Bayer DCA Hb A1c Waived (Completed)   Comprehensive metabolic panel   Lipid Panel w/o Chol/HDL Ratio   Type 2 diabetes mellitus with hyperglycemia (HCC) - Primary    Chronic, ongoing with A1c 7.4% (downward trend) and urine ALB 30 in March 2023 -- continue ARB for kidney protection.   - At this time continue Ozempic and Jardiance, she is at max dose of each -- she prefers not to add any other medications at this time and to continue focus on walking routine and diet, this is appropriate.    - If ongoing elevation A1c could consider sulfonylurea at very low dose, discussed with patient, due to lower cost and wishes to avoid insulin.   - Avoid Metformin as did not tolerate in past.   - Recommend continue to check BS daily and return to regular exercise and diet focus to maintain A1C <7.  Return in 3 months.      Relevant Orders   Bayer DCA Hb A1c Waived (Completed)     Other   Hypokalemia    Recheck level today, recent 3.5, stable and she is adding to diet.      Relevant Orders   Comprehensive metabolic panel   Obesity    BMI 29.97 is working on exercise regimen.  Recommended eating smaller high protein, low fat meals more frequently and exercising 30 mins a day 5 times a week with a goal of 10-15lb weight loss in the next 3 months. Patient voiced their understanding and motivation to adhere to these recommendations.       Systolic murmur    No symptoms, present for years.  Will consider echo if symptoms present -- discussed with patient.      Other Visit Diagnoses     Encounter for screening  mammogram for malignant neoplasm of breast       Mammogram ordered today.   Relevant Orders   MM 3D SCREEN BREAST BILATERAL        Follow up plan: Return in about 3 months (around 12/20/2022) for Annual physical + diabetes check.

## 2022-09-19 NOTE — Assessment & Plan Note (Signed)
BMI 29.97 is working on exercise regimen.  Recommended eating smaller high protein, low fat meals more frequently and exercising 30 mins a day 5 times a week with a goal of 10-15lb weight loss in the next 3 months. Patient voiced their understanding and motivation to adhere to these recommendations.

## 2022-09-19 NOTE — Assessment & Plan Note (Signed)
Chronic, stable.  BP well below goal on exam.  Continue Olmesartan and Amlodipine at current dosing and adjust as needed.  LABS: CMP and TSH today.  Continue to monitor BP at home regularly and focus on DASH diet + regular exercise.  Return in 3 months.

## 2022-09-19 NOTE — Assessment & Plan Note (Signed)
Chronic, stable at this time.  Obtain mag level yearly, check today.  Continue current medication regimen and collaboration with GI team as needed.  She refuses repeat colonoscopy.

## 2022-09-20 ENCOUNTER — Other Ambulatory Visit: Payer: Self-pay | Admitting: Nurse Practitioner

## 2022-09-20 DIAGNOSIS — E876 Hypokalemia: Secondary | ICD-10-CM

## 2022-09-20 LAB — LIPID PANEL W/O CHOL/HDL RATIO
Cholesterol, Total: 97 mg/dL — ABNORMAL LOW (ref 100–199)
HDL: 56 mg/dL (ref >39)
LDL Chol Calc (NIH): 24 mg/dL (ref 0–99)
Triglycerides: 87 mg/dL (ref 0–149)
VLDL Cholesterol Cal: 17 mg/dL (ref 5–40)

## 2022-09-20 LAB — COMPREHENSIVE METABOLIC PANEL
ALT: 15 IU/L (ref 0–32)
AST: 20 IU/L (ref 0–40)
Albumin/Globulin Ratio: 2.2 (ref 1.2–2.2)
Albumin: 4.6 g/dL (ref 3.9–4.9)
Alkaline Phosphatase: 58 IU/L (ref 44–121)
BUN/Creatinine Ratio: 15 (ref 12–28)
BUN: 9 mg/dL (ref 8–27)
Bilirubin Total: 0.4 mg/dL (ref 0.0–1.2)
CO2: 23 mmol/L (ref 20–29)
Calcium: 9.6 mg/dL (ref 8.7–10.3)
Chloride: 107 mmol/L — ABNORMAL HIGH (ref 96–106)
Creatinine, Ser: 0.61 mg/dL (ref 0.57–1.00)
Globulin, Total: 2.1 g/dL (ref 1.5–4.5)
Glucose: 137 mg/dL — ABNORMAL HIGH (ref 70–99)
Potassium: 3.1 mmol/L — ABNORMAL LOW (ref 3.5–5.2)
Sodium: 143 mmol/L (ref 134–144)
Total Protein: 6.7 g/dL (ref 6.0–8.5)
eGFR: 98 mL/min/{1.73_m2} (ref >59)

## 2022-09-20 LAB — TSH: TSH: 1.44 u[IU]/mL (ref 0.450–4.500)

## 2022-09-20 LAB — MAGNESIUM: Magnesium: 2 mg/dL (ref 1.6–2.3)

## 2022-09-20 NOTE — Progress Notes (Signed)
Contacted via [place] -- lab only visit in 2 weeks  Good afternoon [name], your labs have returned: - Kidney function, creatinine and eGFR, remains normal, as is liver function, AST and ALT. Potassium level is on low side again, I do recommend increasing potassium in your diet -- similar to before.  I would like to recheck this in 2 weeks to see if we need to add a potassium supplement to maintain levels.   - Remainder of labs are fabulous.  No medication changes needed.  Have a wonderful day!!  Any questions? Keep being awesome!!  Thank you for allowing me to participate in your care.  I appreciate you. Kindest regards, Gavino Fouch

## 2022-09-23 ENCOUNTER — Telehealth: Payer: Self-pay

## 2022-09-23 NOTE — Telephone Encounter (Signed)
Called and left VM telling patient where to call for Ozempic PAP refills

## 2022-10-03 ENCOUNTER — Other Ambulatory Visit: Payer: Medicare Other

## 2022-10-03 DIAGNOSIS — E876 Hypokalemia: Secondary | ICD-10-CM

## 2022-10-04 LAB — BASIC METABOLIC PANEL
BUN/Creatinine Ratio: 17 (ref 12–28)
BUN: 10 mg/dL (ref 8–27)
CO2: 21 mmol/L (ref 20–29)
Calcium: 9.1 mg/dL (ref 8.7–10.3)
Chloride: 105 mmol/L (ref 96–106)
Creatinine, Ser: 0.6 mg/dL (ref 0.57–1.00)
Glucose: 240 mg/dL — ABNORMAL HIGH (ref 70–99)
Potassium: 3.4 mmol/L — ABNORMAL LOW (ref 3.5–5.2)
Sodium: 141 mmol/L (ref 134–144)
eGFR: 98 mL/min/{1.73_m2} (ref >59)

## 2022-10-05 ENCOUNTER — Other Ambulatory Visit: Payer: Self-pay | Admitting: Nurse Practitioner

## 2022-10-05 MED ORDER — POTASSIUM CHLORIDE CRYS ER 10 MEQ PO TBCR: 10.0000 meq | EXTENDED_RELEASE_TABLET | Freq: Every day | ORAL | 0 refills | Status: DC

## 2022-10-05 NOTE — Progress Notes (Signed)
Contacted via [place] afternoon [name], your labs have returned and potassium remains a little on low side at 3.4, trending up but still a little low.  I am sending in a potassium tablet for you to take daily until our next visit and then we will recheck level.  Any questions? Keep being wonderful!!  Thank you for allowing me to participate in your care.  I appreciate you. Kindest regards, Shanitha Twining

## 2022-10-10 DIAGNOSIS — Z1231 Encounter for screening mammogram for malignant neoplasm of breast: Secondary | ICD-10-CM | POA: Diagnosis not present

## 2022-10-14 ENCOUNTER — Telehealth: Payer: Self-pay

## 2022-10-14 NOTE — Telephone Encounter (Signed)
3 boxes of Ozempic received for the patient. Called and notified her that it was ready to be picked up.

## 2022-10-15 NOTE — Telephone Encounter (Signed)
Medication picked up by the patient.  

## 2022-11-09 ENCOUNTER — Other Ambulatory Visit: Payer: Self-pay | Admitting: Nurse Practitioner

## 2022-11-10 NOTE — Telephone Encounter (Signed)
Requested Prescriptions  Pending Prescriptions Disp Refills   amLODipine (NORVASC) 2.5 MG tablet [Pharmacy Med Name: AMLODIPINE BESYLATE 2.5 MG TAB] 90 tablet 4    Sig: TAKE 1 TABLET BY MOUTH EVERY DAY     Cardiovascular: Calcium Channel Blockers 2 Passed - 11/09/2022  1:21 AM      Passed - Last BP in normal range    BP Readings from Last 1 Encounters:  09/19/22 119/70         Passed - Last Heart Rate in normal range    Pulse Readings from Last 1 Encounters:  09/19/22 68         Passed - Valid encounter within last 6 months    Recent Outpatient Visits           1 month ago Type 2 diabetes mellitus with hyperglycemia, without long-term current use of insulin ([place])   [place], Jolene T, NP   5 months ago Type 2 diabetes mellitus with hyperglycemia, without long-term current use of insulin ([place])   [place], Jolene T, NP   8 months ago Type 2 diabetes mellitus with hyperglycemia, without long-term current use of insulin ([place])   [place], Henrine Screws T, NP   10 months ago [place], MD   11 months ago Type 2 diabetes mellitus with hyperglycemia, without long-term current use of insulin ([place])   [place], Barbaraann Faster, NP       Future Appointments             In 1 month [place], Barbaraann Faster, NP [facility]

## 2022-11-11 ENCOUNTER — Encounter: Payer: Self-pay | Admitting: Nurse Practitioner

## 2022-11-12 ENCOUNTER — Encounter: Payer: Self-pay | Admitting: Family Medicine

## 2022-11-12 ENCOUNTER — Telehealth (INDEPENDENT_AMBULATORY_CARE_PROVIDER_SITE_OTHER): Payer: Medicare Other | Admitting: Family Medicine

## 2022-11-12 DIAGNOSIS — U071 COVID-19: Secondary | ICD-10-CM

## 2022-11-12 MED ORDER — PREDNISONE 20 MG PO TABS: 40.0000 mg | ORAL_TABLET | Freq: Every day | ORAL | 0 refills | Status: DC

## 2022-11-12 NOTE — Progress Notes (Signed)
LMP  (LMP Unknown)    Subjective:    Patient ID: Christine Booth, female    DOB: 1956-06-19, 66 y.o.   MRN: [id number]  HPI: Christine Booth is a 66 y.o. female  Chief Complaint  Patient presents with   Covid Positive    Pt states she has had congestion, headache, sinus pressure, cough, and drainage since Sunday. States she took a home coivd test and it was positive. Has taken Paxlovid in the past.    UPPER RESPIRATORY TRACT INFECTION Duration: 4 days Worst symptom: congestion Fever: no Cough: yes Shortness of breath: no Wheezing: no Chest pain: no Chest tightness: no Chest congestion: yes Nasal congestion: yes Runny nose: yes Post nasal drip: yes Sneezing: no Sore throat: yes Swollen glands: no Sinus pressure: yes Headache: yes Face pain: no Toothache: no Ear pain: no  Ear pressure: yes "right Eyes red/itching:no Eye drainage/crusting: no  Vomiting: no Rash: no Fatigue: yes Sick contacts: yes Strep contacts: no  Context: worse Recurrent sinusitis: no Relief with OTC cold/cough medications: no  Treatments attempted: none    Relevant past medical, surgical, family and social history reviewed and updated as indicated. Interim medical history since our last visit reviewed. Allergies and medications reviewed and updated.  Review of Systems  Constitutional: Negative.   HENT:  Positive for congestion, postnasal drip, rhinorrhea and sinus pressure. Negative for dental problem, drooling, ear discharge, ear pain, facial swelling, hearing loss, mouth sores, nosebleeds, sinus pain, sneezing, sore throat, tinnitus, trouble swallowing and voice change.   Respiratory:  Positive for cough. Negative for apnea, choking, chest tightness, shortness of breath, wheezing and stridor.   Cardiovascular: Negative.   Gastrointestinal: Negative.   Psychiatric/Behavioral: Negative.      Per HPI unless specifically indicated above     Objective:    LMP  (LMP Unknown)    Wt Readings from Last 3 Encounters:  09/19/22 158 lb 9.6 oz (71.9 kg)  06/13/22 158 lb 3.2 oz (71.8 kg)  03/07/22 159 lb 12.8 oz (72.5 kg)    Physical Exam Vitals and nursing note reviewed.  Constitutional:      General: She is not in acute distress.    Appearance: Normal appearance. She is not ill-appearing, toxic-appearing or diaphoretic.  HENT:     Head: Normocephalic and atraumatic.     Right Ear: External ear normal.     Left Ear: External ear normal.     Nose: Nose normal.     Mouth/Throat:     Mouth: Mucous membranes are moist.     Pharynx: Oropharynx is clear.  Eyes:     General: No scleral icterus.       Right eye: No discharge.        Left eye: No discharge.     Conjunctiva/sclera: Conjunctivae normal.     Pupils: Pupils are equal, round, and reactive to light.  Pulmonary:     Effort: Pulmonary effort is normal. No respiratory distress.     Comments: Speaking in full sentences Musculoskeletal:        General: Normal range of motion.     Cervical back: Normal range of motion.  Skin:    Coloration: Skin is not jaundiced or pale.     Findings: No bruising, erythema, lesion or rash.  Neurological:     Mental Status: She is alert and oriented to person, place, and time. Mental status is at baseline.  Psychiatric:        Mood and Affect: Mood  normal.        Behavior: Behavior normal.        Thought Content: Thought content normal.        Judgment: Judgment normal.     Results for orders placed or performed in visit on 53/97/67  Basic Metabolic Panel (BMET)  Result Value Ref Range   Glucose 240 (H) 70 - 99 mg/dL   BUN 10 8 - 27 mg/dL   Creatinine, Ser 0.60 0.57 - 1.00 mg/dL   eGFR 98 >59 mL/min/1.73   BUN/Creatinine Ratio 17 12 - 28   Sodium 141 134 - 144 mmol/L   Potassium 3.4 (L) 3.5 - 5.2 mmol/L   Chloride 105 96 - 106 mmol/L   CO2 21 20 - 29 mmol/L   Calcium 9.1 8.7 - 10.3 mg/dL      Assessment & Plan:   Problem List Items Addressed This Visit    None Visit Diagnoses     COVID    -  Primary   Would like to hold on antiviral given mild symptoms. Rx for prednisone sent to pharmacy. Call if not getting better or getting worse.        Follow up plan: Return if symptoms worsen or fail to improve.   This visit was completed via video visit through MyChart due to the restrictions of the COVID-19 pandemic. All issues as above were discussed and addressed. Physical exam was done as above through visual confirmation on video through MyChart. If it was felt that the patient should be evaluated in the office, they were directed there. The patient verbally consented to this visit. Location of the patient: home Location of the provider: work Those involved with this call:  Provider: Park Liter, DO CMA: Irena Reichmann, [place] Desk/Registration: [employer]  Time spent on call:  15 minutes with patient face to face via video conference. More than 50% of this time was spent in counseling and coordination of care. 23 minutes total spent in review of patient's record and preparation of their chart.

## 2022-11-17 ENCOUNTER — Telehealth: Payer: Self-pay | Admitting: Nurse Practitioner

## 2022-11-17 NOTE — Telephone Encounter (Signed)
Patient dropped of patients assistance application for [employer] and [place].Applications were put it the providers folder for completion.

## 2022-11-17 NOTE — Telephone Encounter (Signed)
Placed in [place] folder

## 2022-11-18 NOTE — Telephone Encounter (Signed)
Pharmacy team notified via teams 

## 2022-11-18 NOTE — Telephone Encounter (Signed)
Paperwork signed and placed in Pharmacy folder

## 2022-11-28 NOTE — Progress Notes (Signed)
Contacted [facility] cares  to follow up on patient assistance application for Jardiance. Per representative at [facility] cares states patient was denied due to being over the income.  [place] [phone]

## 2022-12-06 ENCOUNTER — Encounter: Payer: Self-pay | Admitting: Nurse Practitioner

## 2022-12-16 ENCOUNTER — Telehealth: Payer: Self-pay | Admitting: Nurse Practitioner

## 2022-12-16 MED ORDER — SEMAGLUTIDE (2 MG/DOSE) 8 MG/3ML ~~LOC~~ SOPN: 2.0000 mg | PEN_INJECTOR | SUBCUTANEOUS | 4 refills | Status: DC

## 2022-12-16 NOTE — Telephone Encounter (Signed)
Copied from [place] [phone]. Topic: Medicare AWV >> Dec 16, 2022  2:59 PM Christine Booth wrote: Reason for CRM: Called patient to schedule the Medicare Annual Wellness Visit (AWV)  and patient states that she can not do a Monday or Tuesday appointment with the Nurse Health Advisor. I advised patient I would let provider know and maybe it could be completed by provider at her next office visit.  Last AWV 11/26/22

## 2022-12-16 NOTE — Telephone Encounter (Signed)
Will perform at 12/26/22 office visit.

## 2022-12-17 ENCOUNTER — Telehealth: Payer: Self-pay

## 2022-12-18 MED ORDER — SEMAGLUTIDE (2 MG/DOSE) 8 MG/3ML ~~LOC~~ SOPN: 2.0000 mg | PEN_INJECTOR | SUBCUTANEOUS | 4 refills | Status: DC

## 2022-12-18 NOTE — Progress Notes (Cosign Needed)
Spoke with representative at [employer] and her application was not received, had to re fax the application. Re faxed application 01/26/74 via SRFax at 2:30 pm to [employer]. I will call 12/1722 to check status.  Christine Booth

## 2022-12-18 NOTE — Addendum Note (Signed)
Addended by: Marnee Guarneri T on: 12/18/2022 12:17 PM   Modules accepted: Orders

## 2022-12-19 DIAGNOSIS — E119 Type 2 diabetes mellitus without complications: Secondary | ICD-10-CM | POA: Diagnosis not present

## 2022-12-19 LAB — HM DIABETES EYE EXAM

## 2022-12-26 ENCOUNTER — Encounter: Payer: Self-pay | Admitting: Nurse Practitioner

## 2022-12-26 ENCOUNTER — Ambulatory Visit (INDEPENDENT_AMBULATORY_CARE_PROVIDER_SITE_OTHER): Payer: Medicare Other | Admitting: Nurse Practitioner

## 2022-12-26 VITALS — BP 113/75 | HR 76 | Temp 98.7°F | Ht 60.98 in | Wt 156.4 lb

## 2022-12-26 DIAGNOSIS — K219 Gastro-esophageal reflux disease without esophagitis: Secondary | ICD-10-CM | POA: Diagnosis not present

## 2022-12-26 DIAGNOSIS — Z7985 Long-term (current) use of injectable non-insulin antidiabetic drugs: Secondary | ICD-10-CM | POA: Diagnosis not present

## 2022-12-26 DIAGNOSIS — E1165 Type 2 diabetes mellitus with hyperglycemia: Secondary | ICD-10-CM | POA: Diagnosis not present

## 2022-12-26 DIAGNOSIS — E1169 Type 2 diabetes mellitus with other specified complication: Secondary | ICD-10-CM

## 2022-12-26 DIAGNOSIS — E876 Hypokalemia: Secondary | ICD-10-CM

## 2022-12-26 DIAGNOSIS — Z7984 Long term (current) use of oral hypoglycemic drugs: Secondary | ICD-10-CM | POA: Diagnosis not present

## 2022-12-26 DIAGNOSIS — E1159 Type 2 diabetes mellitus with other circulatory complications: Secondary | ICD-10-CM

## 2022-12-26 DIAGNOSIS — E785 Hyperlipidemia, unspecified: Secondary | ICD-10-CM | POA: Diagnosis not present

## 2022-12-26 DIAGNOSIS — Z6830 Body mass index (BMI) 30.0-30.9, adult: Secondary | ICD-10-CM

## 2022-12-26 DIAGNOSIS — Z0000 Encounter for general adult medical examination without abnormal findings: Secondary | ICD-10-CM

## 2022-12-26 DIAGNOSIS — I152 Hypertension secondary to endocrine disorders: Secondary | ICD-10-CM

## 2022-12-26 DIAGNOSIS — R011 Cardiac murmur, unspecified: Secondary | ICD-10-CM | POA: Diagnosis not present

## 2022-12-26 DIAGNOSIS — E6609 Other obesity due to excess calories: Secondary | ICD-10-CM

## 2022-12-26 LAB — MICROALBUMIN, URINE WAIVED
Creatinine, Urine Waived: 50 mg/dL (ref 10–300)
Microalb, Ur Waived: 30 mg/L — ABNORMAL HIGH (ref 0–19)

## 2022-12-26 LAB — BAYER DCA HB A1C WAIVED: HB A1C (BAYER DCA - WAIVED): 7.9 % — ABNORMAL HIGH (ref 4.8–5.6)

## 2022-12-26 NOTE — Assessment & Plan Note (Signed)
Chronic, ongoing with A1c 7.9% (upward trend due to diet over holidays and steroids) and urine ALB 30 in January 2024 -- continue ARB for kidney protection.   - At this time continue Ozempic and Jardiance, she is at max dose of each -- she prefers not to add any other medications at this time and to focus on walking routine and diet changes which she fell off of during holidays, this is appropriate.  However, discussed if ongoing elevations will need to add or consider changes. - If ongoing elevation A1c could consider sulfonylurea at very low dose, discussed with patient, due to lower cost and wishes to avoid insulin.   - Avoid Metformin as did not tolerate in past.   - Recommend continue to check BS daily and return to regular exercise and diet focus to maintain A1C <7.   - Eye and foot exams up to date.  Pneumococcal and flu vaccines up to date. Return in 3 months.

## 2022-12-26 NOTE — Assessment & Plan Note (Signed)
No symptoms, present for years.  Will consider echo if symptoms present -- discussed with patient.

## 2022-12-26 NOTE — Patient Instructions (Addendum)
Please call to schedule your mammogram and/or bone density: [facility]  Address: [address], [place], [place] [address] Phone: [phone]  [facility] [place] [address] [place],  [place]  [address] Phone: [phone]    Diabetes Mellitus Basics  Diabetes mellitus, or diabetes, is a long-term (chronic) disease. It occurs when the body does not properly use sugar (glucose) that is released from food after you eat. Diabetes mellitus may be caused by one or both of these problems: Your pancreas does not make enough of a hormone called insulin. Your body does not react in a normal way to the insulin that it makes. Insulin lets glucose enter cells in your body. This gives you energy. If you have diabetes, glucose cannot get into cells. This causes high blood glucose (hyperglycemia). How to treat and manage diabetes You may need to take insulin or other diabetes medicines daily to keep your glucose in balance. If you are prescribed insulin, you will learn how to give yourself insulin by injection. You may need to adjust the amount of insulin you take based on the foods that you eat. You will need to check your blood glucose levels using a glucose monitor as told by your health care provider. The readings can help determine if you have low or high blood glucose. Generally, you should have these blood glucose levels: Before meals (preprandial): 80-130 mg/dL (4.4-7.2 mmol/L). After meals (postprandial): below 180 mg/dL (10 mmol/L). Hemoglobin A1c (HbA1c) level: less than 7%. Your health care provider will set treatment goals for you. Keep all follow-up visits. This is important. Follow these instructions at home: Diabetes medicines Take your diabetes medicines every day as told by your health care provider. List your diabetes medicines here: Name of medicine: ______________________________ Amount (dose):  _______________ Time (a.m./p.m.): _______________ Notes: ___________________________________ Name of medicine: ______________________________ Amount (dose): _______________ Time (a.m./p.m.): _______________ Notes: ___________________________________ Name of medicine: ______________________________ Amount (dose): _______________ Time (a.m./p.m.): _______________ Notes: ___________________________________ Insulin If you use insulin, list the types of insulin you use here: Insulin type: ______________________________ Amount (dose): _______________ Time (a.m./p.m.): _______________Notes: ___________________________________ Insulin type: ______________________________ Amount (dose): _______________ Time (a.m./p.m.): _______________ Notes: ___________________________________ Insulin type: ______________________________ Amount (dose): _______________ Time (a.m./p.m.): _______________ Notes: ___________________________________ Insulin type: ______________________________ Amount (dose): _______________ Time (a.m./p.m.): _______________ Notes: ___________________________________ Insulin type: ______________________________ Amount (dose): _______________ Time (a.m./p.m.): _______________ Notes: ___________________________________ Managing blood glucose  Check your blood glucose levels using a glucose monitor as told by your health care provider. Write down the times that you check your glucose levels here: Time: _______________ Notes: ___________________________________ Time: _______________ Notes: ___________________________________ Time: _______________ Notes: ___________________________________ Time: _______________ Notes: ___________________________________ Time: _______________ Notes: ___________________________________ Time: _______________ Notes: ___________________________________  Low blood glucose Low blood glucose (hypoglycemia) is when glucose is at or below 70 mg/dL (3.9 mmol/L).  Symptoms may include: Feeling: Hungry. Sweaty and clammy. Irritable or easily upset. Dizzy. Sleepy. Having: A fast heartbeat. A headache. A change in your vision. Numbness around the mouth, lips, or tongue. Having trouble with: Moving (coordination). Sleeping. Treating low blood glucose To treat low blood glucose, eat or drink something containing sugar right away. If you can think clearly and swallow safely, follow the 15:15 rule: Take 15 grams of a fast-acting carb (carbohydrate), as told by your health care provider. Some fast-acting carbs are: Glucose tablets: take 3-4 tablets. Hard candy: eat 3-5 pieces. Fruit juice: drink 4 oz (120 mL). Regular (not diet) soda: drink 4-6 oz (120-180 mL).   Honey or sugar: eat 1 Tbsp (15 mL). Check your blood glucose levels 15 minutes after you take the carb. If your glucose is still at or below 70 mg/dL (3.9 mmol/L), take 15 grams of a carb again. If your glucose does not go above 70 mg/dL (3.9 mmol/L) after 3 tries, get help right away. After your glucose goes back to normal, eat a meal or a snack within 1 hour. Treating very low blood glucose If your glucose is at or below 54 mg/dL (3 mmol/L), you have very low blood glucose (severe hypoglycemia). This is an emergency. Do not wait to see if the symptoms will go away. Get medical help right away. Call your local emergency services (911 in the [place] Do not drive yourself to the hospital. Questions to ask your health care provider Should I talk with a diabetes educator? What equipment will I need to care for myself at home? What diabetes medicines do I need? When should I take them? How often do I need to check my blood glucose levels? What number can I call if I have questions? When is my follow-up visit? Where can I find a support group for people with diabetes? Where to find more information American Diabetes Association: www.diabetes.org Association of Diabetes Care and Education  Specialists: www.diabeteseducator.org Contact a health care provider if: Your blood glucose is at or above 240 mg/dL (13.3 mmol/L) for 2 days in a row. You have been sick or have had a fever for 2 days or more, and you are not getting better. You have any of these problems for more than 6 hours: You cannot eat or drink. You feel nauseous. You vomit. You have diarrhea. Get help right away if: Your blood glucose is lower than 54 mg/dL (3 mmol/L). You get confused. You have trouble thinking clearly. You have trouble breathing. These symptoms may represent a serious problem that is an emergency. Do not wait to see if the symptoms will go away. Get medical help right away. Call your local emergency services (911 in the [place] Do not drive yourself to the hospital. Summary Diabetes mellitus is a chronic disease that occurs when the body does not properly use sugar (glucose) that is released from food after you eat. Take insulin and diabetes medicines as told. Check your blood glucose every day, as often as told. Keep all follow-up visits. This is important. This information is not intended to replace advice given to you by your health care provider. Make sure you discuss any questions you have with your health care provider. Document Revised: 03/27/2020 Document Reviewed: 03/27/2020 Elsevier Patient Education  2023 [employer]  

## 2022-12-26 NOTE — Assessment & Plan Note (Signed)
Chronic, stable.  BP remains well below goal on exam.  Continue Olmesartan and Amlodipine at current dosing and adjust as needed.  LABS: CBC, CMP, and TSH today.  Continue to monitor BP at home regularly and focus on DASH diet + regular exercise.  Return in 3 months.

## 2022-12-26 NOTE — Assessment & Plan Note (Signed)
BMI 29.57 is working on exercise regimen.  Recommended eating smaller high protein, low fat meals more frequently and exercising 30 mins a day 5 times a week with a goal of 10-15lb weight loss in the next 3 months. Patient voiced their understanding and motivation to adhere to these recommendations.

## 2022-12-26 NOTE — Assessment & Plan Note (Signed)
Chronic, stable.  Continue current medication regimen and adjust as needed.  Lipid panel today. 

## 2022-12-26 NOTE — Assessment & Plan Note (Signed)
Recheck level today, recent 3.4, stable and she is adding to diet.

## 2022-12-26 NOTE — Assessment & Plan Note (Signed)
Chronic, stable at this time.  Risks of PPI use were discussed with patient including bone loss, C. Diff diarrhea, pneumonia, infections, CKD, electrolyte abnormalities. Verbalizes understanding and chooses to continue the medication. Mag level annually.

## 2022-12-26 NOTE — Progress Notes (Signed)
BP 113/75   Pulse 76   Temp 98.7 F (37.1 C) (Oral)   Ht 5' 0.98" (1.549 m)   Wt 156 lb 6.4 oz (70.9 kg)   LMP  (LMP Unknown)   SpO2 99%   BMI 29.57 kg/m    Subjective:    Patient ID: Christine Booth, female    DOB: 1956-05-15, 67 y.o.   MRN: [id number]  HPI: Christine Booth is a 67 y.o. female presenting on 12/26/2022 for Medicare Wellness and annual physical + diabetes follow-up. Current medical complaints include:none  She currently lives with: self Menopausal Symptoms: no   DIABETES Continues Jardiance 25 MG daily and Ozempic 2 MG weekly, working with CCM team on cost of medications. A1c in October 7.4%.  Over holidays ate more sweets. Did take some Prednisone with illness recently.   Took Metformin initially, caused some severe constipation.    Last colonoscopy was in 2018, she was to repeat in one year but did not attend this. Reports fears about this, which PCP discussed at length with her importance of repeat screening to ensure no cancerous polyps present.  She reports wishes for no treatment if something were present.  Educated her on colon CA.  Hypoglycemic episodes:no Polydipsia/polyuria: no Visual disturbance: no Chest pain: no Paresthesias: no Glucose Monitoring: yes             Accucheck frequency: once every couple weeks             Fasting glucose: 160-170 range at home             Post prandial:             Evening:             Before meals: Taking Insulin?: no             Long acting insulin:             Short acting insulin: Blood Pressure Monitoring: daily Retinal Examination: Up To Date  Foot Exam: Up to Date Pneumovax: Up to Date Influenza: Up to Date Aspirin: yes    HYPERTENSION / HYPERLIPIDEMIA Taking Olmesartan 40 MG daily, Amlodipine 2.5 MG daily, and Rosuvastatin 40 MG daily.   Satisfied with current treatment? yes Duration of hypertension: chronic BP monitoring frequency: once every two weeks BP range: <130/80 on checks BP  medication side effects: no Duration of hyperlipidemia: chronic Cholesterol medication side effects: no Cholesterol supplements: none Medication compliance: good compliance Aspirin: yes Recent stressors: no Recurrent headaches: none Visual changes: no Palpitations: no Dyspnea: no Chest pain: no Lower extremity edema: no Dizzy/lightheaded: no    GERD Continues on daily Protonix daily.   GERD control status: stable  Satisfied with current treatment? yes Heartburn frequency: none Medication side effects: no  Medication compliance: stable Dysphagia: no Odynophagia:  no Hematemesis: no Blood in stool: no EGD: yes     12/26/2022    8:24 AM 09/19/2022    8:27 AM 06/13/2022    8:33 AM 03/07/2022    8:17 AM 12/23/2021    8:13 AM  Depression screen PHQ 2/9  Decreased Interest 0 0 0 0 0  Down, Depressed, Hopeless 0 0 0 0 0  PHQ - 2 Score 0 0 0 0 0  Altered sleeping 0 0 1 1 0  Tired, decreased energy 1 0 0 0 0  Change in appetite 0 0 0 0 0  Feeling bad or failure about yourself  0 0 0 0 0  Trouble concentrating 0 0 0 0 0  Moving slowly or fidgety/restless 0 0 0 0 0  Suicidal thoughts 0 0 0 0 0  PHQ-9 Score 1 0 1 1 0  Difficult doing work/chores Not difficult at all Not difficult at all Not difficult at all  Not difficult at all       12/23/2021    8:13 AM 06/13/2022    8:32 AM 09/19/2022    8:26 AM 12/26/2022    8:24 AM 12/26/2022    9:18 AM  Fall Risk  Falls in the past year? 0 0 0 1 1  Was there an injury with Fall? 0 0 0 1 0  Fall Risk Category Calculator 0 0 0 3 1  Fall Risk Category (Retired) Low Low Low    (RETIRED) Patient Fall Risk Level  Low fall risk Low fall risk    Patient at Risk for Falls Due to  No Fall Risks No Fall Risks History of fall(s) No Fall Risks  Fall risk Follow up Falls evaluation completed Falls evaluation completed Falls evaluation completed Falls evaluation completed Falls evaluation completed    Functional Status Survey: Is the patient deaf  or have difficulty hearing?: No Does the patient have difficulty seeing, even when wearing glasses/contacts?: No Does the patient have difficulty concentrating, remembering, or making decisions?: No Does the patient have difficulty walking or climbing stairs?: No Does the patient have difficulty dressing or bathing?: No Does the patient have difficulty doing errands alone such as visiting a doctor's office or shopping?: No   Past Medical History:  Past Medical History:  Diagnosis Date   Allergy    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   Chronic kidney disease stage 1   Diabetes mellitus without complication (HCC)    Gastrointestinal disorder    GERD (gastroesophageal reflux disease)    Heart murmur    Hyperlipidemia    Hypertension    Rosacea     Surgical History:  Past Surgical History:  Procedure Laterality Date   COLONOSCOPY WITH PROPOFOL N/A 02/06/2017   Procedure: COLONOSCOPY WITH PROPOFOL;  Surgeon: Jonathon Bellows, MD;  Location: [facility] ENDOSCOPY;  Service: Endoscopy;  Laterality: N/A;   TUBAL LIGATION  1985    Medications:  Current Outpatient Medications on File Prior to Visit  Medication Sig   amLODipine (NORVASC) 2.5 MG tablet TAKE 1 TABLET BY MOUTH EVERY DAY   empagliflozin (JARDIANCE) 25 MG TABS tablet Take 25 mg by mouth daily.   fexofenadine (ALLEGRA ALLERGY) 180 MG tablet Take 1 tablet (180 mg total) by mouth daily.   fluticasone (FLONASE) 50 MCG/ACT nasal spray SPRAY 2 SPRAYS INTO EACH NOSTRIL EVERY DAY   glucose blood test strip Use as instructed   methocarbamol (ROBAXIN) 500 MG tablet methocarbamol 500 mg tablet  TAKE 1 TABLET 3 TIMES A DAY BY ORAL ROUTE AS NEEDED.   olmesartan (BENICAR) 40 MG tablet TAKE 1 TABLET BY MOUTH EVERY DAY   pantoprazole (PROTONIX) 40 MG tablet TAKE 1 TABLET BY MOUTH EVERY DAY   potassium chloride (KLOR-CON M) 10 MEQ tablet Take 1 tablet (10 mEq total) by mouth daily.   rosuvastatin (CRESTOR) 40 MG tablet TAKE 1 TABLET  BY MOUTH EVERY DAY   Semaglutide, 2 MG/DOSE, 8 MG/3ML SOPN Inject 2 mg into the skin once a week.   sennosides-docusate sodium (SENOKOT-S) 8.6-50 MG tablet Take 1 tablet by mouth daily as needed for constipation.   triamcinolone (KENALOG) 0.025 % cream APPLY TO AFFECTED AREA TWICE  A DAY   No current facility-administered medications on file prior to visit.    Allergies:  No Known Allergies  Social History:  Social History   Socioeconomic History   Marital status: Divorced    Spouse name: Not on file   Number of children: Not on file   Years of education: Not on file   Highest education level: Not on file  Occupational History   Not on file  Tobacco Use   Smoking status: Never   Smokeless tobacco: Never  Vaping Use   Vaping Use: Never used  Substance and Sexual Activity   Alcohol use: No    Alcohol/week: 0.0 standard drinks of alcohol   Drug use: No   Sexual activity: Not Currently  Other Topics Concern   Not on file  Social History Narrative   Not on file   Social Determinants of Health   Financial Resource Strain: Low Risk  (12/26/2022)   Overall Financial Resource Strain (CARDIA)    Difficulty of Paying Living Expenses: Not hard at all  Food Insecurity: No Food Insecurity (12/26/2022)   Hunger Vital Sign    Worried About Running Out of Food in the Last Year: Never true    [place] in the Last Year: Never true  Transportation Needs: No Transportation Needs (12/26/2022)   PRAPARE - Hydrologist (Medical): No    Lack of Transportation (Non-Medical): No  Physical Activity: Insufficiently Active (12/26/2022)   Exercise Vital Sign    Days of Exercise per Week: 2 days    Minutes of Exercise per Session: 20 min  Stress: No Stress Concern Present (12/26/2022)   [place]    Feeling of Stress : Not at all  Social Connections: Moderately Isolated (12/26/2022)   Social  Connection and Isolation Panel [NHANES]    Frequency of Communication with Friends and Family: More than three times a week    Frequency of Social Gatherings with Friends and Family: More than three times a week    Attends Religious Services: 1 to 4 times per year    Active Member of [employer] or Organizations: No    Attends Archivist Meetings: Never    Marital Status: Divorced  Human resources officer Violence: Not At Risk (12/26/2022)   Humiliation, Afraid, Rape, and Kick questionnaire    Fear of Current or Ex-Partner: No    Emotionally Abused: No    Physically Abused: No    Sexually Abused: No   Social History   Tobacco Use  Smoking Status Never  Smokeless Tobacco Never   Social History   Substance and Sexual Activity  Alcohol Use No   Alcohol/week: 0.0 standard drinks of alcohol    Family History:  Family History  Problem Relation Age of Onset   Arthritis Mother    Cancer Mother        breast   Heart disease Mother    Cancer Father        bladder   Heart disease Father    Hypertension Father    Aneurysm Sister    Cancer Sister        breast    Past medical history, surgical history, medications, allergies, family history and social history reviewed with patient today and changes made to appropriate areas of the chart.   ROS All other ROS negative except what is listed above and in the HPI.      Objective:  BP 113/75   Pulse 76   Temp 98.7 F (37.1 C) (Oral)   Ht 5' 0.98" (1.549 m)   Wt 156 lb 6.4 oz (70.9 kg)   LMP  (LMP Unknown)   SpO2 99%   BMI 29.57 kg/m   Wt Readings from Last 3 Encounters:  12/26/22 156 lb 6.4 oz (70.9 kg)  09/19/22 158 lb 9.6 oz (71.9 kg)  06/13/22 158 lb 3.2 oz (71.8 kg)    Physical Exam Vitals and nursing note reviewed. Exam conducted with a chaperone present.  Constitutional:      General: She is awake. She is not in acute distress.    Appearance: She is well-developed and well-groomed. She is not ill-appearing or  toxic-appearing.  HENT:     Head: Normocephalic and atraumatic.     Right Ear: Hearing, tympanic membrane, ear canal and external ear normal. No drainage.     Left Ear: Hearing, tympanic membrane, ear canal and external ear normal. No drainage.     Nose: Nose normal.     Right Sinus: No maxillary sinus tenderness or frontal sinus tenderness.     Left Sinus: No maxillary sinus tenderness or frontal sinus tenderness.     Mouth/Throat:     Mouth: Mucous membranes are moist.     Pharynx: Oropharynx is clear. Uvula midline. No pharyngeal swelling, oropharyngeal exudate or posterior oropharyngeal erythema.  Eyes:     General: Lids are normal.        Right eye: No discharge.        Left eye: No discharge.     Extraocular Movements: Extraocular movements intact.     Conjunctiva/sclera: Conjunctivae normal.     Pupils: Pupils are equal, round, and reactive to light.     Visual Fields: Right eye visual fields normal and left eye visual fields normal.  Neck:     Thyroid: No thyromegaly.     Vascular: No carotid bruit.     Trachea: Trachea normal.  Cardiovascular:     Rate and Rhythm: Normal rate and regular rhythm.     Heart sounds: Murmur heard.     Systolic murmur is present with a grade of 2/6.     No gallop.  Pulmonary:     Effort: Pulmonary effort is normal. No accessory muscle usage or respiratory distress.     Breath sounds: Normal breath sounds.  Abdominal:     General: Bowel sounds are normal.     Palpations: Abdomen is soft. There is no hepatomegaly or splenomegaly.     Tenderness: There is no abdominal tenderness.  Musculoskeletal:        General: Normal range of motion.     Cervical back: Normal range of motion and neck supple.     Right lower leg: No edema.     Left lower leg: No edema.  Lymphadenopathy:     Head:     Right side of head: No submental, submandibular, tonsillar, preauricular or posterior auricular adenopathy.     Left side of head: No submental,  submandibular, tonsillar, preauricular or posterior auricular adenopathy.     Cervical: No cervical adenopathy.  Skin:    General: Skin is warm and dry.     Capillary Refill: Capillary refill takes less than 2 seconds.     Findings: No rash.  Neurological:     Mental Status: She is alert and oriented to person, place, and time.     Gait: Gait is intact.     Deep  Tendon Reflexes: Reflexes are normal and symmetric.     Reflex Scores:      Brachioradialis reflexes are 2+ on the right side and 2+ on the left side.      Patellar reflexes are 2+ on the right side and 2+ on the left side. Psychiatric:        Attention and Perception: Attention normal.        Mood and Affect: Mood normal.        Speech: Speech normal.        Behavior: Behavior normal. Behavior is cooperative.        Thought Content: Thought content normal.        Judgment: Judgment normal.       12/26/2022    9:20 AM 11/09/2020    1:05 PM  6CIT Screen  What Year? 0 points 0 points  What month? 0 points 0 points  What time? 0 points 0 points  Count back from 20 0 points 0 points  Months in reverse 0 points 0 points  Repeat phrase 0 points 2 points  Total Score 0 points 2 points   Results for orders placed or performed in visit on 12/26/22  Bayer DCA Hb A1c Waived  Result Value Ref Range   HB A1C (BAYER DCA - WAIVED) 7.9 (H) 4.8 - 5.6 %  Microalbumin, Urine Waived  Result Value Ref Range   Microalb, Ur Waived 30 (H) 0 - 19 mg/L   Creatinine, Urine Waived 50 10 - 300 mg/dL   Microalb/Creat Ratio 30-300 (H) <30 mg/g      Assessment & Plan:   Problem List Items Addressed This Visit       Cardiovascular and Mediastinum   Hypertension associated with diabetes (HCC)    Chronic, stable.  BP remains well below goal on exam.  Continue Olmesartan and Amlodipine at current dosing and adjust as needed.  LABS: CBC, CMP, and TSH today.  Continue to monitor BP at home regularly and focus on DASH diet + regular exercise.   Return in 3 months.      Relevant Orders   Bayer DCA Hb A1c Waived (Completed)   Microalbumin, Urine Waived (Completed)   CBC with Differential/Platelet   TSH     Digestive   GERD (gastroesophageal reflux disease)    Chronic, stable at this time.  Risks of PPI use were discussed with patient including bone loss, C. Diff diarrhea, pneumonia, infections, CKD, electrolyte abnormalities. Verbalizes understanding and chooses to continue the medication. Mag level annually.         Endocrine   Hyperlipidemia associated with type 2 diabetes mellitus (HCC)    Chronic, stable. Continue current medication regimen and adjust as needed.  Lipid panel today.      Relevant Orders   Bayer DCA Hb A1c Waived (Completed)   Comprehensive metabolic panel   Lipid Panel w/o Chol/HDL Ratio   Type 2 diabetes mellitus with hyperglycemia (HCC)    Chronic, ongoing with A1c 7.9% (upward trend due to diet over holidays and steroids) and urine ALB 30 in January 2024 -- continue ARB for kidney protection.   - At this time continue Ozempic and Jardiance, she is at max dose of each -- she prefers not to add any other medications at this time and to focus on walking routine and diet changes which she fell off of during holidays, this is appropriate.  However, discussed if ongoing elevations will need to add or consider changes. - If ongoing  elevation A1c could consider sulfonylurea at very low dose, discussed with patient, due to lower cost and wishes to avoid insulin.   - Avoid Metformin as did not tolerate in past.   - Recommend continue to check BS daily and return to regular exercise and diet focus to maintain A1C <7.   - Eye and foot exams up to date.  Pneumococcal and flu vaccines up to date. Return in 3 months.      Relevant Orders   Bayer DCA Hb A1c Waived (Completed)   Microalbumin, Urine Waived (Completed)     Other   Hypokalemia    Recheck level today, recent 3.4, stable and she is adding to diet.       Relevant Orders   Comprehensive metabolic panel   Obesity    BMI 29.57 is working on exercise regimen.  Recommended eating smaller high protein, low fat meals more frequently and exercising 30 mins a day 5 times a week with a goal of 10-15lb weight loss in the next 3 months. Patient voiced their understanding and motivation to adhere to these recommendations.       Systolic murmur    No symptoms, present for years.  Will consider echo if symptoms present -- discussed with patient.      Other Visit Diagnoses     Medicare annual wellness visit, subsequent    -  Primary   Medicare wellness due and performed today with patient.   Encounter for annual physical exam       Annual physical today with labs and health maintenance reviewed, discussed with patient.        Follow up plan: Return in about 3 months (around 03/27/2023) for T2DM, HTN/HLD, GERD.   LABORATORY TESTING:  - Pap smear: not applicable  IMMUNIZATIONS:   - Tdap: Tetanus vaccination status reviewed: last tetanus booster within 10 years. - Influenza: Up to date - Pneumovax: Up To Date - Prevnar: Up to date - COVID: Up to date - HPV: Not applicable - Shingrix vaccine: Up to date  SCREENING: -Mammogram: Ordered Today - Colonoscopy: Refused  - Bone Density: Up to date  -Hearing Test: Not applicable  -Spirometry: Not applicable   PATIENT COUNSELING:   Advised to take 1 mg of folate supplement per day if capable of pregnancy.   Sexuality: Discussed sexually transmitted diseases, partner selection, use of condoms, avoidance of unintended pregnancy  and contraceptive alternatives.   Advised to avoid cigarette smoking.  I discussed with the patient that most people either abstain from alcohol or drink within safe limits (<=14/week and <=4 drinks/occasion for males, <=7/weeks and <= 3 drinks/occasion for females) and that the risk for alcohol disorders and other health effects rises proportionally with the number  of drinks per week and how often a drinker exceeds daily limits.  Discussed cessation/primary prevention of drug use and availability of treatment for abuse.   Diet: Encouraged to adjust caloric intake to maintain  or achieve ideal body weight, to reduce intake of dietary saturated fat and total fat, to limit sodium intake by avoiding high sodium foods and not adding table salt, and to maintain adequate dietary potassium and calcium preferably from fresh fruits, vegetables, and low-fat dairy products.    Stressed the importance of regular exercise  Injury prevention: Discussed safety belts, safety helmets, smoke detector, smoking near bedding or upholstery.   Dental health: Discussed importance of regular tooth brushing, flossing, and dental visits.    NEXT PREVENTATIVE PHYSICAL DUE IN 1 YEAR.  Return in about 3 months (around 03/27/2023) for T2DM, HTN/HLD, GERD.

## 2022-12-27 LAB — CBC WITH DIFFERENTIAL/PLATELET
Basophils Absolute: 0.1 10*3/uL (ref 0.0–0.2)
Basos: 1 %
EOS (ABSOLUTE): 0.1 10*3/uL (ref 0.0–0.4)
Eos: 2 %
Hematocrit: 41.5 % (ref 34.0–46.6)
Hemoglobin: 14.3 g/dL (ref 11.1–15.9)
Immature Grans (Abs): 0 10*3/uL (ref 0.0–0.1)
Immature Granulocytes: 0 %
Lymphocytes Absolute: 2.6 10*3/uL (ref 0.7–3.1)
Lymphs: 41 %
MCH: 31.4 pg (ref 26.6–33.0)
MCHC: 34.5 g/dL (ref 31.5–35.7)
MCV: 91 fL (ref 79–97)
Monocytes Absolute: 0.4 10*3/uL (ref 0.1–0.9)
Monocytes: 7 %
Neutrophils Absolute: 3.1 10*3/uL (ref 1.4–7.0)
Neutrophils: 49 %
Platelets: 239 10*3/uL (ref 150–450)
RBC: 4.56 x10E6/uL (ref 3.77–5.28)
RDW: 13.5 % (ref 11.7–15.4)
WBC: 6.3 10*3/uL (ref 3.4–10.8)

## 2022-12-27 LAB — COMPREHENSIVE METABOLIC PANEL
ALT: 15 IU/L (ref 0–32)
AST: 19 IU/L (ref 0–40)
Albumin/Globulin Ratio: 2 (ref 1.2–2.2)
Albumin: 4.5 g/dL (ref 3.9–4.9)
Alkaline Phosphatase: 64 IU/L (ref 44–121)
BUN/Creatinine Ratio: 16 (ref 12–28)
BUN: 9 mg/dL (ref 8–27)
Bilirubin Total: 0.7 mg/dL (ref 0.0–1.2)
CO2: 19 mmol/L — ABNORMAL LOW (ref 20–29)
Calcium: 9.3 mg/dL (ref 8.7–10.3)
Chloride: 107 mmol/L — ABNORMAL HIGH (ref 96–106)
Creatinine, Ser: 0.57 mg/dL (ref 0.57–1.00)
Globulin, Total: 2.2 g/dL (ref 1.5–4.5)
Glucose: 160 mg/dL — ABNORMAL HIGH (ref 70–99)
Potassium: 3.7 mmol/L (ref 3.5–5.2)
Sodium: 142 mmol/L (ref 134–144)
Total Protein: 6.7 g/dL (ref 6.0–8.5)
eGFR: 99 mL/min/{1.73_m2} (ref >59)

## 2022-12-27 LAB — LIPID PANEL W/O CHOL/HDL RATIO
Cholesterol, Total: 114 mg/dL (ref 100–199)
HDL: 57 mg/dL (ref >39)
LDL Chol Calc (NIH): 40 mg/dL (ref 0–99)
Triglycerides: 87 mg/dL (ref 0–149)
VLDL Cholesterol Cal: 17 mg/dL (ref 5–40)

## 2022-12-27 LAB — TSH: TSH: 1.72 u[IU]/mL (ref 0.450–4.500)

## 2022-12-27 NOTE — Progress Notes (Signed)
Contacted via [place] evening Christine Booth, your labs have returned: - Kidney function, creatinine and eGFR, remains normal, as is liver function, AST and ALT. Potassium level now in normal range again:) - Remainder if labs all normal.  Great news!!  Any questions? Keep being amazing!!  Thank you for allowing me to participate in your care.  I appreciate you. Kindest regards, Valentine Barney

## 2022-12-31 ENCOUNTER — Telehealth: Payer: Self-pay

## 2022-12-31 NOTE — Telephone Encounter (Signed)
Page 5 of [employer] Patient Garment/textile technologist have been faxed back with corrections.

## 2023-01-05 ENCOUNTER — Telehealth: Payer: Self-pay

## 2023-01-05 NOTE — Progress Notes (Cosign Needed)
[place]  to follow up on patient assistance application for Ozempic. Per representative at [employer] patient has been approved starting 01/01/2023 through 12/08/2023. Shipment may take up to 10-14 business days for delivery.  Ethelene Hal

## 2023-01-12 ENCOUNTER — Other Ambulatory Visit: Payer: Self-pay | Admitting: Nurse Practitioner

## 2023-01-12 DIAGNOSIS — E1159 Type 2 diabetes mellitus with other circulatory complications: Secondary | ICD-10-CM

## 2023-01-12 DIAGNOSIS — E1165 Type 2 diabetes mellitus with hyperglycemia: Secondary | ICD-10-CM

## 2023-01-13 NOTE — Telephone Encounter (Signed)
Requested Prescriptions  Pending Prescriptions Disp Refills   olmesartan (BENICAR) 40 MG tablet [Pharmacy Med Name: OLMESARTAN MEDOXOMIL 40 MG TAB] 90 tablet 0    Sig: TAKE 1 TABLET BY MOUTH EVERY DAY     Cardiovascular:  Angiotensin Receptor Blockers Passed - 01/12/2023  1:28 AM      Passed - Cr in normal range and within 180 days    Creatinine, Ser  Date Value Ref Range Status  12/26/2022 0.57 0.57 - 1.00 mg/dL Final         Passed - K in normal range and within 180 days    Potassium  Date Value Ref Range Status  12/26/2022 3.7 3.5 - 5.2 mmol/L Final         Passed - Patient is not pregnant      Passed - Last BP in normal range    BP Readings from Last 1 Encounters:  12/26/22 113/75         Passed - Valid encounter within last 6 months    Recent Outpatient Visits           2 weeks ago Medicare annual wellness visit, subsequent   [place], [place] T, NP   2 months ago [place] P, DO   3 months ago Type 2 diabetes mellitus with hyperglycemia, without long-term current use of insulin ([place])   [place], [place] T, NP   7 months ago Type 2 diabetes mellitus with hyperglycemia, without long-term current use of insulin ([place])   [place], [place] T, NP   10 months ago Type 2 diabetes mellitus with hyperglycemia, without long-term current use of insulin ([place])   [place], Barbaraann Faster, NP       Future Appointments             In 2 months [place], Barbaraann Faster, NP [place], [facility]

## 2023-01-17 ENCOUNTER — Other Ambulatory Visit: Payer: Self-pay | Admitting: Nurse Practitioner

## 2023-01-19 NOTE — Telephone Encounter (Signed)
Requested Prescriptions  Pending Prescriptions Disp Refills   fluticasone (FLONASE) 50 MCG/ACT nasal spray [Pharmacy Med Name: FLUTICASONE PROP 50 MCG SPRAY] 48 mL 3    Sig: SPRAY 2 SPRAYS INTO EACH NOSTRIL EVERY DAY     Ear, Nose, and Throat: Nasal Preparations - Corticosteroids Passed - 01/17/2023  1:04 AM      Passed - Valid encounter within last 12 months    Recent Outpatient Visits           3 weeks ago Medicare annual wellness visit, subsequent   [place], [place] T, NP   2 months ago [place] P, DO   4 months ago Type 2 diabetes mellitus with hyperglycemia, without long-term current use of insulin ([place])   [place], [place] T, NP   7 months ago Type 2 diabetes mellitus with hyperglycemia, without long-term current use of insulin ([place])   [place], [place] T, NP   10 months ago Type 2 diabetes mellitus with hyperglycemia, without long-term current use of insulin ([place])   [place], Barbaraann Faster, NP       Future Appointments             In 2 months Cannady, Barbaraann Faster, NP [place], [facility]

## 2023-02-26 ENCOUNTER — Encounter: Payer: Self-pay | Admitting: Nurse Practitioner

## 2023-03-02 ENCOUNTER — Telehealth: Payer: Self-pay

## 2023-03-02 NOTE — Telephone Encounter (Signed)
Called patient and gave her number for Ozempic refills  She states was denied for [place] but she makes $31,000/year which is below the required amount. Will have Olivia Mackie look into this

## 2023-03-02 NOTE — Progress Notes (Signed)
Care Management & Coordination Services Pharmacy Team  Reason for Encounter: Patient assistance enrollment  Enrolled patient in [facility] patient assistance program for the medication Farxiga. Spoke with representative for [place] who stated that the income for eligibility for a household of 1 should be less than $45,000.Application was prefilled 03/02/23 and will be printed and mailed out to patient for completion. Patient will return application to the [place] to be signed and faxed.  Unable to reach patient, left message to return call.  Ethelene Hal

## 2023-03-11 DIAGNOSIS — S0500XA Injury of conjunctiva and corneal abrasion without foreign body, unspecified eye, initial encounter: Secondary | ICD-10-CM | POA: Diagnosis not present

## 2023-03-21 NOTE — Patient Instructions (Signed)
Be Involved in Your Health Care:  Taking Medications When medications are taken as directed, they can greatly improve your health. But if they are not taken as instructed, they may not work. In some cases, not taking them correctly can be harmful. To help ensure your treatment remains effective and safe, understand your medications and how to take them.  Your lab results, notes and after visit summary will be available on My Chart. We strongly encourage you to use this feature. If lab results are abnormal the clinic will contact you with the appropriate steps. If the clinic does not contact you assume the results are satisfactory. You can always see your results on My Chart. If you have questions regarding your condition, please contact the clinic during office hours. You can also ask questions on My Chart.  We at [facility] are grateful that you chose us to provide care. We strive to provide excellent and compassionate care and are always looking for feedback. If you get a survey from the clinic please complete this.   Diabetes Mellitus Basics  Diabetes mellitus, or diabetes, is a long-term (chronic) disease. It occurs when the body does not properly use sugar (glucose) that is released from food after you eat. Diabetes mellitus may be caused by one or both of these problems: Your pancreas does not make enough of a hormone called insulin. Your body does not react in a normal way to the insulin that it makes. Insulin lets glucose enter cells in your body. This gives you energy. If you have diabetes, glucose cannot get into cells. This causes high blood glucose (hyperglycemia). How to treat and manage diabetes You may need to take insulin or other diabetes medicines daily to keep your glucose in balance. If you are prescribed insulin, you will learn how to give yourself insulin by injection. You may need to adjust the amount of insulin you take based on the foods that you eat. You will  need to check your blood glucose levels using a glucose monitor as told by your health care provider. The readings can help determine if you have low or high blood glucose. Generally, you should have these blood glucose levels: Before meals (preprandial): 80-130 mg/dL (4.4-7.2 mmol/L). After meals (postprandial): below 180 mg/dL (10 mmol/L). Hemoglobin A1c (HbA1c) level: less than 7%. Your health care provider will set treatment goals for you. Keep all follow-up visits. This is important. Follow these instructions at home: Diabetes medicines Take your diabetes medicines every day as told by your health care provider. List your diabetes medicines here: Name of medicine: ______________________________ Amount (dose): _______________ Time (a.m./p.m.): _______________ Notes: ___________________________________ Name of medicine: ______________________________ Amount (dose): _______________ Time (a.m./p.m.): _______________ Notes: ___________________________________ Name of medicine: ______________________________ Amount (dose): _______________ Time (a.m./p.m.): _______________ Notes: ___________________________________ Insulin If you use insulin, list the types of insulin you use here: Insulin type: ______________________________ Amount (dose): _______________ Time (a.m./p.m.): _______________Notes: ___________________________________ Insulin type: ______________________________ Amount (dose): _______________ Time (a.m./p.m.): _______________ Notes: ___________________________________ Insulin type: ______________________________ Amount (dose): _______________ Time (a.m./p.m.): _______________ Notes: ___________________________________ Insulin type: ______________________________ Amount (dose): _______________ Time (a.m./p.m.): _______________ Notes: ___________________________________ Insulin type: ______________________________ Amount (dose): _______________ Time (a.m./p.m.): _______________  Notes: ___________________________________ Managing blood glucose  Check your blood glucose levels using a glucose monitor as told by your health care provider. Write down the times that you check your glucose levels here: Time: _______________ Notes: ___________________________________ Time: _______________ Notes: ___________________________________ Time: _______________ Notes: ___________________________________ Time: _______________ Notes: ___________________________________ Time: _______________ Notes: ___________________________________ Time: _______________ Notes: ___________________________________    Low blood glucose Low blood glucose (hypoglycemia) is when glucose is at or below 70 mg/dL (3.9 mmol/L). Symptoms may include: Feeling: Hungry. Sweaty and clammy. Irritable or easily upset. Dizzy. Sleepy. Having: A fast heartbeat. A headache. A change in your vision. Numbness around the mouth, lips, or tongue. Having trouble with: Moving (coordination). Sleeping. Treating low blood glucose To treat low blood glucose, eat or drink something containing sugar right away. If you can think clearly and swallow safely, follow the 15:15 rule: Take 15 grams of a fast-acting carb (carbohydrate), as told by your health care provider. Some fast-acting carbs are: Glucose tablets: take 3-4 tablets. Hard candy: eat 3-5 pieces. Fruit juice: drink 4 oz (120 mL). Regular (not diet) soda: drink 4-6 oz (120-180 mL). Honey or sugar: eat 1 Tbsp (15 mL). Check your blood glucose levels 15 minutes after you take the carb. If your glucose is still at or below 70 mg/dL (3.9 mmol/L), take 15 grams of a carb again. If your glucose does not go above 70 mg/dL (3.9 mmol/L) after 3 tries, get help right away. After your glucose goes back to normal, eat a meal or a snack within 1 hour. Treating very low blood glucose If your glucose is at or below 54 mg/dL (3 mmol/L), you have very low blood glucose  (severe hypoglycemia). This is an emergency. Do not wait to see if the symptoms will go away. Get medical help right away. Call your local emergency services (911 in the [place] Do not drive yourself to the hospital. Questions to ask your health care provider Should I talk with a diabetes educator? What equipment will I need to care for myself at home? What diabetes medicines do I need? When should I take them? How often do I need to check my blood glucose levels? What number can I call if I have questions? When is my follow-up visit? Where can I find a support group for people with diabetes? Where to find more information American Diabetes Association: www.diabetes.org Association of Diabetes Care and Education Specialists: www.diabeteseducator.org Contact a health care provider if: Your blood glucose is at or above 240 mg/dL (13.3 mmol/L) for 2 days in a row. You have been sick or have had a fever for 2 days or more, and you are not getting better. You have any of these problems for more than 6 hours: You cannot eat or drink. You feel nauseous. You vomit. You have diarrhea. Get help right away if: Your blood glucose is lower than 54 mg/dL (3 mmol/L). You get confused. You have trouble thinking clearly. You have trouble breathing. These symptoms may represent a serious problem that is an emergency. Do not wait to see if the symptoms will go away. Get medical help right away. Call your local emergency services (911 in the [place] Do not drive yourself to the hospital. Summary Diabetes mellitus is a chronic disease that occurs when the body does not properly use sugar (glucose) that is released from food after you eat. Take insulin and diabetes medicines as told. Check your blood glucose every day, as often as told. Keep all follow-up visits. This is important. This information is not intended to replace advice given to you by your health care provider. Make sure you discuss any  questions you have with your health care provider. Document Revised: 03/27/2020 Document Reviewed: 03/27/2020 Elsevier Patient Education  2023 Elsevier Inc.  

## 2023-03-27 ENCOUNTER — Encounter: Payer: Self-pay | Admitting: Nurse Practitioner

## 2023-03-27 ENCOUNTER — Ambulatory Visit (INDEPENDENT_AMBULATORY_CARE_PROVIDER_SITE_OTHER): Payer: Medicare Other | Admitting: Nurse Practitioner

## 2023-03-27 VITALS — BP 131/77 | HR 77 | Temp 97.9°F | Ht 60.98 in | Wt 159.7 lb

## 2023-03-27 DIAGNOSIS — E1159 Type 2 diabetes mellitus with other circulatory complications: Secondary | ICD-10-CM | POA: Diagnosis not present

## 2023-03-27 DIAGNOSIS — E876 Hypokalemia: Secondary | ICD-10-CM

## 2023-03-27 DIAGNOSIS — E1165 Type 2 diabetes mellitus with hyperglycemia: Secondary | ICD-10-CM

## 2023-03-27 DIAGNOSIS — E1169 Type 2 diabetes mellitus with other specified complication: Secondary | ICD-10-CM

## 2023-03-27 DIAGNOSIS — E785 Hyperlipidemia, unspecified: Secondary | ICD-10-CM

## 2023-03-27 DIAGNOSIS — E6609 Other obesity due to excess calories: Secondary | ICD-10-CM

## 2023-03-27 DIAGNOSIS — K219 Gastro-esophageal reflux disease without esophagitis: Secondary | ICD-10-CM | POA: Diagnosis not present

## 2023-03-27 DIAGNOSIS — R011 Cardiac murmur, unspecified: Secondary | ICD-10-CM

## 2023-03-27 DIAGNOSIS — I152 Hypertension secondary to endocrine disorders: Secondary | ICD-10-CM

## 2023-03-27 DIAGNOSIS — Z6830 Body mass index (BMI) 30.0-30.9, adult: Secondary | ICD-10-CM

## 2023-03-27 LAB — BAYER DCA HB A1C WAIVED: HB A1C (BAYER DCA - WAIVED): 8.2 % — ABNORMAL HIGH (ref 4.8–5.6)

## 2023-03-27 MED ORDER — GLIPIZIDE 5 MG PO TABS: 5.0000 mg | ORAL_TABLET | Freq: Every day | ORAL | 1 refills | Status: DC

## 2023-03-27 MED ORDER — SEMAGLUTIDE (2 MG/DOSE) 8 MG/3ML ~~LOC~~ SOPN: 2.0000 mg | PEN_INJECTOR | SUBCUTANEOUS | 0 refills | Status: DC

## 2023-03-27 NOTE — Assessment & Plan Note (Signed)
Chronic, ongoing with A1c 8.2% (upward trend due to diet and medication not present (SGLT2)) and urine ALB 30 in January 2024 -- continue ARB for kidney protection.   - At this time continue Ozempic, she is at max dose, gets via assistance.  Will reach out to [facility] PharmD on SGLT2 paperwork as currently is out of Jardiance and can not get via assistance this year -- they are going to work on [place]. - Due to upward trend discussed with her and will start low dose Glipizide 5 MG every morning, which is cost effective and may help lower levels.  Educated her on hypoglycemia risk with this. - Avoid Metformin as did not tolerate in past.   - Recommend continue to check BS daily and return to regular exercise and diet focus to maintain A1C <7.   - Eye and foot exams up to date.  Pneumococcal and flu vaccines up to date. Return in 4 weeks.

## 2023-03-27 NOTE — Assessment & Plan Note (Signed)
Chronic, stable at this time.  Risks of PPI use were discussed with patient including bone loss, C. Diff diarrhea, pneumonia, infections, CKD, electrolyte abnormalities. Verbalizes understanding and chooses to continue the medication. Mag level annually.  

## 2023-03-27 NOTE — Assessment & Plan Note (Signed)
No symptoms, present for years.  Will consider echo if symptoms present -- discussed with patient. 

## 2023-03-27 NOTE — Progress Notes (Signed)
BP 131/77   Pulse 77   Temp 97.9 F (36.6 C) (Oral)   Ht 5' 0.98" (1.549 m)   Wt 159 lb 11.2 oz (72.4 kg)   LMP  (LMP Unknown)   SpO2 98%   BMI 30.19 kg/m    Subjective:    Patient ID: Christine Booth, female    DOB: 11-May-1956, 67 y.o.   MRN: [id number]  HPI: Christine Booth is a 67 y.o. female  Chief Complaint  Patient presents with   Diabetes   Hypertension   Hyperlipidemia   Gastroesophageal Reflux   DIABETES A1c in January 7.9%.  Continues Jardiance 25 MG daily and Ozempic 2 MG weekly, working with CCM team on cost of medications -- [place] was not approved this time, they are going to work on paperwork for [employer], but she has not returned.  Has not had Jardiance in 1 1/2 weeks.  Reports diet continues to be an ongoing issue.  Has returned to walking 3-4 days a week.   Took Metformin initially, caused some severe constipation.   Hypoglycemic episodes:no Polydipsia/polyuria: no Visual disturbance: no Chest pain: no Paresthesias: no Glucose Monitoring: yes             Accucheck frequency: not checking             Fasting glucose: this morning 250             Post prandial:             Evening:             Before meals: Taking Insulin?: no             Long acting insulin:             Short acting insulin: Blood Pressure Monitoring: daily Retinal Examination: Up To Date  Foot Exam: Up to Date Pneumovax: Up to Date Influenza: Up to Date Aspirin: yes    HYPERTENSION / HYPERLIPIDEMIA Taking Olmesartan 40 MG daily, Amlodipine 2.5 MG daily, and Rosuvastatin 40 MG daily.   Satisfied with current treatment? yes Duration of hypertension: chronic BP monitoring frequency: not checking BP range: BP medication side effects: no Duration of hyperlipidemia: chronic Cholesterol medication side effects: no Cholesterol supplements: none Medication compliance: good compliance Aspirin: yes Recent stressors: no Recurrent headaches: none Visual changes:  no Palpitations: no Dyspnea: no Chest pain: no Lower extremity edema: no Dizzy/lightheaded: no    GERD Continues on daily Protonix daily.  Last colonoscopy was in 2018, she was to repeat in one year but did not attend this. Reports fears about this, which PCP discussed at length with her importance of repeat screening to ensure no cancerous polyps present.  She reports wishes for no treatment if something were present.  Educated her on colon CA.  GERD control status: stable  Satisfied with current treatment? yes Heartburn frequency: none Medication side effects: no  Medication compliance: stable Dysphagia: no Odynophagia:  no Hematemesis: no Blood in stool: no EGD: yes   Relevant past medical, surgical, family and social history reviewed and updated as indicated. Interim medical history since our last visit reviewed. Allergies and medications reviewed and updated.  Review of Systems  Constitutional:  Negative for activity change, appetite change, diaphoresis, fatigue and fever.  Respiratory:  Negative for cough, chest tightness and shortness of breath.   Cardiovascular:  Negative for chest pain, palpitations and leg swelling.  Gastrointestinal: Negative.   Endocrine: Negative for polydipsia, polyphagia and polyuria.  Neurological:  Negative.   Psychiatric/Behavioral: Negative.      Per HPI unless specifically indicated above     Objective:    BP 131/77   Pulse 77   Temp 97.9 F (36.6 C) (Oral)   Ht 5' 0.98" (1.549 m)   Wt 159 lb 11.2 oz (72.4 kg)   LMP  (LMP Unknown)   SpO2 98%   BMI 30.19 kg/m   Wt Readings from Last 3 Encounters:  03/27/23 159 lb 11.2 oz (72.4 kg)  12/26/22 156 lb 6.4 oz (70.9 kg)  09/19/22 158 lb 9.6 oz (71.9 kg)    Physical Exam Vitals and nursing note reviewed.  Constitutional:      General: She is awake. She is not in acute distress.    Appearance: She is well-developed, well-groomed and overweight. She is not ill-appearing.  HENT:      Head: Normocephalic.     Right Ear: Hearing, ear canal and external ear normal. No drainage.     Left Ear: Hearing, ear canal and external ear normal. No drainage.     Ears:     Comments:      Mouth/Throat:     Mouth: Mucous membranes are moist.  Eyes:     General: Lids are normal.        Right eye: No discharge.        Left eye: No discharge.     Conjunctiva/sclera: Conjunctivae normal.     Pupils: Pupils are equal, round, and reactive to light.  Neck:     Thyroid: No thyromegaly.     Vascular: No carotid bruit.  Cardiovascular:     Rate and Rhythm: Normal rate and regular rhythm.     Heart sounds: Murmur heard.     Systolic murmur is present with a grade of 2/6.     No gallop.  Pulmonary:     Effort: Pulmonary effort is normal. No accessory muscle usage or respiratory distress.     Breath sounds: Normal breath sounds.  Abdominal:     General: Bowel sounds are normal.     Palpations: Abdomen is soft.  Musculoskeletal:     Cervical back: Normal range of motion and neck supple.     Right lower leg: No edema.     Left lower leg: No edema.  Skin:    General: Skin is warm and dry.  Neurological:     Mental Status: She is alert and oriented to person, place, and time.  Psychiatric:        Attention and Perception: Attention normal.        Mood and Affect: Mood normal.        Behavior: Behavior normal. Behavior is cooperative.        Thought Content: Thought content normal.        Judgment: Judgment normal.    Diabetic Foot Exam - Simple   Simple Foot Form Visual Inspection No deformities, no ulcerations, no other skin breakdown bilaterally: Yes Sensation Testing Intact to touch and monofilament testing bilaterally: Yes Pulse Check Posterior Tibialis and Dorsalis pulse intact bilaterally: Yes Comments     Results for orders placed or performed in visit on 12/26/22  Bayer DCA Hb A1c Waived  Result Value Ref Range   HB A1C (BAYER DCA - WAIVED) 7.9 (H) 4.8 - 5.6 %   Microalbumin, Urine Waived  Result Value Ref Range   Microalb, Ur Waived 30 (H) 0 - 19 mg/L   Creatinine, Urine Waived 50 10 - 300  mg/dL   Microalb/Creat Ratio 30-300 (H) <30 mg/g  CBC with Differential/Platelet  Result Value Ref Range   WBC 6.3 3.4 - 10.8 x10E3/uL   RBC 4.56 3.77 - 5.28 x10E6/uL   Hemoglobin 14.3 11.1 - 15.9 g/dL   Hematocrit 78.2 95.6 - 46.6 %   MCV 91 79 - 97 fL   MCH 31.4 26.6 - 33.0 pg   MCHC 34.5 31.5 - 35.7 g/dL   RDW 21.3 08.6 - 57.8 %   Platelets 239 150 - 450 x10E3/uL   Neutrophils 49 Not Estab. %   Lymphs 41 Not Estab. %   Monocytes 7 Not Estab. %   Eos 2 Not Estab. %   Basos 1 Not Estab. %   Neutrophils Absolute 3.1 1.4 - 7.0 x10E3/uL   Lymphocytes Absolute 2.6 0.7 - 3.1 x10E3/uL   Monocytes Absolute 0.4 0.1 - 0.9 x10E3/uL   EOS (ABSOLUTE) 0.1 0.0 - 0.4 x10E3/uL   Basophils Absolute 0.1 0.0 - 0.2 x10E3/uL   Immature Granulocytes 0 Not Estab. %   Immature Grans (Abs) 0.0 0.0 - 0.1 x10E3/uL  Comprehensive metabolic panel  Result Value Ref Range   Glucose 160 (H) 70 - 99 mg/dL   BUN 9 8 - 27 mg/dL   Creatinine, Ser 4.69 0.57 - 1.00 mg/dL   eGFR 99 >62 XB/MWU/1.32   BUN/Creatinine Ratio 16 12 - 28   Sodium 142 134 - 144 mmol/L   Potassium 3.7 3.5 - 5.2 mmol/L   Chloride 107 (H) 96 - 106 mmol/L   CO2 19 (L) 20 - 29 mmol/L   Calcium 9.3 8.7 - 10.3 mg/dL   Total Protein 6.7 6.0 - 8.5 g/dL   Albumin 4.5 3.9 - 4.9 g/dL   Globulin, Total 2.2 1.5 - 4.5 g/dL   Albumin/Globulin Ratio 2.0 1.2 - 2.2   Bilirubin Total 0.7 0.0 - 1.2 mg/dL   Alkaline Phosphatase 64 44 - 121 IU/L   AST 19 0 - 40 IU/L   ALT 15 0 - 32 IU/L  Lipid Panel w/o Chol/HDL Ratio  Result Value Ref Range   Cholesterol, Total 114 100 - 199 mg/dL   Triglycerides 87 0 - 149 mg/dL   HDL 57 >44 mg/dL   VLDL Cholesterol Cal 17 5 - 40 mg/dL   LDL Chol Calc (NIH) 40 0 - 99 mg/dL  TSH  Result Value Ref Range   TSH 1.720 0.450 - 4.500 uIU/mL      Assessment & Plan:   Problem List  Items Addressed This Visit       Cardiovascular and Mediastinum   Hypertension associated with diabetes    Chronic, stable.  BP remains well below goal on exam.  Continue Olmesartan and Amlodipine at current dosing and adjust as needed.  LABS: BMP.  Continue to monitor BP at home regularly and focus on DASH diet + regular exercise.        Relevant Medications   Semaglutide, 2 MG/DOSE, 8 MG/3ML SOPN   glipiZIDE (GLUCOTROL) 5 MG tablet   Other Relevant Orders   Bayer DCA Hb A1c Waived   Basic metabolic panel     Digestive   GERD (gastroesophageal reflux disease)    Chronic, stable at this time.  Risks of PPI use were discussed with patient including bone loss, C. Diff diarrhea, pneumonia, infections, CKD, electrolyte abnormalities. Verbalizes understanding and chooses to continue the medication. Mag level annually.         Endocrine   Hyperlipidemia associated with type 2  diabetes mellitus    Chronic, stable. Continue current medication regimen and adjust as needed.  Lipid panel today.      Relevant Medications   Semaglutide, 2 MG/DOSE, 8 MG/3ML SOPN   glipiZIDE (GLUCOTROL) 5 MG tablet   Other Relevant Orders   Bayer DCA Hb A1c Waived   Lipid Panel w/o Chol/HDL Ratio   Type 2 diabetes mellitus with hyperglycemia - Primary    Chronic, ongoing with A1c 8.2% (upward trend due to diet and medication not present (SGLT2)) and urine ALB 30 in January 2024 -- continue ARB for kidney protection.   - At this time continue Ozempic, she is at max dose, gets via assistance.  Will reach out to [facility] PharmD on SGLT2 paperwork as currently is out of Jardiance and can not get via assistance this year -- they are going to work on [place]. - Due to upward trend discussed with her and will start low dose Glipizide 5 MG every morning, which is cost effective and may help lower levels.  Educated her on hypoglycemia risk with this. - Avoid Metformin as did not tolerate in past.   - Recommend continue to  check BS daily and return to regular exercise and diet focus to maintain A1C <7.   - Eye and foot exams up to date.  Pneumococcal and flu vaccines up to date. Return in 4 weeks.      Relevant Medications   Semaglutide, 2 MG/DOSE, 8 MG/3ML SOPN   glipiZIDE (GLUCOTROL) 5 MG tablet   Other Relevant Orders   Bayer DCA Hb A1c Waived     Other   Hypokalemia    Recheck level today, recent 3.7, stable and she is adding to diet.      Relevant Orders   Basic metabolic panel   Obesity    BMI 30.19 is working on exercise regimen.  Recommended eating smaller high protein, low fat meals more frequently and exercising 30 mins a day 5 times a week with a goal of 10-15lb weight loss in the next 3 months. Patient voiced their understanding and motivation to adhere to these recommendations.       Relevant Medications   Semaglutide, 2 MG/DOSE, 8 MG/3ML SOPN   glipiZIDE (GLUCOTROL) 5 MG tablet   Systolic murmur    No symptoms, present for years.  Will consider echo if symptoms present -- discussed with patient.      Relevant Orders   Basic metabolic panel     Follow up plan: Return in about 4 weeks (around 04/24/2023) for T2DM.

## 2023-03-27 NOTE — Assessment & Plan Note (Signed)
Chronic, stable.  Continue current medication regimen and adjust as needed.  Lipid panel today. 

## 2023-03-27 NOTE — Assessment & Plan Note (Addendum)
Chronic, stable.  BP remains well below goal on exam.  Continue Olmesartan and Amlodipine at current dosing and adjust as needed.  LABS: BMP.  Continue to monitor BP at home regularly and focus on DASH diet + regular exercise.

## 2023-03-27 NOTE — Assessment & Plan Note (Signed)
Recheck level today, recent 3.7, stable and she is adding to diet.

## 2023-03-27 NOTE — Assessment & Plan Note (Signed)
BMI 30.19 is working on exercise regimen.  Recommended eating smaller high protein, low fat meals more frequently and exercising 30 mins a day 5 times a week with a goal of 10-15lb weight loss in the next 3 months. Patient voiced their understanding and motivation to adhere to these recommendations.

## 2023-03-28 LAB — BASIC METABOLIC PANEL
BUN/Creatinine Ratio: 10 — ABNORMAL LOW (ref 12–28)
BUN: 6 mg/dL — ABNORMAL LOW (ref 8–27)
CO2: 24 mmol/L (ref 20–29)
Calcium: 9.4 mg/dL (ref 8.7–10.3)
Chloride: 106 mmol/L (ref 96–106)
Creatinine, Ser: 0.61 mg/dL (ref 0.57–1.00)
Glucose: 197 mg/dL — ABNORMAL HIGH (ref 70–99)
Potassium: 3.9 mmol/L (ref 3.5–5.2)
Sodium: 142 mmol/L (ref 134–144)
eGFR: 97 mL/min/{1.73_m2} (ref >59)

## 2023-03-28 LAB — LIPID PANEL W/O CHOL/HDL RATIO
Cholesterol, Total: 102 mg/dL (ref 100–199)
HDL: 61 mg/dL (ref >39)
LDL Chol Calc (NIH): 28 mg/dL (ref 0–99)
Triglycerides: 58 mg/dL (ref 0–149)
VLDL Cholesterol Cal: 13 mg/dL (ref 5–40)

## 2023-03-29 NOTE — Progress Notes (Signed)
Contacted via MyChart   Good morning Christine Booth, your labs have returned: - Kidney function, creatinine and eGFR, remains normal. - Cholesterol labs remain well at goal, continue Rosuvastatin as is offering benefit.  Any questions? Keep being amazing!!  Thank you for allowing me to participate in your care.  I appreciate you. Kindest regards, Matalie Romberger

## 2023-04-01 ENCOUNTER — Telehealth: Payer: Self-pay

## 2023-04-01 NOTE — Progress Notes (Signed)
Care Management & Coordination Services Pharmacy Team  Reason for Encounter: Patient assistance renewal   [place] for the medication Farxiga. Prefilled application and mailed to patient address in chart.  Spoke with patient and instructions were given. Patient to return application to [facility] when completed.  Velvet Bathe

## 2023-04-03 ENCOUNTER — Telehealth: Payer: Self-pay | Admitting: Nurse Practitioner

## 2023-04-03 NOTE — Telephone Encounter (Signed)
Forms have been signed and placed in Pharmacy folder for pick up

## 2023-04-03 NOTE — Telephone Encounter (Signed)
Paperwork was placed in providers folder for completion and signature.

## 2023-04-03 NOTE — Telephone Encounter (Signed)
Sent message via teams to [facility] Patient Assistance stating paperwork is ready for pick up.

## 2023-04-03 NOTE — Telephone Encounter (Signed)
Pt came by and dropped off a Medication assistance form ([place] [employer]) to be completed by the provider.  Placed in providers folder in the back for completion.

## 2023-04-05 ENCOUNTER — Other Ambulatory Visit: Payer: Self-pay | Admitting: Nurse Practitioner

## 2023-04-05 DIAGNOSIS — E1159 Type 2 diabetes mellitus with other circulatory complications: Secondary | ICD-10-CM

## 2023-04-05 DIAGNOSIS — E1165 Type 2 diabetes mellitus with hyperglycemia: Secondary | ICD-10-CM

## 2023-04-07 NOTE — Telephone Encounter (Signed)
Requested Prescriptions  Pending Prescriptions Disp Refills   olmesartan (BENICAR) 40 MG tablet [Pharmacy Med Name: OLMESARTAN MEDOXOMIL 40 MG TAB] 90 tablet 0    Sig: TAKE 1 TABLET BY MOUTH EVERY DAY     Cardiovascular:  Angiotensin Receptor Blockers Passed - 04/05/2023  8:30 AM      Passed - Cr in normal range and within 180 days    Creatinine, Ser  Date Value Ref Range Status  03/27/2023 0.61 0.57 - 1.00 mg/dL Final         Passed - K in normal range and within 180 days    Potassium  Date Value Ref Range Status  03/27/2023 3.9 3.5 - 5.2 mmol/L Final         Passed - Patient is not pregnant      Passed - Last BP in normal range    BP Readings from Last 1 Encounters:  03/27/23 131/77         Passed - Valid encounter within last 6 months    Recent Outpatient Visits           1 week ago Type 2 diabetes mellitus with hyperglycemia, without long-term current use of insulin (HCC)   [place] [facility] [place], Corrie Dandy T, NP   3 months ago Medicare annual wellness visit, subsequent   [place] [facility] [place], [place] T, NP   4 months ago COVID   [place] [facility] [place], Megan P, DO   6 months ago Type 2 diabetes mellitus with hyperglycemia, without long-term current use of insulin (HCC)   [place] [facility] [place], [place] T, NP   9 months ago Type 2 diabetes mellitus with hyperglycemia, without long-term current use of insulin (HCC)   [place] [facility] [place], Dorie Rank, NP       Future Appointments             In 2 weeks [place], Dorie Rank, NP [facility]

## 2023-04-08 ENCOUNTER — Telehealth: Payer: Self-pay

## 2023-04-08 NOTE — Progress Notes (Signed)
Care Management & Coordination Services Pharmacy Team  Reason for Encounter: Patient assistance renewal   [place] [employer] [place] for the medication Farxiga. Patient has been approved, start date 04/07/23 until date: 12/31/824. Prescription sent to pharmacy still pending can take 10-14 business days before delivery.  Velvet Bathe

## 2023-04-14 ENCOUNTER — Telehealth: Payer: Self-pay | Admitting: Nurse Practitioner

## 2023-04-14 NOTE — Telephone Encounter (Signed)
[employer] Patient Assistance Program came in the mail to be filled out by provider.  Placed in provider's folder. 

## 2023-04-15 NOTE — Telephone Encounter (Signed)
Paperwork has been faxed by to [employer]

## 2023-04-16 ENCOUNTER — Telehealth: Payer: Self-pay

## 2023-04-16 NOTE — Telephone Encounter (Signed)
Medication picked up by the patient.  

## 2023-04-16 NOTE — Telephone Encounter (Signed)
4 boxes of Ozempic received for the patient. Called and notified her that it was ready to be picked up.  

## 2023-04-19 NOTE — Patient Instructions (Signed)
Diabetes Mellitus Basics  Diabetes mellitus, or diabetes, is a long-term (chronic) disease. It occurs when the body does not properly use sugar (glucose) that is released from food after you eat. Diabetes mellitus may be caused by one or both of these problems: Your pancreas does not make enough of a hormone called insulin. Your body does not react in a normal way to the insulin that it makes. Insulin lets glucose enter cells in your body. This gives you energy. If you have diabetes, glucose cannot get into cells. This causes high blood glucose (hyperglycemia). How to treat and manage diabetes You may need to take insulin or other diabetes medicines daily to keep your glucose in balance. If you are prescribed insulin, you will learn how to give yourself insulin by injection. You may need to adjust the amount of insulin you take based on the foods that you eat. You will need to check your blood glucose levels using a glucose monitor as told by your health care provider. The readings can help determine if you have low or high blood glucose. Generally, you should have these blood glucose levels: Before meals (preprandial): 80-130 mg/dL (4.4-7.2 mmol/L). After meals (postprandial): below 180 mg/dL (10 mmol/L). Hemoglobin A1c (HbA1c) level: less than 7%. Your health care provider will set treatment goals for you. Keep all follow-up visits. This is important. Follow these instructions at home: Diabetes medicines Take your diabetes medicines every day as told by your health care provider. List your diabetes medicines here: Name of medicine: ______________________________ Amount (dose): _______________ Time (a.m./p.m.): _______________ Notes: ___________________________________ Name of medicine: ______________________________ Amount (dose): _______________ Time (a.m./p.m.): _______________ Notes: ___________________________________ Name of medicine: ______________________________ Amount (dose):  _______________ Time (a.m./p.m.): _______________ Notes: ___________________________________ Insulin If you use insulin, list the types of insulin you use here: Insulin type: ______________________________ Amount (dose): _______________ Time (a.m./p.m.): _______________Notes: ___________________________________ Insulin type: ______________________________ Amount (dose): _______________ Time (a.m./p.m.): _______________ Notes: ___________________________________ Insulin type: ______________________________ Amount (dose): _______________ Time (a.m./p.m.): _______________ Notes: ___________________________________ Insulin type: ______________________________ Amount (dose): _______________ Time (a.m./p.m.): _______________ Notes: ___________________________________ Insulin type: ______________________________ Amount (dose): _______________ Time (a.m./p.m.): _______________ Notes: ___________________________________ Managing blood glucose  Check your blood glucose levels using a glucose monitor as told by your health care provider. Write down the times that you check your glucose levels here: Time: _______________ Notes: ___________________________________ Time: _______________ Notes: ___________________________________ Time: _______________ Notes: ___________________________________ Time: _______________ Notes: ___________________________________ Time: _______________ Notes: ___________________________________ Time: _______________ Notes: ___________________________________  Low blood glucose Low blood glucose (hypoglycemia) is when glucose is at or below 70 mg/dL (3.9 mmol/L). Symptoms may include: Feeling: Hungry. Sweaty and clammy. Irritable or easily upset. Dizzy. Sleepy. Having: A fast heartbeat. A headache. A change in your vision. Numbness around the mouth, lips, or tongue. Having trouble with: Moving (coordination). Sleeping. Treating low blood glucose To treat low blood  glucose, eat or drink something containing sugar right away. If you can think clearly and swallow safely, follow the 15:15 rule: Take 15 grams of a fast-acting carb (carbohydrate), as told by your health care provider. Some fast-acting carbs are: Glucose tablets: take 3-4 tablets. Hard candy: eat 3-5 pieces. Fruit juice: drink 4 oz (120 mL). Regular (not diet) soda: drink 4-6 oz (120-180 mL). Honey or sugar: eat 1 Tbsp (15 mL). Check your blood glucose levels 15 minutes after you take the carb. If your glucose is still at or below 70 mg/dL (3.9 mmol/L), take 15 grams of a carb again. If your glucose does not go above 70 mg/dL (3.9 mmol/L) after   3 tries, get help right away. After your glucose goes back to normal, eat a meal or a snack within 1 hour. Treating very low blood glucose If your glucose is at or below 54 mg/dL (3 mmol/L), you have very low blood glucose (severe hypoglycemia). This is an emergency. Do not wait to see if the symptoms will go away. Get medical help right away. Call your local emergency services (911 in the [place] Do not drive yourself to the hospital. Questions to ask your health care provider Should I talk with a diabetes educator? What equipment will I need to care for myself at home? What diabetes medicines do I need? When should I take them? How often do I need to check my blood glucose levels? What number can I call if I have questions? When is my follow-up visit? Where can I find a support group for people with diabetes? Where to find more information American Diabetes Association: www.diabetes.org Association of Diabetes Care and Education Specialists: www.diabeteseducator.org Contact a health care provider if: Your blood glucose is at or above 240 mg/dL (13.3 mmol/L) for 2 days in a row. You have been sick or have had a fever for 2 days or more, and you are not getting better. You have any of these problems for more than 6 hours: You cannot eat or  drink. You feel nauseous. You vomit. You have diarrhea. Get help right away if: Your blood glucose is lower than 54 mg/dL (3 mmol/L). You get confused. You have trouble thinking clearly. You have trouble breathing. These symptoms may represent a serious problem that is an emergency. Do not wait to see if the symptoms will go away. Get medical help right away. Call your local emergency services (911 in the [place] Do not drive yourself to the hospital. Summary Diabetes mellitus is a chronic disease that occurs when the body does not properly use sugar (glucose) that is released from food after you eat. Take insulin and diabetes medicines as told. Check your blood glucose every day, as often as told. Keep all follow-up visits. This is important. This information is not intended to replace advice given to you by your health care provider. Make sure you discuss any questions you have with your health care provider. Document Revised: 03/27/2020 Document Reviewed: 03/27/2020 Elsevier Patient Education  2023 Elsevier Inc.  

## 2023-04-24 ENCOUNTER — Ambulatory Visit (INDEPENDENT_AMBULATORY_CARE_PROVIDER_SITE_OTHER): Payer: Medicare Other | Admitting: Nurse Practitioner

## 2023-04-24 ENCOUNTER — Encounter: Payer: Self-pay | Admitting: Nurse Practitioner

## 2023-04-24 VITALS — BP 121/76 | HR 68 | Temp 97.6°F | Ht 60.98 in | Wt 160.4 lb

## 2023-04-24 DIAGNOSIS — E1165 Type 2 diabetes mellitus with hyperglycemia: Secondary | ICD-10-CM | POA: Diagnosis not present

## 2023-04-24 NOTE — Assessment & Plan Note (Signed)
Chronic, ongoing with A1c 8.2% April 2024 and urine ALB 30 in January 2024 -- continue ARB for kidney protection.   - At this time continue Ozempic, she is at max dose, gets via assistance and Farxiga 10 MG daily (which she just received via assistance). - Continue low dose Glipizide 5 MG every morning, which is cost effective and may help lower levels.  Educated her on hypoglycemia risk with this.  She will monitor and alert provider if BS consistently <70. - Avoid Metformin as did not tolerate in past.   - Recommend continue to check BS daily and return to regular exercise and diet focus to maintain A1C <7.   - Eye and foot exams up to date.  Pneumococcal and flu vaccines up to date. Return in 2 months.

## 2023-04-24 NOTE — Progress Notes (Signed)
BP 121/76   Pulse 68   Temp 97.6 F (36.4 C) (Oral)   Ht 5' 0.98" (1.549 m)   Wt 160 lb 6.4 oz (72.8 kg)   LMP  (LMP Unknown)   SpO2 99%   BMI 30.32 kg/m    Subjective:    Patient ID: Agnes Hayslip, female    DOB: Feb 12, 1956, 67 y.o.   MRN: [id number]  HPI: Tatiana Tippens is a 67 y.o. female  Chief Complaint  Patient presents with   Diabetes    Charlies Silvers on Monday after she received in mail   DIABETES A1c in April was 8.2%.  Taking Farxiga 10 MG daily and Ozempic 2 MG weekly, working with CCM team on cost of medications.  We started a low dose of Glipizide at last visit, 5 MG daily - no ADR with this and is tolerating.    Last visit had been out of [place] for 1 1/2 weeks as no further assistance available for this, changed to [place] which she just received in mail on Monday.  Reports diet continues to be an ongoing issue.  Has returned to exercising, doing chair yoga.  Metformin in past caused severe constipation. Hypoglycemic episodes:no Polydipsia/polyuria: no Visual disturbance: no Chest pain: no Paresthesias: no Glucose Monitoring: yes  Accucheck frequency: Daily  Fasting glucose: 150 yesterday, trending down  Post prandial:  Evening:  Before meals: Taking Insulin?: no  Long acting insulin:  Short acting insulin: Blood Pressure Monitoring: rarely Retinal Examination: Up to Date Foot Exam: Up to Date Pneumovax: Up to Date Influenza: Up to Date Aspirin: no   Relevant past medical, surgical, family and social history reviewed and updated as indicated. Interim medical history since our last visit reviewed. Allergies and medications reviewed and updated.  Review of Systems  Constitutional:  Negative for activity change, appetite change, diaphoresis, fatigue and fever.  Respiratory:  Negative for cough, chest tightness and shortness of breath.   Cardiovascular:  Negative for chest pain, palpitations and leg swelling.  Gastrointestinal:  Negative.   Endocrine: Negative for polydipsia, polyphagia and polyuria.  Neurological: Negative.   Psychiatric/Behavioral: Negative.      Per HPI unless specifically indicated above     Objective:    BP 121/76   Pulse 68   Temp 97.6 F (36.4 C) (Oral)   Ht 5' 0.98" (1.549 m)   Wt 160 lb 6.4 oz (72.8 kg)   LMP  (LMP Unknown)   SpO2 99%   BMI 30.32 kg/m   Wt Readings from Last 3 Encounters:  04/24/23 160 lb 6.4 oz (72.8 kg)  03/27/23 159 lb 11.2 oz (72.4 kg)  12/26/22 156 lb 6.4 oz (70.9 kg)    Physical Exam Vitals and nursing note reviewed.  Constitutional:      General: She is awake. She is not in acute distress.    Appearance: She is well-developed, well-groomed and overweight. She is not ill-appearing.  HENT:     Head: Normocephalic.     Right Ear: Hearing, ear canal and external ear normal. No drainage.     Left Ear: Hearing, ear canal and external ear normal. No drainage.     Ears:     Comments:      Mouth/Throat:     Mouth: Mucous membranes are moist.  Eyes:     General: Lids are normal.        Right eye: No discharge.        Left eye: No discharge.  Conjunctiva/sclera: Conjunctivae normal.     Pupils: Pupils are equal, round, and reactive to light.  Neck:     Thyroid: No thyromegaly.     Vascular: No carotid bruit.  Cardiovascular:     Rate and Rhythm: Normal rate and regular rhythm.     Heart sounds: Murmur heard.     Systolic murmur is present with a grade of 2/6.     No gallop.  Pulmonary:     Effort: Pulmonary effort is normal. No accessory muscle usage or respiratory distress.     Breath sounds: Normal breath sounds.  Abdominal:     General: Bowel sounds are normal.     Palpations: Abdomen is soft.  Musculoskeletal:     Cervical back: Normal range of motion and neck supple.     Right lower leg: No edema.     Left lower leg: No edema.  Skin:    General: Skin is warm and dry.  Neurological:     Mental Status: She is alert and oriented to  person, place, and time.  Psychiatric:        Attention and Perception: Attention normal.        Mood and Affect: Mood normal.        Behavior: Behavior normal. Behavior is cooperative.        Thought Content: Thought content normal.        Judgment: Judgment normal.    Results for orders placed or performed in visit on 03/27/23  Bayer DCA Hb A1c Waived  Result Value Ref Range   HB A1C (BAYER DCA - WAIVED) 8.2 (H) 4.8 - 5.6 %  Basic metabolic panel  Result Value Ref Range   Glucose 197 (H) 70 - 99 mg/dL   BUN 6 (L) 8 - 27 mg/dL   Creatinine, Ser 1.61 0.57 - 1.00 mg/dL   eGFR 97 >09 UE/AVW/0.98   BUN/Creatinine Ratio 10 (L) 12 - 28   Sodium 142 134 - 144 mmol/L   Potassium 3.9 3.5 - 5.2 mmol/L   Chloride 106 96 - 106 mmol/L   CO2 24 20 - 29 mmol/L   Calcium 9.4 8.7 - 10.3 mg/dL  Lipid Panel w/o Chol/HDL Ratio  Result Value Ref Range   Cholesterol, Total 102 100 - 199 mg/dL   Triglycerides 58 0 - 149 mg/dL   HDL 61 >11 mg/dL   VLDL Cholesterol Cal 13 5 - 40 mg/dL   LDL Chol Calc (NIH) 28 0 - 99 mg/dL      Assessment & Plan:   Problem List Items Addressed This Visit       Endocrine   Type 2 diabetes mellitus with hyperglycemia (HCC) - Primary    Chronic, ongoing with A1c 8.2% April 2024 and urine ALB 30 in January 2024 -- continue ARB for kidney protection.   - At this time continue Ozempic, she is at max dose, gets via assistance and Farxiga 10 MG daily (which she just received via assistance). - Continue low dose Glipizide 5 MG every morning, which is cost effective and may help lower levels.  Educated her on hypoglycemia risk with this.  She will monitor and alert provider if BS consistently <70. - Avoid Metformin as did not tolerate in past.   - Recommend continue to check BS daily and return to regular exercise and diet focus to maintain A1C <7.   - Eye and foot exams up to date.  Pneumococcal and flu vaccines up to date. Return in 2  months.      Relevant  Medications   dapagliflozin propanediol (FARXIGA) 10 MG TABS tablet     Follow up plan: Return in about 2 months (around 06/24/2023) for T2DM, HTN/HLD.

## 2023-06-21 ENCOUNTER — Other Ambulatory Visit: Payer: Self-pay | Admitting: Nurse Practitioner

## 2023-06-21 NOTE — Patient Instructions (Signed)
Diabetes Mellitus Basics  Diabetes mellitus, or diabetes, is a long-term (chronic) disease. It occurs when the body does not properly use sugar (glucose) that is released from food after you eat. Diabetes mellitus may be caused by one or both of these problems: Your pancreas does not make enough of a hormone called insulin. Your body does not react in a normal way to the insulin that it makes. Insulin lets glucose enter cells in your body. This gives you energy. If you have diabetes, glucose cannot get into cells. This causes high blood glucose (hyperglycemia). How to treat and manage diabetes You may need to take insulin or other diabetes medicines daily to keep your glucose in balance. If you are prescribed insulin, you will learn how to give yourself insulin by injection. You may need to adjust the amount of insulin you take based on the foods that you eat. You will need to check your blood glucose levels using a glucose monitor as told by your health care provider. The readings can help determine if you have low or high blood glucose. Generally, you should have these blood glucose levels: Before meals (preprandial): 80-130 mg/dL (4.4-7.2 mmol/L). After meals (postprandial): below 180 mg/dL (10 mmol/L). Hemoglobin A1c (HbA1c) level: less than 7%. Your health care provider will set treatment goals for you. Keep all follow-up visits. This is important. Follow these instructions at home: Diabetes medicines Take your diabetes medicines every day as told by your health care provider. List your diabetes medicines here: Name of medicine: ______________________________ Amount (dose): _______________ Time (a.m./p.m.): _______________ Notes: ___________________________________ Name of medicine: ______________________________ Amount (dose): _______________ Time (a.m./p.m.): _______________ Notes: ___________________________________ Name of medicine: ______________________________ Amount (dose):  _______________ Time (a.m./p.m.): _______________ Notes: ___________________________________ Insulin If you use insulin, list the types of insulin you use here: Insulin type: ______________________________ Amount (dose): _______________ Time (a.m./p.m.): _______________Notes: ___________________________________ Insulin type: ______________________________ Amount (dose): _______________ Time (a.m./p.m.): _______________ Notes: ___________________________________ Insulin type: ______________________________ Amount (dose): _______________ Time (a.m./p.m.): _______________ Notes: ___________________________________ Insulin type: ______________________________ Amount (dose): _______________ Time (a.m./p.m.): _______________ Notes: ___________________________________ Insulin type: ______________________________ Amount (dose): _______________ Time (a.m./p.m.): _______________ Notes: ___________________________________ Managing blood glucose  Check your blood glucose levels using a glucose monitor as told by your health care provider. Write down the times that you check your glucose levels here: Time: _______________ Notes: ___________________________________ Time: _______________ Notes: ___________________________________ Time: _______________ Notes: ___________________________________ Time: _______________ Notes: ___________________________________ Time: _______________ Notes: ___________________________________ Time: _______________ Notes: ___________________________________  Low blood glucose Low blood glucose (hypoglycemia) is when glucose is at or below 70 mg/dL (3.9 mmol/L). Symptoms may include: Feeling: Hungry. Sweaty and clammy. Irritable or easily upset. Dizzy. Sleepy. Having: A fast heartbeat. A headache. A change in your vision. Numbness around the mouth, lips, or tongue. Having trouble with: Moving (coordination). Sleeping. Treating low blood glucose To treat low blood  glucose, eat or drink something containing sugar right away. If you can think clearly and swallow safely, follow the 15:15 rule: Take 15 grams of a fast-acting carb (carbohydrate), as told by your health care provider. Some fast-acting carbs are: Glucose tablets: take 3-4 tablets. Hard candy: eat 3-5 pieces. Fruit juice: drink 4 oz (120 mL). Regular (not diet) soda: drink 4-6 oz (120-180 mL). Honey or sugar: eat 1 Tbsp (15 mL). Check your blood glucose levels 15 minutes after you take the carb. If your glucose is still at or below 70 mg/dL (3.9 mmol/L), take 15 grams of a carb again. If your glucose does not go above 70 mg/dL (3.9 mmol/L) after   3 tries, get help right away. After your glucose goes back to normal, eat a meal or a snack within 1 hour. Treating very low blood glucose If your glucose is at or below 54 mg/dL (3 mmol/L), you have very low blood glucose (severe hypoglycemia). This is an emergency. Do not wait to see if the symptoms will go away. Get medical help right away. Call your local emergency services (911 in the [place] Do not drive yourself to the hospital. Questions to ask your health care provider Should I talk with a diabetes educator? What equipment will I need to care for myself at home? What diabetes medicines do I need? When should I take them? How often do I need to check my blood glucose levels? What number can I call if I have questions? When is my follow-up visit? Where can I find a support group for people with diabetes? Where to find more information American Diabetes Association: www.diabetes.org Association of Diabetes Care and Education Specialists: www.diabeteseducator.org Contact a health care provider if: Your blood glucose is at or above 240 mg/dL (13.3 mmol/L) for 2 days in a row. You have been sick or have had a fever for 2 days or more, and you are not getting better. You have any of these problems for more than 6 hours: You cannot eat or  drink. You feel nauseous. You vomit. You have diarrhea. Get help right away if: Your blood glucose is lower than 54 mg/dL (3 mmol/L). You get confused. You have trouble thinking clearly. You have trouble breathing. These symptoms may represent a serious problem that is an emergency. Do not wait to see if the symptoms will go away. Get medical help right away. Call your local emergency services (911 in the [place] Do not drive yourself to the hospital. Summary Diabetes mellitus is a chronic disease that occurs when the body does not properly use sugar (glucose) that is released from food after you eat. Take insulin and diabetes medicines as told. Check your blood glucose every day, as often as told. Keep all follow-up visits. This is important. This information is not intended to replace advice given to you by your health care provider. Make sure you discuss any questions you have with your health care provider. Document Revised: 03/27/2020 Document Reviewed: 03/27/2020 Elsevier Patient Education  2024 Elsevier Inc.  

## 2023-06-22 NOTE — Telephone Encounter (Signed)
Requested Prescriptions  Pending Prescriptions Disp Refills   pantoprazole (PROTONIX) 40 MG tablet [Pharmacy Med Name: PANTOPRAZOLE SOD DR 40 MG TAB] 90 tablet 3    Sig: TAKE 1 TABLET BY MOUTH EVERY DAY     Gastroenterology: Proton Pump Inhibitors Passed - 06/21/2023  1:25 AM      Passed - Valid encounter within last 12 months    Recent Outpatient Visits           1 month ago Type 2 diabetes mellitus with hyperglycemia, without long-term current use of insulin (HCC)   [place] [facility] [place], Metolius T, NP   2 months ago Type 2 diabetes mellitus with hyperglycemia, without long-term current use of insulin (HCC)   [place] [facility] [place], Corrie Dandy T, NP   5 months ago Medicare annual wellness visit, subsequent   [facility] [place], [place] T, NP   7 months ago COVID   [place] [facility], Megan P, DO   9 months ago Type 2 diabetes mellitus with hyperglycemia, without long-term current use of insulin (HCC)   [place] [facility] [place], Dorie Rank, NP       Future Appointments             In 4 days [place], Dorie Rank, NP [facility]

## 2023-06-26 ENCOUNTER — Ambulatory Visit (INDEPENDENT_AMBULATORY_CARE_PROVIDER_SITE_OTHER): Payer: Medicare Other | Admitting: Nurse Practitioner

## 2023-06-26 ENCOUNTER — Encounter: Payer: Self-pay | Admitting: Nurse Practitioner

## 2023-06-26 VITALS — BP 116/75 | HR 73 | Temp 97.9°F | Ht 60.98 in | Wt 161.4 lb

## 2023-06-26 DIAGNOSIS — E1165 Type 2 diabetes mellitus with hyperglycemia: Secondary | ICD-10-CM

## 2023-06-26 DIAGNOSIS — I152 Hypertension secondary to endocrine disorders: Secondary | ICD-10-CM | POA: Diagnosis not present

## 2023-06-26 DIAGNOSIS — E1169 Type 2 diabetes mellitus with other specified complication: Secondary | ICD-10-CM

## 2023-06-26 DIAGNOSIS — E876 Hypokalemia: Secondary | ICD-10-CM

## 2023-06-26 DIAGNOSIS — Z6830 Body mass index (BMI) 30.0-30.9, adult: Secondary | ICD-10-CM

## 2023-06-26 DIAGNOSIS — E1159 Type 2 diabetes mellitus with other circulatory complications: Secondary | ICD-10-CM | POA: Diagnosis not present

## 2023-06-26 DIAGNOSIS — E785 Hyperlipidemia, unspecified: Secondary | ICD-10-CM

## 2023-06-26 DIAGNOSIS — R011 Cardiac murmur, unspecified: Secondary | ICD-10-CM

## 2023-06-26 DIAGNOSIS — E6609 Other obesity due to excess calories: Secondary | ICD-10-CM

## 2023-06-26 LAB — BAYER DCA HB A1C WAIVED: HB A1C (BAYER DCA - WAIVED): 7.4 % — ABNORMAL HIGH (ref 4.8–5.6)

## 2023-06-26 MED ORDER — GLIPIZIDE 5 MG PO TABS: 7.5000 mg | ORAL_TABLET | Freq: Every day | ORAL | 4 refills | Status: DC

## 2023-06-26 NOTE — Assessment & Plan Note (Signed)
Chronic, ongoing with A1c 7.4% today and trending down with Glipizide on board and urine ALB 30 in January 2024 -- continue ARB for kidney protection.   - At this time continue Ozempic, she is at max dose, gets via assistance and Farxiga 10 MG daily (which she just receives via assistance). - Continue Glipizide but increase to 7.5 MG every morning, which is cost effective and may help lower levels.  Educated her on hypoglycemia risk with this.  She will monitor and alert provider if BS consistently <70.  Could consider Mounjaro in future and stopping Ozempic if assistance program available in future. - Avoid Metformin as did not tolerate in past.   - Recommend continue to check BS daily and return to regular exercise and diet focus to maintain A1C <7.   - Eye and foot exams up to date.  Pneumococcal and flu vaccines up to date. Return in 3 months.

## 2023-06-26 NOTE — Progress Notes (Signed)
BP 116/75   Pulse 73   Temp 97.9 F (36.6 C) (Oral)   Ht 5' 0.98" (1.549 m)   Wt 161 lb 6.4 oz (73.2 kg)   LMP  (LMP Unknown)   SpO2 97%   BMI 30.51 kg/m    Subjective:    Patient ID: Christine Booth, female    DOB: [birth year], 67 y.o.   MRN: [id number]  HPI: Christine Booth is a 67 y.o. female  Chief Complaint  Patient presents with   Diabetes   Hypertension   Hyperlipidemia   DIABETES A1c 8.2% April and we added Glipizide.  Continues Farxiga 10 MG daily (assistance), Ozempic 2 MG weekly (assistance), Glipizide 5 MG daily.   States she is working on diet changes and trying to [employer].  Is doing "old lady" exercise in home daily.   Took Metformin initially, caused some severe constipation.   Hypoglycemic episodes:no Polydipsia/polyuria: no Visual disturbance: no Chest pain: no Paresthesias: no Glucose Monitoring: yes             Accucheck frequency: not checking             Fasting glucose: 160-170             Post prandial:             Evening:             Before meals: Taking Insulin?: no             Long acting insulin:             Short acting insulin: Blood Pressure Monitoring: daily Retinal Examination: Up To Date  Foot Exam: Up to Date Pneumovax: Up to Date Influenza: Up to Date Aspirin: yes    HYPERTENSION / HYPERLIPIDEMIA Taking Olmesartan 40 MG daily, Amlodipine 2.5 MG daily, and Rosuvastatin 40 MG daily.   Satisfied with current treatment? yes Duration of hypertension: chronic BP monitoring frequency: not checking BP range: BP medication side effects: no Duration of hyperlipidemia: chronic Cholesterol medication side effects: no Cholesterol supplements: none Medication compliance: good compliance Aspirin: yes Recent stressors: no Recurrent headaches: none Visual changes: no Palpitations: no Dyspnea: no Chest pain: no Lower extremity edema: no Dizzy/lightheaded: no    Relevant past medical, surgical, family and social history  reviewed and updated as indicated. Interim medical history since our last visit reviewed. Allergies and medications reviewed and updated.  Review of Systems  Constitutional:  Negative for activity change, appetite change, diaphoresis, fatigue and fever.  Respiratory:  Negative for cough, chest tightness and shortness of breath.   Cardiovascular:  Negative for chest pain, palpitations and leg swelling.  Gastrointestinal: Negative.   Endocrine: Negative for polydipsia, polyphagia and polyuria.  Neurological: Negative.   Psychiatric/Behavioral: Negative.      Per HPI unless specifically indicated above     Objective:    BP 116/75   Pulse 73   Temp 97.9 F (36.6 C) (Oral)   Ht 5' 0.98" (1.549 m)   Wt 161 lb 6.4 oz (73.2 kg)   LMP  (LMP Unknown)   SpO2 97%   BMI 30.51 kg/m   Wt Readings from Last 3 Encounters:  06/26/23 161 lb 6.4 oz (73.2 kg)  04/24/23 160 lb 6.4 oz (72.8 kg)  03/27/23 159 lb 11.2 oz (72.4 kg)    Physical Exam Vitals and nursing note reviewed.  Constitutional:      General: She is awake. She is not in acute distress.  Appearance: She is well-developed, well-groomed and overweight. She is not ill-appearing.  HENT:     Head: Normocephalic.     Right Ear: Hearing, ear canal and external ear normal. No drainage.     Left Ear: Hearing, ear canal and external ear normal. No drainage.     Ears:     Comments:      Mouth/Throat:     Mouth: Mucous membranes are moist.  Eyes:     General: Lids are normal.        Right eye: No discharge.        Left eye: No discharge.     Conjunctiva/sclera: Conjunctivae normal.     Pupils: Pupils are equal, round, and reactive to light.  Neck:     Thyroid: No thyromegaly.     Vascular: No carotid bruit.  Cardiovascular:     Rate and Rhythm: Normal rate and regular rhythm.     Heart sounds: Murmur heard.     Systolic murmur is present with a grade of 2/6.     No gallop.  Pulmonary:     Effort: Pulmonary effort is  normal. No accessory muscle usage or respiratory distress.     Breath sounds: Normal breath sounds.  Abdominal:     General: Bowel sounds are normal.     Palpations: Abdomen is soft.  Musculoskeletal:     Cervical back: Normal range of motion and neck supple.     Right lower leg: No edema.     Left lower leg: No edema.  Skin:    General: Skin is warm and dry.  Neurological:     Mental Status: She is alert and oriented to person, place, and time.  Psychiatric:        Attention and Perception: Attention normal.        Mood and Affect: Mood normal.        Behavior: Behavior normal. Behavior is cooperative.        Thought Content: Thought content normal.        Judgment: Judgment normal.    Results for orders placed or performed in visit on 03/27/23  Bayer DCA Hb A1c Waived  Result Value Ref Range   HB A1C (BAYER DCA - WAIVED) 8.2 (H) 4.8 - 5.6 %  Basic metabolic panel  Result Value Ref Range   Glucose 197 (H) 70 - 99 mg/dL   BUN 6 (L) 8 - 27 mg/dL   Creatinine, Ser 0.35 0.57 - 1.00 mg/dL   eGFR 97 >00 XF/GHW/2.99   BUN/Creatinine Ratio 10 (L) 12 - 28   Sodium 142 134 - 144 mmol/L   Potassium 3.9 3.5 - 5.2 mmol/L   Chloride 106 96 - 106 mmol/L   CO2 24 20 - 29 mmol/L   Calcium 9.4 8.7 - 10.3 mg/dL  Lipid Panel w/o Chol/HDL Ratio  Result Value Ref Range   Cholesterol, Total 102 100 - 199 mg/dL   Triglycerides 58 0 - 149 mg/dL   HDL 61 >37 mg/dL   VLDL Cholesterol Cal 13 5 - 40 mg/dL   LDL Chol Calc (NIH) 28 0 - 99 mg/dL      Assessment & Plan:   Problem List Items Addressed This Visit       Cardiovascular and Mediastinum   Hypertension associated with diabetes (HCC)    Chronic, stable.  BP remains well below goal on exam.  Continue Olmesartan and Amlodipine at current dosing and adjust as needed.  LABS: CMP.  Continue  to monitor BP at home regularly and focus on DASH diet + regular exercise.        Relevant Medications   glipiZIDE (GLUCOTROL) 5 MG tablet   Other  Relevant Orders   Amb Referral to Clinical Pharmacist     Endocrine   Hyperlipidemia associated with type 2 diabetes mellitus (HCC)    Chronic, stable. Continue current medication regimen and adjust as needed.  Lipid panel today.      Relevant Medications   glipiZIDE (GLUCOTROL) 5 MG tablet   Other Relevant Orders   Amb Referral to Clinical Pharmacist   Type 2 diabetes mellitus with hyperglycemia (HCC) - Primary    Chronic, ongoing with A1c 7.4% today and trending down with Glipizide on board and urine ALB 30 in January 2024 -- continue ARB for kidney protection.   - At this time continue Ozempic, she is at max dose, gets via assistance and Farxiga 10 MG daily (which she just receives via assistance). - Continue Glipizide but increase to 7.5 MG every morning, which is cost effective and may help lower levels.  Educated her on hypoglycemia risk with this.  She will monitor and alert provider if BS consistently <70.  Could consider Mounjaro in future and stopping Ozempic if assistance program available in future. - Avoid Metformin as did not tolerate in past.   - Recommend continue to check BS daily and return to regular exercise and diet focus to maintain A1C <7.   - Eye and foot exams up to date.  Pneumococcal and flu vaccines up to date. Return in 3 months.      Relevant Medications   glipiZIDE (GLUCOTROL) 5 MG tablet   Other Relevant Orders   Amb Referral to Clinical Pharmacist     Other   Hypokalemia    Recheck level today, recent 3.9, stable and she is adding to diet.      Obesity    BMI 30.51 is working on exercise regimen.  Recommended eating smaller high protein, low fat meals more frequently and exercising 30 mins a day 5 times a week with a goal of 10-15lb weight loss in the next 3 months. Patient voiced their understanding and motivation to adhere to these recommendations.       Relevant Medications   glipiZIDE (GLUCOTROL) 5 MG tablet   Systolic murmur    No  symptoms, present for years.  Will consider echo if symptoms present -- discussed with patient.        Follow up plan: Return in about 3 months (around 09/26/2023) for T2DM, HTN/HLD.

## 2023-06-26 NOTE — Addendum Note (Signed)
Addended by: Leward Quan A on: 06/26/2023 11:41 AM   Modules accepted: Orders

## 2023-06-26 NOTE — Addendum Note (Signed)
Addended by: Leward Quan A on: 06/26/2023 12:25 PM   Modules accepted: Orders

## 2023-06-26 NOTE — Assessment & Plan Note (Signed)
Chronic, stable.  BP remains well below goal on exam.  Continue Olmesartan and Amlodipine at current dosing and adjust as needed.  LABS: CMP.  Continue to monitor BP at home regularly and focus on DASH diet + regular exercise.

## 2023-06-26 NOTE — Assessment & Plan Note (Signed)
Chronic, stable.  Continue current medication regimen and adjust as needed.  Lipid panel today. 

## 2023-06-26 NOTE — Assessment & Plan Note (Signed)
No symptoms, present for years.  Will consider echo if symptoms present -- discussed with patient. 

## 2023-06-26 NOTE — Assessment & Plan Note (Signed)
BMI 30.51 is working on exercise regimen.  Recommended eating smaller high protein, low fat meals more frequently and exercising 30 mins a day 5 times a week with a goal of 10-15lb weight loss in the next 3 months. Patient voiced their understanding and motivation to adhere to these recommendations.

## 2023-06-26 NOTE — Assessment & Plan Note (Signed)
Recheck level today, recent 3.9, stable and she is adding to diet.

## 2023-06-27 LAB — BASIC METABOLIC PANEL
BUN/Creatinine Ratio: 20 (ref 12–28)
BUN: 12 mg/dL (ref 8–27)
CO2: 20 mmol/L (ref 20–29)
Calcium: 9.4 mg/dL (ref 8.7–10.3)
Chloride: 105 mmol/L (ref 96–106)
Creatinine, Ser: 0.59 mg/dL (ref 0.57–1.00)
Glucose: 129 mg/dL — ABNORMAL HIGH (ref 70–99)
Potassium: 3.6 mmol/L (ref 3.5–5.2)
Sodium: 142 mmol/L (ref 134–144)
eGFR: 98 mL/min/{1.73_m2} (ref >59)

## 2023-06-27 LAB — LIPID PANEL
Chol/HDL Ratio: 1.8 ratio (ref 0.0–4.4)
Cholesterol, Total: 100 mg/dL (ref 100–199)
HDL: 55 mg/dL (ref >39)
LDL Chol Calc (NIH): 27 mg/dL (ref 0–99)
Triglycerides: 93 mg/dL (ref 0–149)
VLDL Cholesterol Cal: 18 mg/dL (ref 5–40)

## 2023-06-27 NOTE — Progress Notes (Signed)
Contacted via MyChart   Good morning Christine Booth, your labs have returned and overall remain stable.  - Kidney function, creatinine and eGFR, remains normal.  Cholesterol levels at goal.  Any questions? Keep being amazing!!  Thank you for allowing me to participate in your care.  I appreciate you. Kindest regards, Freedom Lopezperez

## 2023-07-03 ENCOUNTER — Other Ambulatory Visit: Payer: Self-pay | Admitting: Nurse Practitioner

## 2023-07-03 DIAGNOSIS — I152 Hypertension secondary to endocrine disorders: Secondary | ICD-10-CM

## 2023-07-03 DIAGNOSIS — E1165 Type 2 diabetes mellitus with hyperglycemia: Secondary | ICD-10-CM

## 2023-07-03 NOTE — Telephone Encounter (Signed)
Requested Prescriptions  Pending Prescriptions Disp Refills   olmesartan (BENICAR) 40 MG tablet [Pharmacy Med Name: OLMESARTAN MEDOXOMIL 40 MG TAB] 90 tablet 0    Sig: TAKE 1 TABLET BY MOUTH EVERY DAY     Cardiovascular:  Angiotensin Receptor Blockers Passed - 07/03/2023  2:27 AM      Passed - Cr in normal range and within 180 days    Creatinine, Ser  Date Value Ref Range Status  06/26/2023 0.59 0.57 - 1.00 mg/dL Final         Passed - K in normal range and within 180 days    Potassium  Date Value Ref Range Status  06/26/2023 3.6 3.5 - 5.2 mmol/L Final         Passed - Patient is not pregnant      Passed - Last BP in normal range    BP Readings from Last 1 Encounters:  06/26/23 116/75         Passed - Valid encounter within last 6 months    Recent Outpatient Visits           1 week ago Type 2 diabetes mellitus with hyperglycemia, without long-term current use of insulin (HCC)   [place] [facility] [place], [place] T, NP   2 months ago Type 2 diabetes mellitus with hyperglycemia, without long-term current use of insulin (HCC)   [facility] [place], [place] T, NP   3 months ago Type 2 diabetes mellitus with hyperglycemia, without long-term current use of insulin (HCC)   [place] [facility] [place], Corrie Dandy T, NP   6 months ago Medicare annual wellness visit, subsequent   [place] [facility] [place], [place] T, NP   7 months ago COVID   [place] [facility] [place], [place], DO       Future Appointments             In 3 months Cannady, Dorie Rank, NP [facility], [facility]

## 2023-07-06 ENCOUNTER — Other Ambulatory Visit: Payer: Self-pay | Admitting: Nurse Practitioner

## 2023-07-06 ENCOUNTER — Telehealth: Payer: Self-pay

## 2023-07-06 NOTE — Progress Notes (Signed)
   Care Guide Note  07/06/2023 Name: Christine Booth MRN: [id number] DOB: 1956/12/03  Referred by: Marjie Skiff, NP Reason for referral : Care Coordination (Outreach to schedule with Pharm d )   Christine Booth is a 67 y.o. year old female who is a primary care patient of Cannady, Dorie Rank, NP. Christine Booth was referred to the pharmacist for assistance related to DM.    Successful contact was made with the patient to discuss pharmacy services including being ready for the pharmacist to call at least 5 minutes before the scheduled appointment time, to have medication bottles and any blood sugar or blood pressure readings ready for review. The patient agreed to meet with the pharmacist via with the pharmacist via telephone visit on (date/time).  07/31/2023  [name], RMA Care Guide [facility]  [place], [place] [address] Direct Dial: [phone] [email or web address]

## 2023-07-07 NOTE — Telephone Encounter (Signed)
Requested Prescriptions  Pending Prescriptions Disp Refills   rosuvastatin (CRESTOR) 40 MG tablet [Pharmacy Med Name: ROSUVASTATIN CALCIUM 40 MG TAB] 90 tablet 3    Sig: TAKE 1 TABLET BY MOUTH EVERY DAY     Cardiovascular:  Antilipid - Statins 2 Failed - 07/06/2023  2:35 AM      Failed - Lipid Panel in normal range within the last 12 months    Cholesterol, Total  Date Value Ref Range Status  06/26/2023 100 100 - 199 mg/dL Final   Cholesterol Piccolo, Waived  Date Value Ref Range Status  05/04/2019 160 <200 mg/dL Final    Comment:                            Desirable                <200                         Borderline High      200- 239                         High                     >239    LDL Chol Calc (NIH)  Date Value Ref Range Status  06/26/2023 27 0 - 99 mg/dL Final   HDL  Date Value Ref Range Status  06/26/2023 55 >39 mg/dL Final   Triglycerides  Date Value Ref Range Status  06/26/2023 93 0 - 149 mg/dL Final   Triglycerides Piccolo,Waived  Date Value Ref Range Status  05/04/2019 108 <150 mg/dL Final    Comment:                            Normal                   <150                         Borderline High     150 - 199                         High                200 - 499                         Very High                >499          Passed - Cr in normal range and within 360 days    Creatinine, Ser  Date Value Ref Range Status  06/26/2023 0.59 0.57 - 1.00 mg/dL Final         Passed - Patient is not pregnant      Passed - Valid encounter within last 12 months    Recent Outpatient Visits           1 week ago Type 2 diabetes mellitus with hyperglycemia, without long-term current use of insulin (HCC)   [place] [facility] [place], [place] T, NP   2 months ago Type 2 diabetes mellitus with hyperglycemia, without long-term current use of insulin (HCC)   [place]  [facility] [place], Amesti T, NP   3 months ago Type 2  diabetes mellitus with hyperglycemia, without long-term current use of insulin (HCC)   [place] [facility] [place], Corrie Dandy T, NP   6 months ago Medicare annual wellness visit, subsequent   [place] [facility] [place], Dwight T, NP   7 months ago COVID   [place] [facility] [place], [place], DO       Future Appointments             In 2 months Cannady, Dorie Rank, NP  [facility], [facility]

## 2023-07-22 ENCOUNTER — Telehealth: Payer: Self-pay

## 2023-07-22 NOTE — Telephone Encounter (Signed)
4 boxes of Ozempic received for the patient. Called and notified her that it was ready to be picked up.  

## 2023-07-24 NOTE — Telephone Encounter (Signed)
Medication picked up by the patient.  

## 2023-07-31 ENCOUNTER — Other Ambulatory Visit: Payer: Medicare Other

## 2023-07-31 NOTE — Progress Notes (Signed)
07/31/2023 Name: Christine Booth MRN: [id number] DOB: [birth year]  Chief Complaint  Patient presents with   Medication Management   Christine Booth is a 67 y.o. year old female who presented for a telephone visit.   They were referred to the pharmacist by their PCP to establish care with [facility] Pharm[facility]  Subjective:  Care Team: Primary Care Provider: Marjie Skiff, NP ; Next Scheduled Visit: 10/25  Medication Access/Adherence  Current Pharmacy:  [id number] - [place], [place] - [address] [place] [address] Phone: [phone] Fax: [phone]  [facility] - [place], [place] - [address] [place] [address] Phone: [phone] Fax: [phone]  -Patient reports affordability concerns with their medications: No  -Patient reports access/transportation concerns to their pharmacy: No  -Patient reports adherence concerns with their medications:  No    Diabetes: Current medications: Farxiga 10mg  daily, Ozempic 2mg  weekly, glipizide 7.5mg  before breakfast -Current glucose readings: FBG 168, which patient states is normal for her -Patient reports hypoglycemic s/sx including dizziness, shakiness, sweating. This has happened on 2 occasions- once the patient did not eat breakfast, and the other she had breakfast but attributes it to being out of her routine. -Patient denies hyperglycemic symptoms including polyuria, polydipsia, polyphagia, nocturia, neuropathy, blurred vision. -Current medication access support: Farxiga through [employer] and Ozempic through [employer]  Hypertension: Current medications: amlodipine 2.5mg  daily, olmesartan 40mg  daily -Patient has a validated, automated, upper arm home BP cuff -Current blood pressure readings readings: 118/65 -Patient denies hypotensive s/sx including dizziness, lightheadedness.  -Patient denies hypertensive symptoms including headache, chest pain, shortness of breath  Hyperlipidemia/ASCVD  Risk Reduction Current lipid lowering medications: rosuvastatin 40mg  daily  Antiplatelet regimen: None  Objective: Lab Results  Component Value Date   HGBA1C 7.4 (H) 06/26/2023   Lab Results  Component Value Date   CREATININE 0.59 06/26/2023   BUN 12 06/26/2023   NA 142 06/26/2023   K 3.6 06/26/2023   CL 105 06/26/2023   CO2 20 06/26/2023   Lab Results  Component Value Date   CHOL 100 06/26/2023   HDL 55 06/26/2023   LDLCALC 27 06/26/2023   TRIG 93 06/26/2023   CHOLHDL 1.8 06/26/2023   Medications Reviewed Today     Reviewed by [name], RPH (Pharmacist) on 07/31/23 at [phone]  Med List Status: <None>   Medication Order Taking? Sig Documenting Provider Last Dose Status Informant  amLODipine (NORVASC) 2.5 MG tablet [id number] Yes TAKE 1 TABLET BY MOUTH EVERY DAY Cannady, Jolene T, NP Taking Active   cetirizine (ZYRTEC) 10 MG tablet [id number] Yes Take 10 mg by mouth daily. [provider] Taking Active   dapagliflozin propanediol (FARXIGA) 10 MG TABS tablet [id number] Yes Take by mouth daily. [provider] Taking Active            Med Note RAVENCRAFT, Felis Quillin A   Fri Jul 31, 2023  9:34 AM) PAP  fluticasone (FLONASE) 50 MCG/ACT nasal spray [id number] Yes SPRAY 2 SPRAYS INTO EACH NOSTRIL EVERY DAY Cannady, Jolene T, NP Taking Active   glipiZIDE (GLUCOTROL) 5 MG tablet [id number] Yes Take 1.5 tablets (7.5 mg total) by mouth daily before breakfast. Aura Dials T, NP Taking Active   glucose blood test strip [id number]  Use as instructed Gabriel Cirri, NP  Active   Multiple Vitamins-Minerals (CENTRUM WOMEN) TABS [id number] Yes Take 1 tablet by mouth daily. [provider] Taking Active   olmesartan (  BENICAR) 40 MG tablet [id number] Yes TAKE 1 TABLET BY MOUTH EVERY DAY Cannady, Jolene T, NP Taking Active   pantoprazole (PROTONIX) 40 MG tablet [id number] Yes TAKE 1 TABLET BY MOUTH EVERY DAY Cannady, Jolene T, NP Taking Active   rosuvastatin (CRESTOR) 40 MG  tablet [id number] Yes TAKE 1 TABLET BY MOUTH EVERY DAY Cannady, Jolene T, NP Taking Active   Semaglutide, 2 MG/DOSE, 8 MG/3ML SOPN [id number] Yes Inject 2 mg into the skin once a week. Aura Dials T, NP Taking Active            Med Note EISENBRAUN, Kratos Ruscitti A   Fri Jul 31, 2023  9:36 AM) PAP  sennosides-docusate sodium (SENOKOT-S) 8.6-50 MG tablet [id number] Yes Take 1 tablet by mouth daily as needed for constipation. [provider] Taking Active            Assessment/Plan:   Diabetes: - Currently uncontrolled - Patient sees PCP 10/25 and is due for A1c; if >7, I recommend increasing glipizide to 5mg  BID (with breakfast and supper) - Informing medication assistance team that patient has Ozempic and Farxiga PAP, so they can assist with program renewals when due  Hypertension: - Currently controlled - Continue current regimen and regular follow-up with PCP  Hyperlipidemia/ASCVD Risk Reduction: - Currently controlled.  - Continue current regimen and regular follow-up with PCP  Follow Up Plan: Telephone visit scheduled for 01/29/24 unless needs arise prior  [name], PharmD, DPLA

## 2023-09-18 ENCOUNTER — Other Ambulatory Visit: Payer: Self-pay | Admitting: Nurse Practitioner

## 2023-09-18 NOTE — Telephone Encounter (Signed)
No longer on this dosing- patient has had increase Requested Prescriptions  Pending Prescriptions Disp Refills   glipiZIDE (GLUCOTROL) 5 MG tablet [Pharmacy Med Name: GLIPIZIDE 5 MG TABLET] 90 tablet 1    Sig: TAKE 1 TABLET BY MOUTH DAILY BEFORE BREAKFAST.     Endocrinology:  Diabetes - Sulfonylureas Passed - 09/18/2023  1:25 AM      Passed - HBA1C is between 0 and 7.9 and within 180 days    HB A1C (BAYER DCA - WAIVED)  Date Value Ref Range Status  06/26/2023 7.4 (H) 4.8 - 5.6 % Final    Comment:             Prediabetes: 5.7 - 6.4          Diabetes: >6.4          Glycemic control for adults with diabetes: <7.0          Passed - Cr in normal range and within 360 days    Creatinine, Ser  Date Value Ref Range Status  06/26/2023 0.59 0.57 - 1.00 mg/dL Final         Passed - Valid encounter within last 6 months    Recent Outpatient Visits           2 months ago Type 2 diabetes mellitus with hyperglycemia, without long-term current use of insulin (HCC)   [place] [facility] [place], [place] T, NP   4 months ago Type 2 diabetes mellitus with hyperglycemia, without long-term current use of insulin (HCC)   [place] [facility] [place], [place] T, NP   5 months ago Type 2 diabetes mellitus with hyperglycemia, without long-term current use of insulin (HCC)   [place] [facility] [place], Corrie Dandy T, NP   8 months ago Medicare annual wellness visit, subsequent   [facility] [place], Herre[place] T, NP   10 months ago COVID   [place] [facility] [place], [place], DO       Future Appointments             In 2 weeks Cannady, Dorie Rank, NP [place] [facility], [facility]

## 2023-09-26 ENCOUNTER — Other Ambulatory Visit: Payer: Self-pay | Admitting: Nurse Practitioner

## 2023-09-26 DIAGNOSIS — E1165 Type 2 diabetes mellitus with hyperglycemia: Secondary | ICD-10-CM

## 2023-09-26 DIAGNOSIS — E1159 Type 2 diabetes mellitus with other circulatory complications: Secondary | ICD-10-CM

## 2023-09-27 NOTE — Patient Instructions (Signed)
 Be Involved in Caring For Your Health:  Taking Medications When medications are taken as directed, they can greatly improve your health. But if they are not taken as prescribed, they may not work. In some cases, not taking them correctly can be harmful. To help ensure your treatment remains effective and safe, understand your medications and how to take them. Bring your medications to each visit for review by your provider.  Your lab results, notes, and after visit summary will be available on My Chart. We strongly encourage you to use this feature. If lab results are abnormal the clinic will contact you with the appropriate steps. If the clinic does not contact you assume the results are satisfactory. You can always view your results on My Chart. If you have questions regarding your health or results, please contact the clinic during office hours. You can also ask questions on My Chart.  We at [facility] are grateful that you chose [place] to provide your care. We strive to provide evidence-based and compassionate care and are always looking for feedback. If you get a survey from the clinic please complete this so we can hear your opinions.  Diabetes Mellitus and Nutrition, Adult When you have diabetes, or diabetes mellitus, it is very important to have healthy eating habits because your blood sugar (glucose) levels are greatly affected by what you eat and drink. Eating healthy foods in the right amounts, at about the same times every day, can help you: Manage your blood glucose. Lower your risk of heart disease. Improve your blood pressure. Reach or maintain a healthy weight. What can affect my meal plan? Every person with diabetes is different, and each person has different needs for a meal plan. Your health care provider may recommend that you work with a dietitian to make a meal plan that is best for you. Your meal plan may vary depending on factors such as: The calories you need. The  medicines you take. Your weight. Your blood glucose, blood pressure, and cholesterol levels. Your activity level. Other health conditions you have, such as heart or kidney disease. How do carbohydrates affect me? Carbohydrates, also called carbs, affect your blood glucose level more than any other type of food. Eating carbs raises the amount of glucose in your blood. It is important to know how many carbs you can safely have in each meal. This is different for every person. Your dietitian can help you calculate how many carbs you should have at each meal and for each snack. How does alcohol affect me? Alcohol can cause a decrease in blood glucose (hypoglycemia), especially if you use insulin or take certain diabetes medicines by mouth. Hypoglycemia can be a life-threatening condition. Symptoms of hypoglycemia, such as sleepiness, dizziness, and confusion, are similar to symptoms of having too much alcohol. Do not drink alcohol if: Your health care provider tells you not to drink. You are pregnant, may be pregnant, or are planning to become pregnant. If you drink alcohol: Limit how much you have to: 0-1 drink a day for women. 0-2 drinks a day for men. Know how much alcohol is in your drink. In the [place] one drink equals one 12 oz bottle of beer (355 mL), one 5 oz glass of wine (148 mL), or one 1 oz glass of hard liquor (44 mL). Keep yourself hydrated with water, diet soda, or unsweetened iced tea. Keep in mind that regular soda, juice, and other mixers may contain a lot of sugar and must  be counted as carbs. What are tips for following this plan?  Reading food labels Start by checking the serving size on the Nutrition Facts label of packaged foods and drinks. The number of calories and the amount of carbs, fats, and other nutrients listed on the label are based on one serving of the item. Many items contain more than one serving per package. Check the total grams (g) of carbs in one  serving. Check the number of grams of saturated fats and trans fats in one serving. Choose foods that have a low amount or none of these fats. Check the number of milligrams (mg) of salt (sodium) in one serving. Most people should limit total sodium intake to less than 2,300 mg per day. Always check the nutrition information of foods labeled as "low-fat" or "nonfat." These foods may be higher in added sugar or refined carbs and should be avoided. Talk to your dietitian to identify your daily goals for nutrients listed on the label. Shopping Avoid buying canned, pre-made, or processed foods. These foods tend to be high in fat, sodium, and added sugar. Shop around the outside edge of the grocery store. This is where you will most often find fresh fruits and vegetables, bulk grains, fresh meats, and fresh dairy products. Cooking Use low-heat cooking methods, such as baking, instead of high-heat cooking methods, such as deep frying. Cook using healthy oils, such as olive, canola, or sunflower oil. Avoid cooking with butter, cream, or high-fat meats. Meal planning Eat meals and snacks regularly, preferably at the same times every day. Avoid going long periods of time without eating. Eat foods that are high in fiber, such as fresh fruits, vegetables, beans, and whole grains. Eat 4-6 oz (112-168 g) of lean protein each day, such as lean meat, chicken, fish, eggs, or tofu. One ounce (oz) (28 g) of lean protein is equal to: 1 oz (28 g) of meat, chicken, or fish. 1 egg.  cup (62 g) of tofu. Eat some foods each day that contain healthy fats, such as avocado, nuts, seeds, and fish. What foods should I eat? Fruits Berries. Apples. Oranges. Peaches. Apricots. Plums. Grapes. Mangoes. Papayas. Pomegranates. Kiwi. Cherries. Vegetables Leafy greens, including lettuce, spinach, kale, chard, collard greens, mustard greens, and cabbage. Beets. Cauliflower. Broccoli. Carrots. Green beans. Tomatoes. Peppers.  Onions. Cucumbers. Brussels sprouts. Grains Whole grains, such as whole-wheat or whole-grain bread, crackers, tortillas, cereal, and pasta. Unsweetened oatmeal. Quinoa. Brown or wild rice. Meats and other proteins Seafood. Poultry without skin. Lean cuts of poultry and beef. Tofu. Nuts. Seeds. Dairy Low-fat or fat-free dairy products such as milk, yogurt, and cheese. The items listed above may not be a complete list of foods and beverages you can eat and drink. Contact a dietitian for more information. What foods should I avoid? Fruits Fruits canned with syrup. Vegetables Canned vegetables. Frozen vegetables with butter or cream sauce. Grains Refined white flour and flour products such as bread, pasta, snack foods, and cereals. Avoid all processed foods. Meats and other proteins Fatty cuts of meat. Poultry with skin. Breaded or fried meats. Processed meat. Avoid saturated fats. Dairy Full-fat yogurt, cheese, or milk. Beverages Sweetened drinks, such as soda or iced tea. The items listed above may not be a complete list of foods and beverages you should avoid. Contact a dietitian for more information. Questions to ask a health care provider Do I need to meet with a certified diabetes care and education specialist? Do I need to meet with a  dietitian? What number can I call if I have questions? When are the best times to check my blood glucose? Where to find more information: American Diabetes Association: diabetes.org Academy of Nutrition and Dietetics: eatright.[employer] of Diabetes and Digestive and Kidney Diseases: StageSync.si Association of Diabetes Care & Education Specialists: diabeteseducator.org Summary It is important to have healthy eating habits because your blood sugar (glucose) levels are greatly affected by what you eat and drink. It is important to use alcohol carefully. A healthy meal plan will help you manage your blood glucose and lower your risk of  heart disease. Your health care provider may recommend that you work with a dietitian to make a meal plan that is best for you. This information is not intended to replace advice given to you by your health care provider. Make sure you discuss any questions you have with your health care provider. Document Revised: 06/27/2020 Document Reviewed: 06/27/2020 Elsevier Patient Education  2024 [employer].

## 2023-09-28 NOTE — Telephone Encounter (Signed)
Requested Prescriptions  Pending Prescriptions Disp Refills   olmesartan (BENICAR) 40 MG tablet [Pharmacy Med Name: OLMESARTAN MEDOXOMIL 40 MG TAB] 90 tablet 0    Sig: TAKE 1 TABLET BY MOUTH EVERY DAY     Cardiovascular:  Angiotensin Receptor Blockers Passed - 09/26/2023  1:16 AM      Passed - Cr in normal range and within 180 days    Creatinine, Ser  Date Value Ref Range Status  06/26/2023 0.59 0.57 - 1.00 mg/dL Final         Passed - K in normal range and within 180 days    Potassium  Date Value Ref Range Status  06/26/2023 3.6 3.5 - 5.2 mmol/L Final         Passed - Patient is not pregnant      Passed - Last BP in normal range    BP Readings from Last 1 Encounters:  06/26/23 116/75         Passed - Valid encounter within last 6 months    Recent Outpatient Visits           3 months ago Type 2 diabetes mellitus with hyperglycemia, without long-term current use of insulin (HCC)   [facility] [place], [place] T, NP   5 months ago Type 2 diabetes mellitus with hyperglycemia, without long-term current use of insulin (HCC)   [facility] [place], [place] T, NP   6 months ago Type 2 diabetes mellitus with hyperglycemia, without long-term current use of insulin (HCC)   [facility] [place], Corrie Dandy T, NP   9 months ago Medicare annual wellness visit, subsequent   [facility] [place], [place] T, NP   10 months ago COVID   [place] [facility] [place], [place], DO       Future Appointments             In 4 days [place], Dorie Rank, NP [facility] [facility] [employer] [facility]

## 2023-10-02 ENCOUNTER — Ambulatory Visit (INDEPENDENT_AMBULATORY_CARE_PROVIDER_SITE_OTHER): Payer: Medicare Other | Admitting: Nurse Practitioner

## 2023-10-02 ENCOUNTER — Encounter: Payer: Self-pay | Admitting: Nurse Practitioner

## 2023-10-02 VITALS — BP 132/70 | HR 77 | Temp 97.8°F | Ht 61.0 in | Wt 166.2 lb

## 2023-10-02 DIAGNOSIS — E876 Hypokalemia: Secondary | ICD-10-CM | POA: Diagnosis not present

## 2023-10-02 DIAGNOSIS — R011 Cardiac murmur, unspecified: Secondary | ICD-10-CM

## 2023-10-02 DIAGNOSIS — E1169 Type 2 diabetes mellitus with other specified complication: Secondary | ICD-10-CM

## 2023-10-02 DIAGNOSIS — E66811 Obesity, class 1: Secondary | ICD-10-CM

## 2023-10-02 DIAGNOSIS — E1165 Type 2 diabetes mellitus with hyperglycemia: Secondary | ICD-10-CM

## 2023-10-02 DIAGNOSIS — I152 Hypertension secondary to endocrine disorders: Secondary | ICD-10-CM | POA: Diagnosis not present

## 2023-10-02 DIAGNOSIS — E785 Hyperlipidemia, unspecified: Secondary | ICD-10-CM | POA: Diagnosis not present

## 2023-10-02 DIAGNOSIS — Z6830 Body mass index (BMI) 30.0-30.9, adult: Secondary | ICD-10-CM

## 2023-10-02 DIAGNOSIS — E6609 Other obesity due to excess calories: Secondary | ICD-10-CM

## 2023-10-02 DIAGNOSIS — Z7985 Long-term (current) use of injectable non-insulin antidiabetic drugs: Secondary | ICD-10-CM | POA: Diagnosis not present

## 2023-10-02 DIAGNOSIS — E1159 Type 2 diabetes mellitus with other circulatory complications: Secondary | ICD-10-CM | POA: Diagnosis not present

## 2023-10-02 LAB — BAYER DCA HB A1C WAIVED: HB A1C (BAYER DCA - WAIVED): 7.4 % — ABNORMAL HIGH (ref 4.8–5.6)

## 2023-10-02 MED ORDER — AMLODIPINE BESYLATE 2.5 MG PO TABS: 2.5000 mg | ORAL_TABLET | Freq: Every day | ORAL | 4 refills | Status: DC

## 2023-10-02 NOTE — Assessment & Plan Note (Signed)
No symptoms, present for years.  Will consider echo if symptoms present -- discussed with patient. 

## 2023-10-02 NOTE — Progress Notes (Signed)
BP 132/70 (BP Location: Left Arm, Patient Position: Sitting)   Pulse 77   Temp 97.8 F (36.6 C) (Oral)   Ht 5\' 1"  (1.549 m)   Wt 166 lb 3.2 oz (75.4 kg)   LMP  (LMP Unknown)   SpO2 97%   BMI 31.40 kg/m    Subjective:    Patient ID: Christine Booth, female    DOB: [birth year], 67 y.o.   MRN: [id number]  HPI: Christine Booth is a 67 y.o. female  Chief Complaint  Patient presents with   Diabetes   Hypertension   Hyperlipidemia   DIABETES A1c 7.4% in July.  Taking Farxiga 10 MG daily (assistance), Ozempic 2 MG weekly (assistance), Glipizide 7.5 MG daily.   Is doing "old lady" exercise in home daily.  Focusing on diet changes.  At times forgets to cut Glipizide in half for the 7.5 MG (taking 5 MG often during past few weeks).   Metformin initially, caused some severe constipation.   Hypoglycemic episodes:no Polydipsia/polyuria: no Visual disturbance: no Chest pain: no Paresthesias: no Glucose Monitoring: yes             Accucheck frequency: not checking             Fasting glucose: 160-170             Post prandial:             Evening:             Before meals: Taking Insulin?: no             Long acting insulin:             Short acting insulin: Blood Pressure Monitoring: daily Retinal Examination: Up To Date - Dr. Katherina Mires [place] Foot Exam: Up to Date Pneumovax: Up to Date Influenza: Up to Date Aspirin: yes    HYPERTENSION / HYPERLIPIDEMIA Continues Olmesartan 40 MG daily, Amlodipine 2.5 MG daily, and Rosuvastatin 40 MG daily.  History of low K+, but on last 3 checks have been normal. Satisfied with current treatment? yes Duration of hypertension: chronic BP monitoring frequency: rarely BP range: BP medication side effects: no Duration of hyperlipidemia: chronic Cholesterol medication side effects: no Cholesterol supplements: none Medication compliance: good compliance Aspirin: yes Recent stressors: no Recurrent headaches: none Visual  changes: no Palpitations: no Dyspnea: no Chest pain: no Lower extremity edema: no Dizzy/lightheaded: no   Relevant past medical, surgical, family and social history reviewed and updated as indicated. Interim medical history since our last visit reviewed. Allergies and medications reviewed and updated.  Review of Systems  Constitutional:  Negative for activity change, appetite change, diaphoresis, fatigue and fever.  Respiratory:  Negative for cough, chest tightness and shortness of breath.   Cardiovascular:  Negative for chest pain, palpitations and leg swelling.  Gastrointestinal: Negative.   Endocrine: Negative for polydipsia, polyphagia and polyuria.  Neurological: Negative.   Psychiatric/Behavioral: Negative.     Per HPI unless specifically indicated above     Objective:    BP 132/70 (BP Location: Left Arm, Patient Position: Sitting)   Pulse 77   Temp 97.8 F (36.6 C) (Oral)   Ht 5\' 1"  (1.549 m)   Wt 166 lb 3.2 oz (75.4 kg)   LMP  (LMP Unknown)   SpO2 97%   BMI 31.40 kg/m   Wt Readings from Last 3 Encounters:  10/02/23 166 lb 3.2 oz (75.4 kg)  06/26/23 161 lb 6.4 oz (73.2 kg)  04/24/23  160 lb 6.4 oz (72.8 kg)    Physical Exam Vitals and nursing note reviewed.  Constitutional:      General: She is awake. She is not in acute distress.    Appearance: She is well-developed, well-groomed and overweight. She is not ill-appearing.  HENT:     Head: Normocephalic.     Right Ear: Hearing, ear canal and external ear normal. No drainage.     Left Ear: Hearing, ear canal and external ear normal. No drainage.     Ears:     Comments:      Mouth/Throat:     Mouth: Mucous membranes are moist.  Eyes:     General: Lids are normal.        Right eye: No discharge.        Left eye: No discharge.     Conjunctiva/sclera: Conjunctivae normal.     Pupils: Pupils are equal, round, and reactive to light.  Neck:     Thyroid: No thyromegaly.     Vascular: No carotid bruit.   Cardiovascular:     Rate and Rhythm: Normal rate and regular rhythm.     Heart sounds: Murmur heard.     Systolic murmur is present with a grade of 2/6.     No gallop.  Pulmonary:     Effort: Pulmonary effort is normal. No accessory muscle usage or respiratory distress.     Breath sounds: Normal breath sounds.  Abdominal:     General: Bowel sounds are normal.     Palpations: Abdomen is soft.  Musculoskeletal:     Cervical back: Normal range of motion and neck supple.     Right lower leg: No edema.     Left lower leg: No edema.  Skin:    General: Skin is warm and dry.  Neurological:     Mental Status: She is alert and oriented to person, place, and time.  Psychiatric:        Attention and Perception: Attention normal.        Mood and Affect: Mood normal.        Behavior: Behavior normal. Behavior is cooperative.        Thought Content: Thought content normal.        Judgment: Judgment normal.    Results for orders placed or performed in visit on 06/26/23  Bayer DCA Hb A1c Waived  Result Value Ref Range   HB A1C (BAYER DCA - WAIVED) 7.4 (H) 4.8 - 5.6 %  Basic Metabolic Panel (BMET)  Result Value Ref Range   Glucose 129 (H) 70 - 99 mg/dL   BUN 12 8 - 27 mg/dL   Creatinine, Ser 1.61 0.57 - 1.00 mg/dL   eGFR 98 >09 UE/AVW/0.98   BUN/Creatinine Ratio 20 12 - 28   Sodium 142 134 - 144 mmol/L   Potassium 3.6 3.5 - 5.2 mmol/L   Chloride 105 96 - 106 mmol/L   CO2 20 20 - 29 mmol/L   Calcium 9.4 8.7 - 10.3 mg/dL  Lipid panel  Result Value Ref Range   Cholesterol, Total 100 100 - 199 mg/dL   Triglycerides 93 0 - 149 mg/dL   HDL 55 >11 mg/dL   VLDL Cholesterol Cal 18 5 - 40 mg/dL   LDL Chol Calc (NIH) 27 0 - 99 mg/dL   Chol/HDL Ratio 1.8 0.0 - 4.4 ratio      Assessment & Plan:   Problem List Items Addressed This Visit  Cardiovascular and Mediastinum   Hypertension associated with diabetes (HCC)    Chronic, stable.  BP remains at or below goal on office checks and  at home per her report.  Continue Olmesartan and Amlodipine at current dosing and adjust as needed.  LABS: CMP.  Continue to monitor BP at home regularly and focus on DASH diet + regular exercise.        Relevant Medications   amLODipine (NORVASC) 2.5 MG tablet     Endocrine   Hyperlipidemia associated with type 2 diabetes mellitus (HCC)    Chronic, stable. Continue current medication regimen and adjust as needed.  Lipid panel today.      Relevant Medications   amLODipine (NORVASC) 2.5 MG tablet   Other Relevant Orders   Comprehensive metabolic panel   Lipid Panel w/o Chol/HDL Ratio   Type 2 diabetes mellitus with hyperglycemia (HCC) - Primary    Chronic, ongoing with A1c 7.4% today but has not taken 7.5 MG Glipizide consistently and urine ALB 30 in January 2024 -- continue ARB for kidney protection.   - At this time continue Ozempic, she is at max dose, gets via assistance and Farxiga 10 MG daily (which she just receives via assistance). - Continue Glipizide at 7.5 MG every morning, which is cost effective and may help lower levels further.  Educated her on hypoglycemia risk with this.  She will monitor and alert provider if BS consistently <70.  Could consider Mounjaro in future and stopping Ozempic if assistance program available in future. - Avoid Metformin as did not tolerate in past.   - Recommend continue to check BS daily and return to regular exercise and diet focus to maintain A1C <7.   - Eye and foot exams up to date.  Pneumococcal and flu vaccines up to date. Return in 3 months.      Relevant Orders   Bayer DCA Hb A1c Waived (STAT)     Other   Hypokalemia    Recheck level today, recent 3.6, stable and she is adding to diet.  If remains stable over next couple checks will discontinue this diagnosis.      Relevant Orders   Comprehensive metabolic panel   Obesity    BMI 31.40 is working on exercise regimen.  Recommended eating smaller high protein, low fat meals more  frequently and exercising 30 mins a day 5 times a week with a goal of 10-15lb weight loss in the next 3 months. Patient voiced their understanding and motivation to adhere to these recommendations.       Systolic murmur    No symptoms, present for years.  Will consider echo if symptoms present -- discussed with patient.        Follow up plan: Return in about 3 months (around 01/02/2024) for T2DM, HTN/HLD.

## 2023-10-02 NOTE — Assessment & Plan Note (Signed)
Chronic, stable.  Continue current medication regimen and adjust as needed.  Lipid panel today. 

## 2023-10-02 NOTE — Assessment & Plan Note (Signed)
BMI 31.40 is working on exercise regimen.  Recommended eating smaller high protein, low fat meals more frequently and exercising 30 mins a day 5 times a week with a goal of 10-15lb weight loss in the next 3 months. Patient voiced their understanding and motivation to adhere to these recommendations.

## 2023-10-02 NOTE — Assessment & Plan Note (Signed)
Chronic, stable.  BP remains at or below goal on office checks and at home per her report.  Continue Olmesartan and Amlodipine at current dosing and adjust as needed.  LABS: CMP.  Continue to monitor BP at home regularly and focus on DASH diet + regular exercise.

## 2023-10-02 NOTE — Assessment & Plan Note (Signed)
Chronic, ongoing with A1c 7.4% today but has not taken 7.5 MG Glipizide consistently and urine ALB 30 in January 2024 -- continue ARB for kidney protection.   - At this time continue Ozempic, she is at max dose, gets via assistance and Farxiga 10 MG daily (which she just receives via assistance). - Continue Glipizide at 7.5 MG every morning, which is cost effective and may help lower levels further.  Educated her on hypoglycemia risk with this.  She will monitor and alert provider if BS consistently <70.  Could consider Mounjaro in future and stopping Ozempic if assistance program available in future. - Avoid Metformin as did not tolerate in past.   - Recommend continue to check BS daily and return to regular exercise and diet focus to maintain A1C <7.   - Eye and foot exams up to date.  Pneumococcal and flu vaccines up to date. Return in 3 months.

## 2023-10-02 NOTE — Assessment & Plan Note (Signed)
Recheck level today, recent 3.6, stable and she is adding to diet.  If remains stable over next couple checks will discontinue this diagnosis.

## 2023-10-03 LAB — LIPID PANEL W/O CHOL/HDL RATIO
Cholesterol, Total: 165 mg/dL (ref 100–199)
HDL: 50 mg/dL (ref >39)
LDL Chol Calc (NIH): 88 mg/dL (ref 0–99)
Triglycerides: 157 mg/dL — ABNORMAL HIGH (ref 0–149)
VLDL Cholesterol Cal: 27 mg/dL (ref 5–40)

## 2023-10-03 LAB — COMPREHENSIVE METABOLIC PANEL
ALT: 18 [IU]/L (ref 0–32)
AST: 22 [IU]/L (ref 0–40)
Albumin: 4.5 g/dL (ref 3.9–4.9)
Alkaline Phosphatase: 64 [IU]/L (ref 44–121)
BUN/Creatinine Ratio: 16 (ref 12–28)
BUN: 9 mg/dL (ref 8–27)
Bilirubin Total: 0.5 mg/dL (ref 0.0–1.2)
CO2: 22 mmol/L (ref 20–29)
Calcium: 9.6 mg/dL (ref 8.7–10.3)
Chloride: 107 mmol/L — ABNORMAL HIGH (ref 96–106)
Creatinine, Ser: 0.56 mg/dL — ABNORMAL LOW (ref 0.57–1.00)
Globulin, Total: 2.2 g/dL (ref 1.5–4.5)
Glucose: 95 mg/dL (ref 70–99)
Potassium: 3.5 mmol/L (ref 3.5–5.2)
Sodium: 144 mmol/L (ref 134–144)
Total Protein: 6.7 g/dL (ref 6.0–8.5)
eGFR: 99 mL/min/{1.73_m2} (ref >59)

## 2023-10-03 NOTE — Progress Notes (Signed)
Contacted via MyChart   Good morning Christine Booth, your labs have returned: - Kidney function, creatinine and eGFR, remains normal, as is liver function, AST and ALT.  - Cholesterol levels show trend up in LDL from all previous checks.  Are you taking Rosuvastatin daily?  Were you fasting? Let me know and we will recheck next visit with you fasting.  Any questions? Keep being stellar!!  Thank you for allowing me to participate in your care.  I appreciate you. Kindest regards, Jaquawn Saffran

## 2023-10-05 ENCOUNTER — Other Ambulatory Visit: Payer: Self-pay

## 2023-10-05 MED ORDER — SEMAGLUTIDE (2 MG/DOSE) 8 MG/3ML ~~LOC~~ SOPN: 2.0000 mg | PEN_INJECTOR | SUBCUTANEOUS | 1 refills | Status: DC

## 2023-10-05 NOTE — Telephone Encounter (Signed)
Need printed RX to sent to [employer] for patient please.

## 2023-10-06 ENCOUNTER — Other Ambulatory Visit: Payer: Medicare Other

## 2023-10-06 NOTE — Progress Notes (Unsigned)
   10/06/2023  Patient ID: Christine Booth, female   DOB: 14-Feb-1956, 67 y.o.   MRN: [id number]  Patient outreach to assist with re-enrollment in [employer] PAP for Farxiga 10mg  daily  -Patient received renewal information from [employer] but was not able to provide consent online -Requested link to be emailed/texted to be able to provide consent since website is not working appropriately but this was never received -Contacted [employer], and they state the 1st page of the current application for PAP can be completed by patient and faxed in which will meet the consent requirement for 2025 enrollment -Informed patient, and she is coming into [facility] 10/30 at 330pm for [place] to complete this  [name], PharmD, DPLA

## 2023-10-07 ENCOUNTER — Other Ambulatory Visit: Payer: Medicare Other

## 2023-10-07 NOTE — Progress Notes (Signed)
   10/07/2023  Patient ID: Christine Booth, female   DOB: Sep 04, 1956, 67 y.o.   MRN: [id number].  Patient presenting to [facility] to complete paperwork for [employer] PAP re-enrollment for 2025.  She currently receives [place] 10mg  through PAP and has been unable to complete the renewal process online.  Patient verified demographic portion of application I had prefilled; and she signed/dated the application.  This has been faxed to [employer], and I will verify company has received all needed information next week.  [name], PharmD, DPLA

## 2023-10-16 DIAGNOSIS — Z1231 Encounter for screening mammogram for malignant neoplasm of breast: Secondary | ICD-10-CM | POA: Diagnosis not present

## 2023-10-16 LAB — HM MAMMOGRAPHY

## 2023-10-16 NOTE — Progress Notes (Signed)
   10/16/2023  Patient ID: Christine Booth, female   DOB: 04-Jan-1956, 67 y.o.   MRN: [id number]  Contacted [employer] to verify PAP renewal application for Farxiga for 2025 was received.  The application was received but denied based on [facility] being above the max allowed for eligibility.  Contacting patient via MyChart message to verify [facility] and household size on application are correct.  Unless something changed from last year, she should still qualify.  [name], PharmD, DPLA

## 2023-10-19 NOTE — Progress Notes (Signed)
   10/19/2023  Patient ID: Christine Booth, female   DOB: January 23, 1956, 67 y.o.   MRN: [id number]  Patient does not qualify for [place] PAP for Farxiga for 2025; but hopefully with changes to medicaid this medication will remain affordable for her.    Submitted [employer] PAP re-enrollment application online for 2025, so she will continue to receive Ozempic 2mg  from this assistance program.  [name], PharmD, DPLA

## 2023-11-06 NOTE — Progress Notes (Signed)
   11/06/2023  Patient ID: Christine Booth, female   DOB: 1956-03-16, 67 y.o.   MRN: [id number]  Contacted [employer] PAP to check on patient's 2025 renewal application, and the program states patient information is missing.  Benefit verification was able to pull patient's insurance information, but it did not save to the application.  This has been completed, and application currently in benefit verification stage, which usually takes 2-3 business days to complete processing.  [name], PharmD, DPLA

## 2023-12-14 ENCOUNTER — Other Ambulatory Visit: Payer: Self-pay

## 2023-12-14 MED ORDER — SEMAGLUTIDE (2 MG/DOSE) 8 MG/3ML ~~LOC~~ SOPN: 2.0000 mg | PEN_INJECTOR | SUBCUTANEOUS | 4 refills | Status: AC

## 2023-12-14 NOTE — Telephone Encounter (Signed)
 Needs RX sent in for patient assistance program.

## 2023-12-16 ENCOUNTER — Other Ambulatory Visit: Payer: Self-pay

## 2023-12-16 MED ORDER — DAPAGLIFLOZIN PROPANEDIOL 10 MG PO TABS: 10.0000 mg | ORAL_TABLET | Freq: Every day | ORAL | 1 refills | Status: DC

## 2023-12-23 ENCOUNTER — Other Ambulatory Visit: Payer: Self-pay | Admitting: Nurse Practitioner

## 2023-12-23 DIAGNOSIS — E1165 Type 2 diabetes mellitus with hyperglycemia: Secondary | ICD-10-CM

## 2023-12-23 DIAGNOSIS — I152 Hypertension secondary to endocrine disorders: Secondary | ICD-10-CM

## 2023-12-23 NOTE — Telephone Encounter (Signed)
 Requested Prescriptions  Pending Prescriptions Disp Refills   olmesartan (BENICAR) 40 MG tablet [Pharmacy Med Name: OLMESARTAN MEDOXOMIL 40 MG TAB] 90 tablet 0    Sig: TAKE 1 TABLET BY MOUTH EVERY DAY     Cardiovascular:  Angiotensin Receptor Blockers Failed - 12/23/2023  3:54 PM      Failed - Cr in normal range and within 180 days    Creatinine, Ser  Date Value Ref Range Status  10/02/2023 0.56 (L) 0.57 - 1.00 mg/dL Final         Passed - K in normal range and within 180 days    Potassium  Date Value Ref Range Status  10/02/2023 3.5 3.5 - 5.2 mmol/L Final         Passed - Patient is not pregnant      Passed - Last BP in normal range    BP Readings from Last 1 Encounters:  10/02/23 132/70         Passed - Valid encounter within last 6 months    Recent Outpatient Visits           2 months ago Type 2 diabetes mellitus with hyperglycemia, without long-term current use of insulin (HCC)   [facility] [place], Katherine T, NP   6 months ago Type 2 diabetes mellitus with hyperglycemia, without long-term current use of insulin (HCC)   [facility] [place], Wheeler T, NP   8 months ago Type 2 diabetes mellitus with hyperglycemia, without long-term current use of insulin (HCC)   [facility] [place], [place] T, NP   9 months ago Type 2 diabetes mellitus with hyperglycemia, without long-term current use of insulin (HCC)   [facility] [place], Sanjuan Crumbly T, NP   12 months ago Medicare annual wellness visit, subsequent   [facility] [place], Lavelle Posey, NP       Future Appointments             In 2 weeks [place], Jolene T, NP  [facility]

## 2023-12-29 ENCOUNTER — Ambulatory Visit: Payer: Medicare Other | Admitting: Emergency Medicine

## 2023-12-29 VITALS — Ht 62.0 in | Wt 160.0 lb

## 2023-12-29 DIAGNOSIS — Z0000 Encounter for general adult medical examination without abnormal findings: Secondary | ICD-10-CM | POA: Diagnosis not present

## 2023-12-29 NOTE — Patient Instructions (Addendum)
Christine Booth , Thank you for taking time to come for your Medicare Wellness Visit. I appreciate your ongoing commitment to your health goals. Please review the following plan we discussed and let me know if I can assist you in the future.   Referrals/Orders/Follow-Ups/Clinician Recommendations: Call and schedule a diabetic eye exam at your earliest convenience. Discuss with Aura Dials, NP at your next office visit if you are a candidate for the cologuard. Request the advanced directives packet at your next office visit.  This is a list of the screening recommended for you and due dates:  Health Maintenance  Topic Date Due   COVID-19 Vaccine (6 - 2024-25 season) 08/09/2023   Yearly kidney health urinalysis for diabetes  12/27/2023   Eye exam for diabetics  12/20/2023   Colon Cancer Screening  06/25/2024*   Complete foot exam   03/26/2024   Hemoglobin A1C  04/01/2024   Yearly kidney function blood test for diabetes  10/01/2024   Mammogram  10/15/2024   Medicare Annual Wellness Visit  12/28/2024   DTaP/Tdap/Td vaccine (3 - Tdap) 01/23/2028   DEXA scan (bone density measurement)  09/14/2031   Pneumonia Vaccine  Completed   Flu Shot  Completed   Hepatitis C Screening  Completed   Zoster (Shingles) Vaccine  Completed   HPV Vaccine  Aged Out  *Topic was postponed. The date shown is not the original due date.    Advanced directives: (ACP Link)Information on Advanced Care Planning can be found at [facility] Directives Advance Health Care Directives ([email or web address]   Next Medicare Annual Wellness Visit scheduled for next year: Yes, 01/03/25 @ 8:00am  Fall Prevention in the Home, Adult Falls can cause injuries and affect people of all ages. There are many simple things that you can do to make your home safe and to help prevent falls. If you need it, ask for help making these changes. What actions can I take to prevent falls? General information Use  good lighting in all rooms. Make sure to: Replace any light bulbs that burn out. Turn on lights if it is dark and use night-lights. Keep items that you use often in easy-to-reach places. Lower the shelves around your home if needed. Move furniture so that there are clear paths around it. Do not keep throw rugs or other things on the floor that can make you trip. If any of your floors are uneven, fix them. Add color or contrast paint or tape to clearly mark and help you see: Grab bars or handrails. First and last steps of staircases. Where the edge of each step is. If you use a ladder or stepladder: Make sure that it is fully opened. Do not climb a closed ladder. Make sure the sides of the ladder are locked in place. Have someone hold the ladder while you use it. Know where your pets are as you move through your home. What can I do in the bathroom?     Keep the floor dry. Clean up any water that is on the floor right away. Remove soap buildup in the bathtub or shower. Buildup makes bathtubs and showers slippery. Use non-skid mats or decals on the floor of the bathtub or shower. Attach bath mats securely with double-sided, non-slip rug tape. If you need to sit down while you are in the shower, use a non-slip stool. Install grab bars by the toilet and in the bathtub and shower. Do not use towel bars as grab  bars. What can I do in the bedroom? Make sure that you have a light by your bed that is easy to reach. Do not use any sheets or blankets on your bed that hang to the floor. Have a firm bench or chair with side arms that you can use for support when you get dressed. What can I do in the kitchen? Clean up any spills right away. If you need to reach something above you, use a sturdy step stool that has a grab bar. Keep electrical cables out of the way. Do not use floor polish or wax that makes floors slippery. What can I do with my stairs? Do not leave anything on the stairs. Make  sure that you have a light switch at the top and the bottom of the stairs. Have them installed if you do not have them. Make sure that there are handrails on both sides of the stairs. Fix handrails that are broken or loose. Make sure that handrails are as long as the staircases. Install non-slip stair treads on all stairs in your home if they do not have carpet. Avoid having throw rugs at the top or bottom of stairs, or secure the rugs with carpet tape to prevent them from moving. Choose a carpet design that does not hide the edge of steps on the stairs. Make sure that carpet is firmly attached to the stairs. Fix any carpet that is loose or worn. What can I do on the outside of my home? Use bright outdoor lighting. Repair the edges of walkways and driveways and fix any cracks. Clear paths of anything that can make you trip, such as tools or rocks. Add color or contrast paint or tape to clearly mark and help you see high doorway thresholds. Trim any bushes or trees on the main path into your home. Check that handrails are securely fastened and in good repair. Both sides of all steps should have handrails. Install guardrails along the edges of any raised decks or porches. Have leaves, snow, and ice cleared regularly. Use sand, salt, or ice melt on walkways during winter months if you live where there is ice and snow. In the garage, clean up any spills right away, including grease or oil spills. What other actions can I take? Review your medicines with your health care provider. Some medicines can make you confused or feel dizzy. This can increase your chance of falling. Wear closed-toe shoes that fit well and support your feet. Wear shoes that have rubber soles and low heels. Use a cane, walker, scooter, or crutches that help you move around if needed. Talk with your provider about other ways that you can decrease your risk of falls. This may include seeing a physical therapist to learn to do  exercises to improve movement and strength. Where to find more information Centers for Disease Control and Prevention, STEADI: TonerPromos.no [employer] on Aging: BaseRingTones.pl National Institute on Aging: BaseRingTones.pl Contact a health care provider if: You are afraid of falling at home. You feel weak, drowsy, or dizzy at home. You fall at home. Get help right away if you: Lose consciousness or have trouble moving after a fall. Have a fall that causes a head injury. These symptoms may be an emergency. Get help right away. Call 911. Do not wait to see if the symptoms will go away. Do not drive yourself to the hospital. This information is not intended to replace advice given to you by your health care provider. Make  sure you discuss any questions you have with your health care provider. Document Revised: 07/28/2022 Document Reviewed: 07/28/2022 Elsevier Patient Education  2024 [employer].

## 2023-12-29 NOTE — Progress Notes (Signed)
Subjective:   Christine Booth is a 67 y.o. female who presents for Medicare Annual (Subsequent) preventive examination.  Visit Complete: Virtual I connected with  Christine Booth on 12/29/23 by a video and audio enabled telemedicine application and verified that I am speaking with the correct person using two identifiers.  Patient Location: Home  Provider Location: Office/Clinic  I discussed the limitations of evaluation and management by telemedicine. The patient expressed understanding and agreed to proceed.  Vital Signs: Because this visit was a virtual/telehealth visit, some criteria may be missing or patient reported. Any vitals not documented were not able to be obtained and vitals that have been documented are patient reported.  Patient Medicare AWV questionnaire was completed by the patient on 12/25/23; I have confirmed that all information answered by patient is correct and no changes since this date.  Cardiac Risk Factors include: advanced age (>5men, >27 women);hypertension;diabetes mellitus;dyslipidemia     Objective:    Today's Vitals   12/29/23 0754  Weight: 160 lb (72.6 kg)  Height: 5\' 2"  (1.575 m)   Body mass index is 29.26 kg/m.     12/29/2023    8:03 AM 11/26/2021    8:18 AM 11/09/2020    1:03 PM 02/06/2017    8:07 AM  Advanced Directives  Does Patient Have a Medical Advance Directive? No No No No  Would patient like information on creating a medical advance directive? Yes (MAU/Ambulatory/Procedural Areas - Information given) No - Patient declined      Current Medications (verified) Outpatient Encounter Medications as of 12/29/2023  Medication Sig   amLODipine (NORVASC) 2.5 MG tablet Take 1 tablet (2.5 mg total) by mouth daily.   cetirizine (ZYRTEC) 10 MG tablet Take 10 mg by mouth daily.   dapagliflozin propanediol (FARXIGA) 10 MG TABS tablet Take 1 tablet (10 mg total) by mouth daily.   fluticasone (FLONASE) 50 MCG/ACT nasal spray SPRAY 2 SPRAYS  INTO EACH NOSTRIL EVERY DAY   glipiZIDE (GLUCOTROL) 5 MG tablet Take 1.5 tablets (7.5 mg total) by mouth daily before breakfast.   glucose blood test strip Use as instructed   olmesartan (BENICAR) 40 MG tablet TAKE 1 TABLET BY MOUTH EVERY DAY   pantoprazole (PROTONIX) 40 MG tablet TAKE 1 TABLET BY MOUTH EVERY DAY   rosuvastatin (CRESTOR) 40 MG tablet TAKE 1 TABLET BY MOUTH EVERY DAY   Semaglutide, 2 MG/DOSE, 8 MG/3ML SOPN Inject 2 mg into the skin once a week.   sennosides-docusate sodium (SENOKOT-S) 8.6-50 MG tablet Take 1 tablet by mouth daily as needed for constipation.   [DISCONTINUED] Multiple Vitamins-Minerals (CENTRUM WOMEN) TABS Take 1 tablet by mouth daily. (Patient not taking: Reported on 12/29/2023)   No facility-administered encounter medications on file as of 12/29/2023.    Allergies (verified) Patient has no known allergies.   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   Diabetes mellitus without complication (HCC)    Gastrointestinal disorder    GERD (gastroesophageal reflux disease)    Heart murmur    Hyperlipidemia    Hypertension    Rosacea    Past Surgical History:  Procedure Laterality Date   COLONOSCOPY WITH PROPOFOL N/A 02/06/2017   Procedure: COLONOSCOPY WITH PROPOFOL;  Surgeon: Wyline Mood, MD;  Location: [facility] ENDOSCOPY;  Service: Endoscopy;  Laterality: N/A;   TUBAL LIGATION  1985   Family History  Problem Relation Age of  Onset   Arthritis Mother    Cancer Mother        breast   Heart disease Mother    Cancer Father        bladder   Heart disease Father    Hypertension Father    Aneurysm Sister    Cancer Sister        breast   Social History   Socioeconomic History   Marital status: Divorced    Spouse name: Not on file   Number of children: 2   Years of education: Not on file   Highest education level: 12th grade  Occupational History    Not on file  Tobacco Use   Smoking status: Never   Smokeless tobacco: Never  Vaping Use   Vaping status: Never Used  Substance and Sexual Activity   Alcohol use: No    Alcohol/week: 0.0 standard drinks of alcohol   Drug use: No   Sexual activity: Not Currently  Other Topics Concern   Not on file  Social History Narrative   12/29/23 still working 4 days per week   Social Drivers of Health   Financial Resource Strain: Low Risk  (12/29/2023)   Overall Financial Resource Strain (CARDIA)    Difficulty of Paying Living Expenses: Not hard at all  Food Insecurity: No Food Insecurity (12/29/2023)   Hunger Vital Sign    Worried About Running Out of Food in the Last Year: Never true    Ran Out of Food in the Last Year: Never true  Transportation Needs: No Transportation Needs (12/29/2023)   PRAPARE - Administrator, Civil Service (Medical): No    Lack of Transportation (Non-Medical): No  Physical Activity: Insufficiently Active (12/29/2023)   Exercise Vital Sign    Days of Exercise per Week: 3 days    Minutes of Exercise per Session: 20 min  Stress: No Stress Concern Present (12/29/2023)   [employer] of Occupational Health - Occupational Stress Questionnaire    Feeling of Stress : Not at all  Social Connections: Socially Isolated (12/29/2023)   Social Connection and Isolation Panel [NHANES]    Frequency of Communication with Friends and Family: More than three times a week    Frequency of Social Gatherings with Friends and Family: More than three times a week    Attends Religious Services: Never    Database administrator or Organizations: No    Attends Engineer, structural: Never    Marital Status: Divorced    Tobacco Counseling Counseling given: Not Answered   Clinical Intake:  Pre-visit preparation completed: Yes  Pain : No/denies pain     BMI - recorded: 29.26 Nutritional Status: BMI 25 -29 Overweight Nutritional Risks: None Diabetes:  Yes CBG done?: No Did pt. bring in CBG monitor from home?: No  How often do you need to have someone help you when you read instructions, pamphlets, or other written materials from your doctor or pharmacy?: 1 - Never  Interpreter Needed?: No  Information entered by :: Tora Kindred, CMA   Activities of Daily Living    12/29/2023    7:57 AM 12/25/2023    1:40 PM  In your present state of health, do you have any difficulty performing the following activities:  Hearing? 0 0  Vision? 0 0  Difficulty concentrating or making decisions? 0 0  Walking or climbing stairs? 0 0  Dressing or bathing? 0 0  Doing errands, shopping? 0 0  Preparing Food and  eating ? N N  Using the Toilet? N N  In the past six months, have you accidently leaked urine? N N  Do you have problems with loss of bowel control? N N  Managing your Medications? N N  Managing your Finances? N N  Housekeeping or managing your Housekeeping? N N    Patient Care Team: Marjie Skiff, NP as PCP - General (Nurse Practitioner) [name], RPH (Pharmacist) Verne Carrow, OD (Optometry)  Indicate any recent Medical Services you may have received from other than [facility] providers in the past year (date may be approximate).     Assessment:   This is a routine wellness examination for [place].  Hearing/Vision screen Hearing Screening - Comments:: Slight hearing loss-declined referral to ENT Vision Screening - Comments:: Gets eye exams - [facility] [place], Dr. Clearance Coots, no retinopathy   Goals Addressed             This Visit's Progress    Patient Stated       Maintain health and cook for myself a little more      Depression Screen    12/29/2023    8:01 AM 10/02/2023    9:22 AM 06/26/2023    9:59 AM 03/27/2023    8:32 AM 12/26/2022    8:24 AM 09/19/2022    8:27 AM 06/13/2022    8:33 AM  PHQ 2/9 Scores  PHQ - 2 Score 0 0 0 0 0 0 0  PHQ- 9 Score  0 1 0 1 0 1    Fall Risk    12/29/2023    8:06 AM 12/25/2023     1:40 PM 10/02/2023    9:22 AM 06/26/2023    9:59 AM 03/27/2023    8:32 AM  Fall Risk   Falls in the past year? 1 1 0 0 0  Number falls in past yr: 0 0 0 0 0  Injury with Fall? 0 0 0 0 0  Risk for fall due to : History of fall(s);Other (Comment)  No Fall Risks No Fall Risks No Fall Risks  Risk for fall due to: Comment slipped when only had socks on      Follow up Falls prevention discussed;Education provided;Falls evaluation completed  Falls evaluation completed Falls evaluation completed Falls evaluation completed    MEDICARE RISK AT HOME: Medicare Risk at Home Any stairs in or around the home?: Yes If so, are there any without handrails?: No Home free of loose throw rugs in walkways, pet beds, electrical cords, etc?: No Adequate lighting in your home to reduce risk of falls?: Yes Life alert?: No Use of a cane, walker or w/c?: No Grab bars in the bathroom?: No Shower chair or bench in shower?: No Elevated toilet seat or a handicapped toilet?: Yes  TIMED UP AND GO:  Was the test performed?  No    Cognitive Function:        12/29/2023    8:08 AM 12/26/2022    9:20 AM 11/09/2020    1:05 PM  6CIT Screen  What Year? 0 points 0 points 0 points  What month? 0 points 0 points 0 points  What time? 0 points 0 points 0 points  Count back from 20 0 points 0 points 0 points  Months in reverse 0 points 0 points 0 points  Repeat phrase 0 points 0 points 2 points  Total Score 0 points 0 points 2 points    Immunizations Immunization History  Administered Date(s) Administered  Fluad Quad(high Dose 65+) 11/12/2020, 09/06/2021, 08/29/2022   Influenza, High Dose Seasonal PF 08/28/2023   Influenza,inj,Quad PF,6+ Mos 09/04/2015, 10/03/2016, 01/22/2018, 08/31/2018, 09/09/2019   Influenza-Unspecified 09/01/2014   PFIZER Comirnaty(Gray Top)Covid-19 Tri-Sucrose Vaccine 08/29/2022   PFIZER(Purple Top)SARS-COV-2 Vaccination 01/31/2020, 02/21/2020, 09/10/2020, 05/10/2021   PNEUMOCOCCAL  CONJUGATE-20 08/28/2023   Pneumococcal Conjugate-13 05/29/2020   Pneumococcal Polysaccharide-23 05/12/2011, 12/06/2021   Pneumococcal-Unspecified 05/12/2011   Respiratory Syncytial Virus Vaccine,Recomb Aduvanted(Arexvy) 12/26/2022   Td 10/10/2008, 01/22/2018   Zoster Recombinant(Shingrix) 05/31/2021, 09/06/2021   Zoster, Live 12/11/2015    TDAP status: Up to date  Flu Vaccine status: Up to date  Pneumococcal vaccine status: Up to date  Covid-19 vaccine status: Declined, Education has been provided regarding the importance of this vaccine but patient still declined. Advised may receive this vaccine at local pharmacy or Health Dept.or vaccine clinic. Aware to provide a copy of the vaccination record if obtained from local pharmacy or Health Dept. Verbalized acceptance and understanding.  Qualifies for Shingles Vaccine? Yes   Zostavax completed Yes   Shingrix Completed?: Yes  Screening Tests Health Maintenance  Topic Date Due   COVID-19 Vaccine (6 - 2024-25 season) 08/09/2023   MAMMOGRAM  09/14/2023   Diabetic kidney evaluation - Urine ACR  12/27/2023   OPHTHALMOLOGY EXAM  12/20/2023   Colonoscopy  06/25/2024 (Originally 02/06/2018)   FOOT EXAM  03/26/2024   HEMOGLOBIN A1C  04/01/2024   Diabetic kidney evaluation - eGFR measurement  10/01/2024   Medicare Annual Wellness (AWV)  12/28/2024   DTaP/Tdap/Td (3 - Tdap) 01/23/2028   DEXA SCAN  09/14/2031   Pneumonia Vaccine 55+ Years old  Completed   INFLUENZA VACCINE  Completed   Hepatitis C Screening  Completed   Zoster Vaccines- Shingrix  Completed   HPV VACCINES  Aged Out    Health Maintenance  Health Maintenance Due  Topic Date Due   COVID-19 Vaccine (6 - 2024-25 season) 08/09/2023   MAMMOGRAM  09/14/2023   Diabetic kidney evaluation - Urine ACR  12/27/2023   OPHTHALMOLOGY EXAM  12/20/2023    Colorectal cancer screening: Declined colonoscopy  Mammogram status: Completed 10/16/23. Repeat every year  Bone Density  status: Completed 09/13/21. Results reflect: Bone density results: NORMAL. Repeat every 10 years.  Lung Cancer Screening: (Low Dose CT Chest recommended if Age 4-80 years, 20 pack-year currently smoking OR have quit w/in 15years.) does not qualify.   Lung Cancer Screening Referral: n/a  Additional Screening:  Hepatitis C Screening: does not qualify; Completed 12/11/15  Vision Screening: Recommended annual ophthalmology exams for early detection of glaucoma and other disorders of the eye. Is the patient up to date with their annual eye exam?  No  Who is the provider or what is the name of the office in which the patient attends annual eye exams? Dr. Clearance Coots at [facility], [place] If pt is not established with a provider, would they like to be referred to a provider to establish care? No .   Dental Screening: Recommended annual dental exams for proper oral hygiene  Diabetic Foot Exam: Diabetic Foot Exam: Completed 03/27/23  Community Resource Referral / Chronic Care Management: CRR required this visit?  No   CCM required this visit?  No     Plan:     I have personally reviewed and noted the following in the patient's chart:   Medical and social history Use of alcohol, tobacco or illicit drugs  Current medications and supplements including opioid prescriptions. Patient is not currently taking opioid prescriptions.  Functional ability and status Nutritional status Physical activity Advanced directives List of other physicians Hospitalizations, surgeries, and ER visits in previous 12 months Vitals Screenings to include cognitive, depression, and falls Referrals and appointments  In addition, I have reviewed and discussed with patient certain preventive protocols, quality metrics, and best practice recommendations. A written personalized care plan for preventive services as well as general preventive health recommendations were provided to patient.     Tora Kindred,  CMA   12/29/2023   After Visit Summary: (MyChart) Due to this being a telephonic visit, the after visit summary with patients personalized plan was offered to patient via MyChart   Nurse Notes:  Declined DM & Nutrition education Declined covid vaccine Declined colonoscopy referral. Will discuss at next OV if candidate for cologuard. Needs diabetic eye exam. Patient will call and schedule appointment. Needs urine microalbumin Abstracted results of MMG from 10/16/23

## 2024-01-03 NOTE — Patient Instructions (Signed)
Be Involved in Caring For Your Health:  Taking Medications When medications are taken as directed, they can greatly improve your health. But if they are not taken as prescribed, they may not work. In some cases, not taking them correctly can be harmful. To help ensure your treatment remains effective and safe, understand your medications and how to take them. Bring your medications to each visit for review by your provider.  Your lab results, notes, and after visit summary will be available on My Chart. We strongly encourage you to use this feature. If lab results are abnormal the clinic will contact you with the appropriate steps. If the clinic does not contact you assume the results are satisfactory. You can always view your results on My Chart. If you have questions regarding your health or results, please contact the clinic during office hours. You can also ask questions on My Chart.  We at [facility] are grateful that you chose [place] to provide your care. We strive to provide evidence-based and compassionate care and are always looking for feedback. If you get a survey from the clinic please complete this so we can hear your opinions.  Diabetes Mellitus and Foot Care Diabetes, also called diabetes mellitus, may cause problems with your feet and legs because of poor blood flow (circulation). Poor circulation may make your skin: Become thinner and drier. Break more easily. Heal more slowly. Peel and crack. You may also have nerve damage (neuropathy). This can cause decreased feeling in your legs and feet. This means that you may not notice minor injuries to your feet that could lead to more serious problems. Finding and treating problems early is the best way to prevent future foot problems. How to care for your feet Foot hygiene  Wash your feet daily with warm water and mild soap. Do not use hot water. Then, pat your feet and the areas between your toes until they are fully dry. Do  not soak your feet. This can dry your skin. Trim your toenails straight across. Do not dig under them or around the cuticle. File the edges of your nails with an emery board or nail file. Apply a moisturizing lotion or petroleum jelly to the skin on your feet and to dry, brittle toenails. Use lotion that does not contain alcohol and is unscented. Do not apply lotion between your toes. Shoes and socks Wear clean socks or stockings every day. Make sure they are not too tight. Do not wear knee-high stockings. These may decrease blood flow to your legs. Wear shoes that fit well and have enough cushioning. Always look in your shoes before you put them on to be sure there are no objects inside. To break in new shoes, wear them for just a few hours a day. This prevents injuries on your feet. Wounds, scrapes, corns, and calluses  Check your feet daily for blisters, cuts, bruises, sores, and redness. If you cannot see the bottom of your feet, use a mirror or ask someone for help. Do not cut off corns or calluses or try to remove them with medicine. If you find a minor scrape, cut, or break in the skin on your feet, keep it and the skin around it clean and dry. You may clean these areas with mild soap and water. Do not clean the area with peroxide, alcohol, or iodine. If you have a wound, scrape, corn, or callus on your foot, look at it several times a day to make sure it  is healing and not infected. Check for: Redness, swelling, or pain. Fluid or blood. Warmth. Pus or a bad smell. General tips Do not cross your legs. This may decrease blood flow to your feet. Do not use heating pads or hot water bottles on your feet. They may burn your skin. If you have lost feeling in your feet or legs, you may not know this is happening until it is too late. Protect your feet from hot and cold by wearing shoes, such as at the beach or on hot pavement. Schedule a complete foot exam at least once a year or more often if  you have foot problems. Report any cuts, sores, or bruises to your health care provider right away. Where to find more information American Diabetes Association: diabetes.org Association of Diabetes Care & Education Specialists: diabeteseducator.org Contact a health care provider if: You have a condition that increases your risk of infection, and you have any cuts, sores, or bruises on your feet. You have an injury that is not healing. You have redness on your legs or feet. You feel burning or tingling in your legs or feet. You have pain or cramps in your legs and feet. Your legs or feet are numb. Your feet always feel cold. You have pain around any toenails. Get help right away if: You have a wound, scrape, corn, or callus on your foot and: You have signs of infection. You have a fever. You have a red line going up your leg. This information is not intended to replace advice given to you by your health care provider. Make sure you discuss any questions you have with your health care provider. Document Revised: 05/28/2022 Document Reviewed: 05/28/2022 Elsevier Patient Education  2024 [employer].

## 2024-01-08 ENCOUNTER — Encounter: Payer: Self-pay | Admitting: Nurse Practitioner

## 2024-01-08 ENCOUNTER — Ambulatory Visit (INDEPENDENT_AMBULATORY_CARE_PROVIDER_SITE_OTHER): Payer: Medicare Other | Admitting: Nurse Practitioner

## 2024-01-08 VITALS — BP 113/72 | HR 80 | Temp 98.0°F | Ht 61.0 in | Wt 162.6 lb

## 2024-01-08 DIAGNOSIS — I152 Hypertension secondary to endocrine disorders: Secondary | ICD-10-CM

## 2024-01-08 DIAGNOSIS — E785 Hyperlipidemia, unspecified: Secondary | ICD-10-CM

## 2024-01-08 DIAGNOSIS — E1159 Type 2 diabetes mellitus with other circulatory complications: Secondary | ICD-10-CM

## 2024-01-08 DIAGNOSIS — E1165 Type 2 diabetes mellitus with hyperglycemia: Secondary | ICD-10-CM

## 2024-01-08 DIAGNOSIS — E876 Hypokalemia: Secondary | ICD-10-CM

## 2024-01-08 DIAGNOSIS — E6609 Other obesity due to excess calories: Secondary | ICD-10-CM

## 2024-01-08 DIAGNOSIS — E66811 Obesity, class 1: Secondary | ICD-10-CM

## 2024-01-08 DIAGNOSIS — R011 Cardiac murmur, unspecified: Secondary | ICD-10-CM

## 2024-01-08 DIAGNOSIS — K219 Gastro-esophageal reflux disease without esophagitis: Secondary | ICD-10-CM | POA: Diagnosis not present

## 2024-01-08 DIAGNOSIS — E1169 Type 2 diabetes mellitus with other specified complication: Secondary | ICD-10-CM

## 2024-01-08 DIAGNOSIS — Z6830 Body mass index (BMI) 30.0-30.9, adult: Secondary | ICD-10-CM

## 2024-01-08 LAB — MICROALBUMIN, URINE WAIVED
Creatinine, Urine Waived: 100 mg/dL (ref 10–300)
Microalb, Ur Waived: 30 mg/L — ABNORMAL HIGH (ref 0–19)

## 2024-01-08 LAB — BAYER DCA HB A1C WAIVED: HB A1C (BAYER DCA - WAIVED): 7.3 % — ABNORMAL HIGH (ref 4.8–5.6)

## 2024-01-08 NOTE — Assessment & Plan Note (Addendum)
Chronic, ongoing with A1c 7.3% today and urine ALB 30 in January 2025, continue Olmesartan for kidney protection. - At this time continue Ozempic, she is at max dose, gets via assistance and Farxiga 10 MG daily (which she just receives via assistance). - Increase Glipizide to 10 MG every morning, which is cost effective and may help lower levels further.  Educated her on hypoglycemia risk with this.  She will monitor and alert provider if BS consistently <70.  Could consider Mounjaro in future and stopping Ozempic if assistance program available in future. - Avoid Metformin as did not tolerate in past.   - Recommend continue to check BS daily and return to regular exercise and diet focus to maintain A1C <7.   - Eye and foot exams up to date.  Pneumococcal and flu vaccines up to date. Return in 3 months.

## 2024-01-08 NOTE — Assessment & Plan Note (Signed)
No symptoms, present for years.  Will consider echo if symptoms present -- discussed with patient. 

## 2024-01-08 NOTE — Progress Notes (Signed)
BP 113/72   Pulse 80   Temp 98 F (36.7 C) (Oral)   Ht 5\' 1"  (1.549 m)   Wt 162 lb 9.6 oz (73.8 kg)   LMP  (LMP Unknown)   SpO2 98%   BMI 30.72 kg/m    Subjective:    Patient ID: Christine Booth, female    DOB: 06/24/1956, 68 y.o.   MRN: [id number]  HPI: Christine Booth is a 68 y.o. female  Chief Complaint  Patient presents with   Diabetes   Hypertension   Hyperlipidemia   DIABETES A1c 7.4% October.  Continues Farxiga 10 MG daily (assistance), Ozempic 2 MG weekly (assistance), Glipizide 7.5 MG daily.   Focusing on diet changes.  Is taking medications consistently.  Has been exercising regularly.   Metformin initially, caused some severe constipation.   Hypoglycemic episodes:no Polydipsia/polyuria: no Visual disturbance: no Chest pain: no Paresthesias: no Glucose Monitoring: yes             Accucheck frequency: occasionally             Fasting glucose: 140 range             Post prandial:             Evening:             Before meals: Taking Insulin?: no             Long acting insulin:             Short acting insulin: Blood Pressure Monitoring: daily Retinal Examination: Not Up To Date - Dr. Katherina Mires [place] Foot Exam: Up to Date Pneumovax: Up to Date Influenza: Up to Date Aspirin: yes    HYPERTENSION / HYPERLIPIDEMIA Continues Olmesartan 40 MG daily, Amlodipine 2.5 MG daily, and Rosuvastatin 40 MG daily.  History of low K+, but this has been stable for some time.  Continues on Protonix for GERD with benefit. Satisfied with current treatment? yes Duration of hypertension: chronic BP monitoring frequency: rarely BP range: 110/70 range BP medication side effects: no Duration of hyperlipidemia: chronic Cholesterol medication side effects: no Cholesterol supplements: none Medication compliance: good compliance Aspirin: yes Recent stressors: no Recurrent headaches: none Visual changes: no Palpitations: no Dyspnea: no Chest pain: no Lower  extremity edema: no Dizzy/lightheaded: had one day last week, standing up reaching up at work, lasted a second and went away  Relevant past medical, surgical, family and social history reviewed and updated as indicated. Interim medical history since our last visit reviewed. Allergies and medications reviewed and updated.  Review of Systems  Constitutional:  Negative for activity change, appetite change, diaphoresis, fatigue and fever.  Respiratory:  Negative for cough, chest tightness and shortness of breath.   Cardiovascular:  Negative for chest pain, palpitations and leg swelling.  Gastrointestinal: Negative.   Endocrine: Negative for polydipsia, polyphagia and polyuria.  Neurological: Negative.   Psychiatric/Behavioral: Negative.     Per HPI unless specifically indicated above     Objective:    BP 113/72   Pulse 80   Temp 98 F (36.7 C) (Oral)   Ht 5\' 1"  (1.549 m)   Wt 162 lb 9.6 oz (73.8 kg)   LMP  (LMP Unknown)   SpO2 98%   BMI 30.72 kg/m   Wt Readings from Last 3 Encounters:  01/08/24 162 lb 9.6 oz (73.8 kg)  12/29/23 160 lb (72.6 kg)  10/02/23 166 lb 3.2 oz (75.4 kg)    Physical Exam  Vitals and nursing note reviewed.  Constitutional:      General: She is awake. She is not in acute distress.    Appearance: She is well-developed, well-groomed and overweight. She is not ill-appearing.  HENT:     Head: Normocephalic.     Right Ear: Hearing, ear canal and external ear normal. No drainage.     Left Ear: Hearing, ear canal and external ear normal. No drainage.     Ears:     Comments:      Mouth/Throat:     Mouth: Mucous membranes are moist.  Eyes:     General: Lids are normal.        Right eye: No discharge.        Left eye: No discharge.     Conjunctiva/sclera: Conjunctivae normal.     Pupils: Pupils are equal, round, and reactive to light.  Neck:     Thyroid: No thyromegaly.     Vascular: No carotid bruit.  Cardiovascular:     Rate and Rhythm: Normal rate  and regular rhythm.     Heart sounds: Murmur heard.     Systolic murmur is present with a grade of 2/6.     No gallop.  Pulmonary:     Effort: Pulmonary effort is normal. No accessory muscle usage or respiratory distress.     Breath sounds: Normal breath sounds.  Abdominal:     General: Bowel sounds are normal.     Palpations: Abdomen is soft.  Musculoskeletal:     Cervical back: Normal range of motion and neck supple.     Right lower leg: No edema.     Left lower leg: No edema.  Skin:    General: Skin is warm and dry.  Neurological:     Mental Status: She is alert and oriented to person, place, and time.  Psychiatric:        Attention and Perception: Attention normal.        Mood and Affect: Mood normal.        Behavior: Behavior normal. Behavior is cooperative.        Thought Content: Thought content normal.        Judgment: Judgment normal.    Results for orders placed or performed in visit on 12/29/23  HM MAMMOGRAPHY   Collection Time: 10/16/23 12:00 AM  Result Value Ref Range   HM Mammogram 0-4 Bi-Rad 0-4 Bi-Rad, Self Reported Normal      Assessment & Plan:   Problem List Items Addressed This Visit       Cardiovascular and Mediastinum   Hypertension associated with diabetes (HCC)   Chronic, stable.  BP remains well at goal in office.  Continue Olmesartan and Amlodipine at current dosing and adjust as needed.  LABS: CBC, TSH, CMP.  Continue to monitor BP at home regularly and focus on DASH diet + regular exercise.        Relevant Orders   Bayer DCA Hb A1c Waived   Microalbumin, Urine Waived   CBC with Differential/Platelet   Comprehensive metabolic panel   TSH     Digestive   GERD (gastroesophageal reflux disease)   Chronic, stable at this time.  Risks of PPI use were discussed with patient including bone loss, C. Diff diarrhea, pneumonia, infections, CKD, electrolyte abnormalities. Verbalizes understanding and chooses to continue the medication. Mag level  annually.       Relevant Orders   Magnesium     Endocrine   Hyperlipidemia associated with  type 2 diabetes mellitus (HCC)   Chronic, stable. Continue current medication regimen and adjust as needed.  Lipid panel today.      Relevant Orders   Bayer DCA Hb A1c Waived   Comprehensive metabolic panel   Lipid Panel w/o Chol/HDL Ratio   Type 2 diabetes mellitus with hyperglycemia (HCC) - Primary   Chronic, ongoing with A1c 7.3% today and urine ALB 30 in January 2025, continue Olmesartan for kidney protection. - At this time continue Ozempic, she is at max dose, gets via assistance and Farxiga 10 MG daily (which she just receives via assistance). - Increase Glipizide to 10 MG every morning, which is cost effective and may help lower levels further.  Educated her on hypoglycemia risk with this.  She will monitor and alert provider if BS consistently <70.  Could consider Mounjaro in future and stopping Ozempic if assistance program available in future. - Avoid Metformin as did not tolerate in past.   - Recommend continue to check BS daily and return to regular exercise and diet focus to maintain A1C <7.   - Eye and foot exams up to date.  Pneumococcal and flu vaccines up to date. Return in 3 months.      Relevant Orders   Bayer DCA Hb A1c Waived   Microalbumin, Urine Waived   Comprehensive metabolic panel     Other   Hypokalemia   Recheck level today, recent 3.5, stable and she is adding to diet.  If remains stable over next couple checks will discontinue this diagnosis.      Relevant Orders   Comprehensive metabolic panel   Obesity   BMI 31.72 is working on exercise regimen and diet.  Recommended eating smaller high protein, low fat meals more frequently and exercising 30 mins a day 5 times a week with a goal of 10-15lb weight loss in the next 3 months. Patient voiced their understanding and motivation to adhere to these recommendations.       Systolic murmur   No symptoms,  present for years.  Will consider echo if symptoms present -- discussed with patient.        Follow up plan: Return in about 3 months (around 04/06/2024) for T2DM, HTN/HLD.

## 2024-01-08 NOTE — Assessment & Plan Note (Signed)
 Chronic, stable.  Continue current medication regimen and adjust as needed.  Lipid panel today.

## 2024-01-08 NOTE — Assessment & Plan Note (Signed)
BMI 31.72 is working on exercise regimen and diet.  Recommended eating smaller high protein, low fat meals more frequently and exercising 30 mins a day 5 times a week with a goal of 10-15lb weight loss in the next 3 months. Patient voiced their understanding and motivation to adhere to these recommendations.

## 2024-01-08 NOTE — Assessment & Plan Note (Signed)
Recheck level today, recent 3.5, stable and she is adding to diet.  If remains stable over next couple checks will discontinue this diagnosis.

## 2024-01-08 NOTE — Assessment & Plan Note (Signed)
Chronic, stable.  BP remains well at goal in office.  Continue Olmesartan and Amlodipine at current dosing and adjust as needed.  LABS: CBC, TSH, CMP.  Continue to monitor BP at home regularly and focus on DASH diet + regular exercise.

## 2024-01-08 NOTE — Assessment & Plan Note (Signed)
Chronic, stable at this time.  Risks of PPI use were discussed with patient including bone loss, C. Diff diarrhea, pneumonia, infections, CKD, electrolyte abnormalities. Verbalizes understanding and chooses to continue the medication. Mag level annually.  

## 2024-01-09 ENCOUNTER — Other Ambulatory Visit: Payer: Self-pay | Admitting: Nurse Practitioner

## 2024-01-09 LAB — TSH: TSH: 1.86 u[IU]/mL (ref 0.450–4.500)

## 2024-01-09 LAB — LIPID PANEL W/O CHOL/HDL RATIO
Cholesterol, Total: 105 mg/dL (ref 100–199)
HDL: 50 mg/dL (ref >39)
LDL Chol Calc (NIH): 38 mg/dL (ref 0–99)
Triglycerides: 88 mg/dL (ref 0–149)
VLDL Cholesterol Cal: 17 mg/dL (ref 5–40)

## 2024-01-09 LAB — CBC WITH DIFFERENTIAL/PLATELET
Basophils Absolute: 0.1 10*3/uL (ref 0.0–0.2)
Basos: 1 %
EOS (ABSOLUTE): 0.2 10*3/uL (ref 0.0–0.4)
Eos: 3 %
Hematocrit: 45.1 % (ref 34.0–46.6)
Hemoglobin: 14.7 g/dL (ref 11.1–15.9)
Immature Grans (Abs): 0 10*3/uL (ref 0.0–0.1)
Immature Granulocytes: 0 %
Lymphocytes Absolute: 2.7 10*3/uL (ref 0.7–3.1)
Lymphs: 42 %
MCH: 31.1 pg (ref 26.6–33.0)
MCHC: 32.6 g/dL (ref 31.5–35.7)
MCV: 95 fL (ref 79–97)
Monocytes Absolute: 0.4 10*3/uL (ref 0.1–0.9)
Monocytes: 6 %
Neutrophils Absolute: 3.2 10*3/uL (ref 1.4–7.0)
Neutrophils: 48 %
Platelets: 223 10*3/uL (ref 150–450)
RBC: 4.73 x10E6/uL (ref 3.77–5.28)
RDW: 12.6 % (ref 11.7–15.4)
WBC: 6.6 10*3/uL (ref 3.4–10.8)

## 2024-01-09 LAB — COMPREHENSIVE METABOLIC PANEL
ALT: 17 [IU]/L (ref 0–32)
AST: 22 [IU]/L (ref 0–40)
Albumin: 4.4 g/dL (ref 3.9–4.9)
Alkaline Phosphatase: 55 [IU]/L (ref 44–121)
BUN/Creatinine Ratio: 18 (ref 12–28)
BUN: 10 mg/dL (ref 8–27)
Bilirubin Total: 0.5 mg/dL (ref 0.0–1.2)
CO2: 21 mmol/L (ref 20–29)
Calcium: 9.4 mg/dL (ref 8.7–10.3)
Chloride: 105 mmol/L (ref 96–106)
Creatinine, Ser: 0.57 mg/dL (ref 0.57–1.00)
Globulin, Total: 2 g/dL (ref 1.5–4.5)
Glucose: 164 mg/dL — ABNORMAL HIGH (ref 70–99)
Potassium: 3.9 mmol/L (ref 3.5–5.2)
Sodium: 140 mmol/L (ref 134–144)
Total Protein: 6.4 g/dL (ref 6.0–8.5)
eGFR: 98 mL/min/{1.73_m2} (ref >59)

## 2024-01-09 LAB — MAGNESIUM: Magnesium: 2 mg/dL (ref 1.6–2.3)

## 2024-01-10 ENCOUNTER — Encounter: Payer: Self-pay | Admitting: Nurse Practitioner

## 2024-01-10 NOTE — Progress Notes (Signed)
Contacted via MyChart   Good morning Christine Booth, your labs have returned and overall remain stable.  Kidney function, creatinine and eGFR, remains normal, as is liver function, AST and ALT.  Continue all current medications.  Any questions? Keep being stellar!!  Thank you for allowing me to participate in your care.  I appreciate you. Kindest regards, Melessa Cowell

## 2024-01-11 NOTE — Telephone Encounter (Signed)
Requested Prescriptions  Pending Prescriptions Disp Refills   fluticasone (FLONASE) 50 MCG/ACT nasal spray [Pharmacy Med Name: FLUTICASONE PROP 50 MCG SPRAY] 48 mL 1    Sig: SPRAY 2 SPRAYS INTO EACH NOSTRIL EVERY DAY     Ear, Nose, and Throat: Nasal Preparations - Corticosteroids Passed - 01/11/2024  9:23 AM      Passed - Valid encounter within last 12 months    Recent Outpatient Visits           3 days ago Type 2 diabetes mellitus with hyperglycemia, without long-term current use of insulin (HCC)   [place] [facility] [place], [place] T, NP   3 months ago Type 2 diabetes mellitus with hyperglycemia, without long-term current use of insulin (HCC)   [facility] [place] [facility] [place], [place] T, NP   6 months ago Type 2 diabetes mellitus with hyperglycemia, without long-term current use of insulin (HCC)   [place] [facility] [place], [place] T, NP   8 months ago Type 2 diabetes mellitus with hyperglycemia, without long-term current use of insulin (HCC)   [place] [facility] [place], [place] T, NP   9 months ago Type 2 diabetes mellitus with hyperglycemia, without long-term current use of insulin (HCC)    [facility] [place], Dorie Rank, NP

## 2024-01-28 ENCOUNTER — Other Ambulatory Visit: Payer: Medicare Other

## 2024-01-29 ENCOUNTER — Other Ambulatory Visit: Payer: Medicare Other

## 2024-02-04 ENCOUNTER — Telehealth: Payer: Self-pay

## 2024-02-04 NOTE — Telephone Encounter (Signed)
 Patient is aware her Ozempic from [employer] patient assistance is received and ready for pick up.

## 2024-02-05 ENCOUNTER — Other Ambulatory Visit: Payer: Self-pay

## 2024-02-05 MED ORDER — GLIPIZIDE 10 MG PO TABS: 10.0000 mg | ORAL_TABLET | Freq: Every day | ORAL | 3 refills | Status: DC

## 2024-02-05 NOTE — Progress Notes (Signed)
   02/05/2024  Patient ID: Christine Booth, female   DOB: 07-14-1956, 68 y.o.   MRN: [id number]  Subjective/Objective Telephone visit to follow-up on management of T2DM  Diabetes Management Plan -Current medications:  Farxiga 10mg  daily, glipizide 10mg  daily with breakfast, Ozempic 2mg  weekly -Ozempic through [employer] PAP-enrollment good through 12/07/24 -Farxiga initially denied for 2025 [place] PAP (in November), but then PCP received request for refill; and patient has received 90 day supply from program -Glipizide recently increased to 10mg  daily- patient take 2 tablets of 5mg  on hand -A1c 1/31 was 7.3% -Patient endorses FBG 130-160 -Does not endorse any s/sx of hypoglycemia  Assessment/Plan  Diabetes Management Plan -Currently uncontrolled with A1c >7% -Pending prescription for glipizide 10mg  daily, so patient can just take 1 tablet -Recommend lifestyle modifications for improved glycemic control  Follow-up:  6 months  [name], PharmD, DPLA

## 2024-02-19 DIAGNOSIS — E119 Type 2 diabetes mellitus without complications: Secondary | ICD-10-CM | POA: Diagnosis not present

## 2024-02-19 LAB — HM DIABETES EYE EXAM

## 2024-03-19 ENCOUNTER — Other Ambulatory Visit: Payer: Self-pay | Admitting: Nurse Practitioner

## 2024-03-19 DIAGNOSIS — E1159 Type 2 diabetes mellitus with other circulatory complications: Secondary | ICD-10-CM

## 2024-03-19 DIAGNOSIS — E1165 Type 2 diabetes mellitus with hyperglycemia: Secondary | ICD-10-CM

## 2024-03-21 NOTE — Telephone Encounter (Signed)
 Last OV 12/23/23 within protocol.  Requested Prescriptions  Pending Prescriptions Disp Refills   olmesartan (BENICAR) 40 MG tablet [Pharmacy Med Name: OLMESARTAN MEDOXOMIL 40 MG TAB] 90 tablet 0    Sig: TAKE 1 TABLET BY MOUTH EVERY DAY     Cardiovascular:  Angiotensin Receptor Blockers Failed - 03/21/2024 11:38 AM      Failed - Valid encounter within last 6 months    Recent Outpatient Visits   None     Future Appointments             In 1 week Cannady, Lavelle Posey, NP [place] [facility]            Passed - Cr in normal range and within 180 days    Creatinine, Ser  Date Value Ref Range Status  01/08/2024 0.57 0.57 - 1.00 mg/dL Final         Passed - K in normal range and within 180 days    Potassium  Date Value Ref Range Status  01/08/2024 3.9 3.5 - 5.2 mmol/L Final         Passed - Patient is not pregnant      Passed - Last BP in normal range    BP Readings from Last 1 Encounters:  01/08/24 113/72

## 2024-03-27 NOTE — Patient Instructions (Signed)
Be Involved in Caring For Your Health:  Taking Medications When medications are taken as directed, they can greatly improve your health. But if they are not taken as prescribed, they may not work. In some cases, not taking them correctly can be harmful. To help ensure your treatment remains effective and safe, understand your medications and how to take them. Bring your medications to each visit for review by your provider.  Your lab results, notes, and after visit summary will be available on My Chart. We strongly encourage you to use this feature. If lab results are abnormal the clinic will contact you with the appropriate steps. If the clinic does not contact you assume the results are satisfactory. You can always view your results on My Chart. If you have questions regarding your health or results, please contact the clinic during office hours. You can also ask questions on My Chart.  We at [name] are grateful that you chose [place] to provide your care. We strive to provide evidence-based and compassionate care and are always looking for feedback. If you get a survey from the clinic please complete this so we can hear your opinions.  Diabetes Mellitus and Exercise Regular exercise is important for your health, especially if you have diabetes mellitus. Exercise is not just about losing weight. It can also help you increase muscle strength and bone density and reduce body fat and stress. This can help your level of endurance and make you more fit and flexible. Why should I exercise if I have diabetes? Exercise has many benefits for people with diabetes. It can: Help lower and control your blood sugar (glucose). Help your body respond better and become more sensitive to the hormone insulin. Reduce how much insulin your body needs. Lower your risk for heart disease by: Lowering how much "bad" cholesterol and triglycerides you have in your body. Increasing how much "good" cholesterol  you have in your body. Lowering your blood pressure. Lowering your blood glucose levels. What is my activity plan? Your health care provider or an expert trained in diabetes care (certified diabetes educator) can help you make an activity plan. This plan can help you find the type of exercise that works for you. It may also tell you how often to exercise and for how long. Be sure to: Get at least 150 minutes of medium-intensity or high-intensity exercise each week. This may involve brisk walking, biking, or water aerobics. Do stretching and strengthening exercises at least 2 times a week. This may involve yoga or weight lifting. Spread out your activity over at least 3 days of the week. Get some form of physical activity each day. Do not go more than 2 days in a row without some kind of activity. Avoid being inactive for more than 30 minutes at a time. Take frequent breaks to walk or stretch. Choose activities that you enjoy. Set goals that you know you can accomplish. Start slowly and increase the intensity of your exercise over time. How do I manage my diabetes during exercise?  Monitor your blood glucose Check your blood glucose before and after you exercise. If your blood glucose is 240 mg/dL (40.9 mmol/L) or higher before you exercise, check your urine for ketones. These are chemicals created by the liver. If you have ketones in your urine, do not exercise until your blood glucose returns to normal. If your blood glucose is 100 mg/dL (5.6 mmol/L) or lower, eat a snack that has 15-20 grams of carbohydrate in  it. Check your blood glucose 15 minutes after the snack to make sure that your level is above 100 mg/dL (5.6 mmol/L) before you start to exercise. Your risk for low blood glucose (hypoglycemia) goes up during and after exercise. Know the symptoms of this condition and how to treat it. Follow these instructions at home: Keep a carbohydrate snack on hand for use before, during, and after  exercise. This can help prevent or treat hypoglycemia. Avoid injecting insulin into parts of your body that are going to be used during exercise. This may include: Your arms, when you are going to play tennis. Your legs, when you are about to go jogging. Keep track of your exercise habits. This can help you and your health care provider watch and adjust your activity plan. Write down: What you eat before and after you exercise. Blood glucose levels before and after you exercise. The type and amount of exercise you do. Talk to your health care provider before you start a new activity. They may need to: Make sure that the activity is safe for you. Adjust your insulin, other medicines, and food that you eat. Drink water while you exercise. This can stop you from losing too much water (dehydration). It can also prevent problems caused by having a lot of heat in your body (heat stroke). Where to find more information American Diabetes Association: diabetes.org Association of Diabetes Care & Education Specialists: diabeteseducator.org This information is not intended to replace advice given to you by your health care provider. Make sure you discuss any questions you have with your health care provider. Document Revised: 05/14/2022 Document Reviewed: 05/14/2022 Elsevier Patient Education  2024 [employer].

## 2024-04-01 ENCOUNTER — Encounter: Payer: Self-pay | Admitting: Nurse Practitioner

## 2024-04-01 ENCOUNTER — Ambulatory Visit (INDEPENDENT_AMBULATORY_CARE_PROVIDER_SITE_OTHER): Payer: Medicare Other | Admitting: Nurse Practitioner

## 2024-04-01 VITALS — BP 134/80 | HR 84 | Temp 98.7°F | Wt 163.6 lb

## 2024-04-01 DIAGNOSIS — E1165 Type 2 diabetes mellitus with hyperglycemia: Secondary | ICD-10-CM

## 2024-04-01 DIAGNOSIS — E785 Hyperlipidemia, unspecified: Secondary | ICD-10-CM

## 2024-04-01 DIAGNOSIS — Z7985 Long-term (current) use of injectable non-insulin antidiabetic drugs: Secondary | ICD-10-CM | POA: Diagnosis not present

## 2024-04-01 DIAGNOSIS — E1159 Type 2 diabetes mellitus with other circulatory complications: Secondary | ICD-10-CM | POA: Diagnosis not present

## 2024-04-01 DIAGNOSIS — E1169 Type 2 diabetes mellitus with other specified complication: Secondary | ICD-10-CM | POA: Diagnosis not present

## 2024-04-01 DIAGNOSIS — I152 Hypertension secondary to endocrine disorders: Secondary | ICD-10-CM | POA: Diagnosis not present

## 2024-04-01 DIAGNOSIS — E6609 Other obesity due to excess calories: Secondary | ICD-10-CM

## 2024-04-01 DIAGNOSIS — E66811 Obesity, class 1: Secondary | ICD-10-CM

## 2024-04-01 DIAGNOSIS — R011 Cardiac murmur, unspecified: Secondary | ICD-10-CM | POA: Diagnosis not present

## 2024-04-01 DIAGNOSIS — Z6830 Body mass index (BMI) 30.0-30.9, adult: Secondary | ICD-10-CM

## 2024-04-01 DIAGNOSIS — E876 Hypokalemia: Secondary | ICD-10-CM | POA: Diagnosis not present

## 2024-04-01 LAB — BAYER DCA HB A1C WAIVED: HB A1C (BAYER DCA - WAIVED): 6.9 % — ABNORMAL HIGH (ref 4.8–5.6)

## 2024-04-01 NOTE — Assessment & Plan Note (Signed)
 Chronic, ongoing with A1c 6.9% today, trend down, and urine ALB 30 in January 2025, continue Olmesartan for kidney protection. - At this time continue Ozempic , she is at max dose, gets via assistance and Farxiga  10 MG daily (which she just receives via assistance). - Continue Glipizide  10 MG every morning, which is cost effective and may help lower levels further.  Educated her on hypoglycemia risk with this.  She will monitor and alert provider if BS consistently <70.  Could consider Mounjaro in future and stopping Ozempic  if assistance program available in future. - Avoid Metformin  as did not tolerate in past.   - Recommend continue to check BS daily and return to regular exercise and diet focus to maintain A1C <7.   - Eye and foot exams up to date.  Pneumococcal and flu vaccines up to date. Return in 3 months.

## 2024-04-01 NOTE — Assessment & Plan Note (Signed)
 Chronic, stable.  Continue current medication regimen and adjust as needed.  Lipid panel today.

## 2024-04-01 NOTE — Assessment & Plan Note (Signed)
 BMI 30.91 is working on exercise regimen and diet.  Recommended eating smaller high protein, low fat meals more frequently and exercising 30 mins a day 5 times a week with a goal of 10-15lb weight loss in the next 3 months. Patient voiced their understanding and motivation to adhere to these recommendations.

## 2024-04-01 NOTE — Assessment & Plan Note (Signed)
 Chronic, stable.  BP remains at goal in office.  Continue Olmesartan and Amlodipine  at current dosing and adjust as needed.  LABS: CMP.  Continue to monitor BP at home regularly and focus on DASH diet + regular exercise.

## 2024-04-01 NOTE — Assessment & Plan Note (Signed)
 Recheck level today, recent 3.9, stable and she is adding to diet.  Since remains stable will discontinue this diagnosis at this time.

## 2024-04-01 NOTE — Assessment & Plan Note (Signed)
No symptoms, present for years.  Will consider echo if symptoms present -- discussed with patient. 

## 2024-04-01 NOTE — Progress Notes (Signed)
 BP 134/80   Pulse 84   Temp 98.7 F (37.1 C) (Oral)   Wt 163 lb 9.6 oz (74.2 kg)   LMP  (LMP Unknown)   SpO2 98%   BMI 30.91 kg/m    Subjective:    Patient ID: Christine Booth, female    DOB: [birth year], 68 y.o.   MRN: [id number]  HPI: [name] is a 68 y.o. female  Chief Complaint  Patient presents with   Diabetes   Hyperlipidemia   Hypertension   DIABETES 7.3% A1c in January.  Taking  Farxiga  10 MG daily (assistance), Ozempic  2 MG weekly (assistance), Glipizide  10 MG daily.  Is taking medications consistently.  Has been exercising regularly.   Metformin  initially taken, caused severe constipation.   Hypoglycemic episodes:no Polydipsia/polyuria: no Visual disturbance: no Chest pain: no Paresthesias: no Glucose Monitoring: yes             Accucheck frequency: almost every day - 130 to 180             Fasting glucose:              Post prandial:             Evening:             Before meals: Taking Insulin?: no             Long acting insulin:             Short acting insulin: Blood Pressure Monitoring: daily Retinal Examination: Not Up To Date - Dr. Sterling Eisenmenger [place] Foot Exam: Up to Date Pneumovax: Up to Date Influenza: Up to Date Aspirin: yes    HYPERTENSION / HYPERLIPIDEMIA Takes Olmesartan 40 MG daily, Amlodipine  2.5 MG daily, and Rosuvastatin  40 MG daily.  History of low K+, but this has been remaining within normal range. Satisfied with current treatment? yes Duration of hypertension: chronic BP monitoring frequency: daily BP range: 110/70 range BP medication side effects: no Duration of hyperlipidemia: chronic Cholesterol medication side effects: no Cholesterol supplements: none Medication compliance: good compliance Aspirin: yes Recent stressors: no Recurrent headaches: none Visual changes: no Palpitations: no Dyspnea: no Chest pain: no Lower extremity edema: no Dizzy/lightheaded: had one day last week, standing up  reaching up at work, lasted a second and went away  BACK & LEG PAIN Been having left sided sciatic pain for 3-4 weeks. Duration: weeks Mechanism of injury: unknown Location: Left and low back Onset: gradual Severity: 3/10 Quality: dull, aching, and throbbing Frequency: intermittent Radiation: L leg below the knee Aggravating factors: lifting and movement, standing for long period of time, sitting for a long period of time Alleviating factors: rest and laying - getting up to move Status: worse Treatments attempted: rest, ice, heat, APAP, and ibuprofen  Relief with NSAIDs?: mild Nighttime pain:  no Paresthesias / decreased sensation:  no Bowel / bladder incontinence:  no Fevers:  no Dysuria / urinary frequency:  no   Relevant past medical, surgical, family and social history reviewed and updated as indicated. Interim medical history since our last visit reviewed. Allergies and medications reviewed and updated.  Review of Systems  Constitutional:  Negative for activity change, appetite change, diaphoresis, fatigue and fever.  Respiratory:  Negative for cough, chest tightness and shortness of breath.   Cardiovascular:  Negative for chest pain, palpitations and leg swelling.  Gastrointestinal: Negative.   Endocrine: Negative for polydipsia, polyphagia and polyuria.  Neurological: Negative.   Psychiatric/Behavioral: Negative.  Per HPI unless specifically indicated above     Objective:    BP 134/80   Pulse 84   Temp 98.7 F (37.1 C) (Oral)   Wt 163 lb 9.6 oz (74.2 kg)   LMP  (LMP Unknown)   SpO2 98%   BMI 30.91 kg/m   Wt Readings from Last 3 Encounters:  04/01/24 163 lb 9.6 oz (74.2 kg)  01/08/24 162 lb 9.6 oz (73.8 kg)  12/29/23 160 lb (72.6 kg)    Physical Exam Vitals and nursing note reviewed.  Constitutional:      General: She is awake. She is not in acute distress.    Appearance: She is well-developed, well-groomed and overweight. She is not ill-appearing.   HENT:     Head: Normocephalic.     Right Ear: Hearing, ear canal and external ear normal. No drainage.     Left Ear: Hearing, ear canal and external ear normal. No drainage.     Ears:     Comments:      Mouth/Throat:     Mouth: Mucous membranes are moist.  Eyes:     General: Lids are normal.        Right eye: No discharge.        Left eye: No discharge.     Conjunctiva/sclera: Conjunctivae normal.     Pupils: Pupils are equal, round, and reactive to light.  Neck:     Thyroid : No thyromegaly.     Vascular: No carotid bruit.  Cardiovascular:     Rate and Rhythm: Normal rate and regular rhythm.     Heart sounds: Murmur heard.     Systolic murmur is present with a grade of 2/6.     No gallop.  Pulmonary:     Effort: Pulmonary effort is normal. No accessory muscle usage or respiratory distress.     Breath sounds: Normal breath sounds.  Abdominal:     General: Bowel sounds are normal.     Palpations: Abdomen is soft.  Musculoskeletal:     Cervical back: Normal range of motion and neck supple.     Right lower leg: No edema.     Left lower leg: No edema.  Skin:    General: Skin is warm and dry.  Neurological:     Mental Status: She is alert and oriented to person, place, and time.  Psychiatric:        Attention and Perception: Attention normal.        Mood and Affect: Mood normal.        Behavior: Behavior normal. Behavior is cooperative.        Thought Content: Thought content normal.        Judgment: Judgment normal.    Results for orders placed or performed in visit on 02/22/24  [facility] DIABETES EYE EXAM   Collection Time: 02/19/24  7:37 AM  Result Value Ref Range   [facility] Diabetic Eye Exam No Retinopathy No Retinopathy      Assessment & Plan:   Problem List Items Addressed This Visit       Cardiovascular and Mediastinum   Hypertension associated with diabetes (HCC)   Chronic, stable.  BP remains at goal in office.  Continue Olmesartan and Amlodipine  at current dosing  and adjust as needed.  LABS: CMP.  Continue to monitor BP at home regularly and focus on DASH diet + regular exercise.        Relevant Orders   Bayer DCA Hb A1c Waived  Comprehensive metabolic panel with GFR     Endocrine   Type 2 diabetes mellitus with hyperglycemia (HCC) - Primary   Chronic, ongoing with A1c 6.9% today, trend down, and urine ALB 30 in January 2025, continue Olmesartan for kidney protection. - At this time continue Ozempic , she is at max dose, gets via assistance and Farxiga  10 MG daily (which she just receives via assistance). - Continue Glipizide  10 MG every morning, which is cost effective and may help lower levels further.  Educated her on hypoglycemia risk with this.  She will monitor and alert provider if BS consistently <70.  Could consider Mounjaro in future and stopping Ozempic  if assistance program available in future. - Avoid Metformin  as did not tolerate in past.   - Recommend continue to check BS daily and return to regular exercise and diet focus to maintain A1C <7.   - Eye and foot exams up to date.  Pneumococcal and flu vaccines up to date. Return in 3 months.      Relevant Orders   Bayer DCA Hb A1c Waived   Comprehensive metabolic panel with GFR   Hyperlipidemia associated with type 2 diabetes mellitus (HCC)   Chronic, stable. Continue current medication regimen and adjust as needed.  Lipid panel today.      Relevant Orders   Bayer DCA Hb A1c Waived   Comprehensive metabolic panel with GFR   Lipid Panel w/o Chol/HDL Ratio     Other   Systolic murmur   No symptoms, present for years.  Will consider echo if symptoms present -- discussed with patient.      Relevant Orders   Comprehensive metabolic panel with GFR   Lipid Panel w/o Chol/HDL Ratio   Obesity   BMI 40.98 is working on exercise regimen and diet.  Recommended eating smaller high protein, low fat meals more frequently and exercising 30 mins a day 5 times a week with a goal of 10-15lb  weight loss in the next 3 months. Patient voiced their understanding and motivation to adhere to these recommendations.       Hypokalemia   Recheck level today, recent 3.9, stable and she is adding to diet.  Since remains stable will discontinue this diagnosis at this time.      Relevant Orders   Comprehensive metabolic panel with GFR     Follow up plan: Return in about 3 months (around 07/01/2024) for T2DM, HTN/HLD.

## 2024-04-02 LAB — COMPREHENSIVE METABOLIC PANEL WITH GFR
ALT: 17 IU/L (ref 0–32)
AST: 21 IU/L (ref 0–40)
Albumin: 4.5 g/dL (ref 3.9–4.9)
Alkaline Phosphatase: 63 IU/L (ref 44–121)
BUN/Creatinine Ratio: 15 (ref 12–28)
BUN: 9 mg/dL (ref 8–27)
Bilirubin Total: 0.4 mg/dL (ref 0.0–1.2)
CO2: 17 mmol/L — ABNORMAL LOW (ref 20–29)
Calcium: 9.4 mg/dL (ref 8.7–10.3)
Chloride: 110 mmol/L — ABNORMAL HIGH (ref 96–106)
Creatinine, Ser: 0.61 mg/dL (ref 0.57–1.00)
Globulin, Total: 1.9 g/dL (ref 1.5–4.5)
Glucose: 148 mg/dL — ABNORMAL HIGH (ref 70–99)
Potassium: 3.7 mmol/L (ref 3.5–5.2)
Sodium: 144 mmol/L (ref 134–144)
Total Protein: 6.4 g/dL (ref 6.0–8.5)
eGFR: 97 mL/min/{1.73_m2} (ref >59)

## 2024-04-02 LAB — LIPID PANEL W/O CHOL/HDL RATIO
Cholesterol, Total: 118 mg/dL (ref 100–199)
HDL: 50 mg/dL (ref >39)
LDL Chol Calc (NIH): 43 mg/dL (ref 0–99)
Triglycerides: 145 mg/dL (ref 0–149)
VLDL Cholesterol Cal: 25 mg/dL (ref 5–40)

## 2024-04-03 ENCOUNTER — Encounter: Payer: Self-pay | Admitting: Nurse Practitioner

## 2024-04-03 NOTE — Progress Notes (Signed)
 Contacted via MyChart   Good morning Christine Booth, your labs have returned and overall remain stable.  No changes needed.  Any questions? Keep being amazing!!  Thank you for allowing me to participate in your care.  I appreciate you. Kindest regards, Saudia Smyser

## 2024-04-06 ENCOUNTER — Encounter: Payer: Self-pay | Admitting: Nurse Practitioner

## 2024-04-06 ENCOUNTER — Telehealth: Admitting: Physician Assistant

## 2024-04-06 DIAGNOSIS — L237 Allergic contact dermatitis due to plants, except food: Secondary | ICD-10-CM | POA: Diagnosis not present

## 2024-04-06 MED ORDER — PREDNISONE 10 MG PO TABS: ORAL_TABLET | ORAL | 0 refills | Status: AC

## 2024-04-06 MED ORDER — TRIAMCINOLONE ACETONIDE 0.1 % EX CREA
1.0000 | TOPICAL_CREAM | Freq: Two times a day (BID) | CUTANEOUS | 0 refills | Status: AC
Start: 2024-04-06 — End: ?

## 2024-04-06 NOTE — Progress Notes (Signed)
 Virtual Visit Consent   Christine Booth, you are scheduled for a virtual visit with a [facility] provider today. Just as with appointments in the office, your consent must be obtained to participate. Your consent will be active for this visit and any virtual visit you may have with one of our providers in the next 365 days. If you have a MyChart account, a copy of this consent can be sent to you electronically.  As this is a virtual visit, video technology does not allow for your provider to perform a traditional examination. This may limit your provider's ability to fully assess your condition. If your provider identifies any concerns that need to be evaluated in person or the need to arrange testing (such as labs, EKG, etc.), we will make arrangements to do so. Although advances in technology are sophisticated, we cannot ensure that it will always work on either your end or our end. If the connection with a video visit is poor, the visit may have to be switched to a telephone visit. With either a video or telephone visit, we are not always able to ensure that we have a secure connection.  By engaging in this virtual visit, you consent to the provision of healthcare and authorize for your insurance to be billed (if applicable) for the services provided during this visit. Depending on your insurance coverage, you may receive a charge related to this service.  I need to obtain your verbal consent now. Are you willing to proceed with your visit today? Christine Booth has provided verbal consent on 04/06/2024 for a virtual visit (video or telephone). Christine Booth, [place]  Date: 04/06/2024 6:27 PM   Virtual Visit via Video Note   I, Christine Booth, connected with  Christine Booth  ([id number], [birth year]) on 04/06/24 at  6:15 PM EDT by a video-enabled telemedicine application and verified that I am speaking with the correct person using two identifiers.  Location: Patient: Virtual  Visit Location Patient: Home Provider: Virtual Visit Location Provider: Home Office   I discussed the limitations of evaluation and management by telemedicine and the availability of in person appointments. The patient expressed understanding and agreed to proceed.    History of Present Illness: Christine Booth is a 68 y.o. who identifies as a female who was assigned female at birth, and is being seen today for concern for poison ivy dermatitis. Endorses on Sunday she was trimming bushes around the yard. The nest morning she noted pruritic blisters (itching) on her hands, then nose, face, chin and neck. Now spread to left side of abdomen.  Has applied OTC topical cortisone cream with only some relief. Rash continues to spread and she is worried about this.  HPI: HPI  Problems:  Patient Active Problem List   Diagnosis Date Noted   Systolic murmur 09/19/2022   GERD (gastroesophageal reflux disease) 05/04/2019   Obesity 05/04/2019   Benign neoplasm of ascending colon    Diverticulosis of large intestine without diverticulitis    First degree hemorrhoids    Hypertension associated with diabetes (HCC) 06/23/2016   Hyperlipidemia associated with type 2 diabetes mellitus (HCC) 06/04/2015   Type 2 diabetes mellitus with hyperglycemia (HCC) 06/04/2015    Allergies: No Known Allergies Medications:  Current Outpatient Medications:    predniSONE  (DELTASONE ) 10 MG tablet, Take 4 tablets (40 mg total) by mouth daily with breakfast for 4 days, THEN 3 tablets (30 mg total) daily with breakfast for 4 days, THEN 2  tablets (20 mg total) daily with breakfast for 3 days, THEN 1 tablet (10 mg total) daily with breakfast for 3 days., Disp: 37 tablet, Rfl: 0   triamcinolone  cream (KENALOG ) 0.1 %, Apply 1 Application topically 2 (two) times daily., Disp: 30 g, Rfl: 0   amLODipine  (NORVASC ) 2.5 MG tablet, Take 1 tablet (2.5 mg total) by mouth daily., Disp: 90 tablet, Rfl: 4   cetirizine (ZYRTEC) 10 MG tablet,  Take 10 mg by mouth daily., Disp: , Rfl:    dapagliflozin  propanediol (FARXIGA ) 10 MG TABS tablet, Take 1 tablet (10 mg total) by mouth daily., Disp: 90 tablet, Rfl: 1   fluticasone  (FLONASE ) 50 MCG/ACT nasal spray, SPRAY 2 SPRAYS INTO EACH NOSTRIL EVERY DAY, Disp: 48 mL, Rfl: 1   glipiZIDE  (GLUCOTROL ) 10 MG tablet, Take 1 tablet (10 mg total) by mouth daily before breakfast., Disp: 90 tablet, Rfl: 3   glucose blood test strip, Use as instructed, Disp: 100 each, Rfl: 12   olmesartan (BENICAR) 40 MG tablet, TAKE 1 TABLET BY MOUTH EVERY DAY, Disp: 90 tablet, Rfl: 0   pantoprazole  (PROTONIX ) 40 MG tablet, TAKE 1 TABLET BY MOUTH EVERY DAY, Disp: 90 tablet, Rfl: 3   rosuvastatin  (CRESTOR ) 40 MG tablet, TAKE 1 TABLET BY MOUTH EVERY DAY, Disp: 90 tablet, Rfl: 3   Semaglutide , 2 MG/DOSE, 8 MG/3ML SOPN, Inject 2 mg into the skin once a week., Disp: 9 mL, Rfl: 4   sennosides-docusate sodium (SENOKOT-S) 8.6-50 MG tablet, Take 1 tablet by mouth daily as needed for constipation., Disp: , Rfl:   Observations/Objective: Patient is well-developed, well-nourished in no acute distress.  Resting comfortably at home.  Head is normocephalic, atraumatic.  No labored breathing. Speech is clear and coherent with logical content.  Patient is alert and oriented at baseline.       Assessment and Plan: 1. Poison ivy dermatitis (Primary) - triamcinolone  cream (KENALOG ) 0.1 %; Apply 1 Application topically 2 (two) times daily.  Dispense: 30 g; Refill: 0 - predniSONE  (DELTASONE ) 10 MG tablet; Take 4 tablets (40 mg total) by mouth daily with breakfast for 4 days, THEN 3 tablets (30 mg total) daily with breakfast for 4 days, THEN 2 tablets (20 mg total) daily with breakfast for 3 days, THEN 1 tablet (10 mg total) daily with breakfast for 3 days.  Dispense: 37 tablet; Refill: 0  Supportive measures and OTC medications reviewed. Start prednisone  taper (Last A1C < 7) giving widespread rash and facial involvement. Topical  Triamcinolone  as discussed. If not resolving, or any new/worsening symptoms despite treatment, will need an in-person assessment.  Follow Up Instructions: I discussed the assessment and treatment plan with the patient. The patient was provided an opportunity to ask questions and all were answered. The patient agreed with the plan and demonstrated an understanding of the instructions.  A copy of instructions were sent to the patient via MyChart unless otherwise noted below.   The patient was advised to call back or seek an in-person evaluation if the symptoms worsen or if the condition fails to improve as anticipated.    Christine Maillard, PA-C

## 2024-04-06 NOTE — Patient Instructions (Signed)
 Oneida Bigger, thank you for joining [name], PA-C for today's virtual visit.  While this provider is not your primary care provider (PCP), if your PCP is located in our provider database this encounter information will be shared with them immediately following your visit.   A [facility] MyChart account gives you access to today's visit and all your visits, tests, and labs performed at [facility] " click here if you don't have a [facility] MyChart account or go to [email or web address]  Consent: (Patient) Christine Booth provided verbal consent for this virtual visit at the beginning of the encounter.  Current Medications:  Current Outpatient Medications:    predniSONE  (DELTASONE ) 10 MG tablet, Take 4 tablets (40 mg total) by mouth daily with breakfast for 4 days, THEN 3 tablets (30 mg total) daily with breakfast for 4 days, THEN 2 tablets (20 mg total) daily with breakfast for 3 days, THEN 1 tablet (10 mg total) daily with breakfast for 3 days., Disp: 37 tablet, Rfl: 0   triamcinolone  cream (KENALOG ) 0.1 %, Apply 1 Application topically 2 (two) times daily., Disp: 30 g, Rfl: 0   amLODipine  (NORVASC ) 2.5 MG tablet, Take 1 tablet (2.5 mg total) by mouth daily., Disp: 90 tablet, Rfl: 4   cetirizine (ZYRTEC) 10 MG tablet, Take 10 mg by mouth daily., Disp: , Rfl:    dapagliflozin  propanediol (FARXIGA ) 10 MG TABS tablet, Take 1 tablet (10 mg total) by mouth daily., Disp: 90 tablet, Rfl: 1   fluticasone  (FLONASE ) 50 MCG/ACT nasal spray, SPRAY 2 SPRAYS INTO EACH NOSTRIL EVERY DAY, Disp: 48 mL, Rfl: 1   glipiZIDE  (GLUCOTROL ) 10 MG tablet, Take 1 tablet (10 mg total) by mouth daily before breakfast., Disp: 90 tablet, Rfl: 3   glucose blood test strip, Use as instructed, Disp: 100 each, Rfl: 12   olmesartan (BENICAR) 40 MG tablet, TAKE 1 TABLET BY MOUTH EVERY DAY, Disp: 90 tablet, Rfl: 0   pantoprazole  (PROTONIX ) 40 MG tablet, TAKE 1 TABLET BY MOUTH EVERY DAY,  Disp: 90 tablet, Rfl: 3   rosuvastatin  (CRESTOR ) 40 MG tablet, TAKE 1 TABLET BY MOUTH EVERY DAY, Disp: 90 tablet, Rfl: 3   Semaglutide , 2 MG/DOSE, 8 MG/3ML SOPN, Inject 2 mg into the skin once a week., Disp: 9 mL, Rfl: 4   sennosides-docusate sodium (SENOKOT-S) 8.6-50 MG tablet, Take 1 tablet by mouth daily as needed for constipation., Disp: , Rfl:    Medications ordered in this encounter:  Meds ordered this encounter  Medications   triamcinolone  cream (KENALOG ) 0.1 %    Sig: Apply 1 Application topically 2 (two) times daily.    Dispense:  30 g    Refill:  0    Supervising Provider:   LAMPTEY, PHILIP O [[id number]]   predniSONE  (DELTASONE ) 10 MG tablet    Sig: Take 4 tablets (40 mg total) by mouth daily with breakfast for 4 days, THEN 3 tablets (30 mg total) daily with breakfast for 4 days, THEN 2 tablets (20 mg total) daily with breakfast for 3 days, THEN 1 tablet (10 mg total) daily with breakfast for 3 days.    Dispense:  37 tablet    Refill:  0    Supervising Provider:   Corine Dice [[id number]]     *If you need refills on other medications prior to your next appointment, please contact your pharmacy*  Follow-Up: Call back or seek an in-person evaluation if the symptoms worsen or if the condition fails to improve as  anticipated.  [facility] [phone]  Other Instructions Contact Dermatitis Dermatitis is when your skin becomes red, sore, and swollen.  Contact dermatitis happens when your body reacts to something that touches the skin. There are 2 types: Irritant contact dermatitis. This is when something bothers your skin, like soap. Allergic contact dermatitis. This is when your skin touches something you are allergic to, like poison ivy. What are the causes? Irritant contact dermatitis may be caused by: Makeup. Soaps. Detergents. Bleaches. Acids. Metals, like nickel. Allergic contact dermatitis may be caused  by: Plants. Chemicals. Jewelry. Latex. Medicines. Preservatives. These are things added to products to help them last longer. There may be some in your clothes. What increases the risk? Having a job where you have to be near things that bother your skin. Having asthma or eczema. What are the signs or symptoms?  Dry or flaky skin. Redness. Cracks. Itching. Moderate symptoms of this condition include: Pain or a burning feeling. Blisters. Blood or clear fluid coming from cracks in your skin. Swelling. This may be on your eyelids, mouth, or genitals. How is this treated? Your doctor will find out what is making your skin react. Then, you can protect your skin. You may need to use: Steroid creams, ointments, or medicines. Antibiotics or other ointments, if you have a skin infection. Lotion or medicines to help with itching. A bandage. Follow these instructions at home: Skin care Put moisturizer on your skin when it needs it. Put cool, wet cloths on your skin (cool compresses). Put a baking soda paste on your skin. Stir water into baking soda until it looks like a paste. Do not scratch your skin. Try not to have things rub up against your skin. Avoid tight clothing. Avoid using soaps, perfumes, and dyes. Check your skin every day for signs of infection. Check for: More redness, swelling, or pain. More fluid or blood. Warmth. Pus or a bad smell. Medicines Take or apply over-the-counter and prescription medicines only as told by your doctor. If you were prescribed antibiotics, take or apply them as told by your doctor. Do not stop using them even if you start to feel better. Bathing Take a bath with: Epsom salts. Baking soda. Colloidal oatmeal. Bathe less often. Bathe in warm water. Try not to use hot water. Bandage care If you were given a bandage, change it as told by your doctor. Wash your hands with soap and water for at least 20 seconds before and after you change  your bandage. If you cannot use soap and water, use hand sanitizer. General instructions Avoid the things that caused your reaction. If you don't know what caused it, keep a journal. Write down: What you eat. What skin products you use. What you drink. What you wear. Contact a doctor if: You do not get better with treatment. You get worse. You have signs of infection. You have a fever. You have new symptoms. Your bone or joint near the area hurts after the skin has healed. Get help right away if: You see red streaks coming from the area. The area turns darker. You have trouble breathing. This information is not intended to replace advice given to you by your health care provider. Make sure you discuss any questions you have with your health care provider. Document Revised: 05/30/2022 Document Reviewed: 05/30/2022 Elsevier Patient Education  2024 [employer].   If you have been instructed to have an in-person evaluation today at a local Urgent Care facility, please  use the link below. It will take you to a list of all of our available [facility], including address, phone number and hours of operation. Please do not delay care.  [facility]  If you or a family member do not have a primary care provider, use the link below to schedule a visit and establish care. When you choose a [facility] primary care physician or advanced practice provider, you gain a long-term partner in health. Find a Primary Care Provider  Learn more about [facility]'s in-office and virtual care options: [facility] - Get Care Now

## 2024-05-25 ENCOUNTER — Telehealth: Payer: Self-pay

## 2024-05-25 NOTE — Telephone Encounter (Signed)
 Called patient to inform that their shipment through PAP (Patient Assistance Program) has been received in office and is available for pickup Monday-Friday 8am - 5pm (lunch 12:15pm-12:45pm office closed).  Patient aware to receive they will need to sign and date the pick up paperwork at the front desk folder.   Company: [employer] Medication received: Ozempic  How many units: 4 boxes

## 2024-06-07 ENCOUNTER — Other Ambulatory Visit: Payer: Self-pay | Admitting: Nurse Practitioner

## 2024-06-08 NOTE — Telephone Encounter (Signed)
 Requested Prescriptions  Pending Prescriptions Disp Refills   pantoprazole  (PROTONIX ) 40 MG tablet [Pharmacy Med Name: PANTOPRAZOLE  SOD DR 40 MG TAB] 90 tablet 0    Sig: TAKE 1 TABLET BY MOUTH EVERY DAY     Gastroenterology: Proton Pump Inhibitors Passed - 06/08/2024  2:20 PM      Passed - Valid encounter within last 12 months    Recent Outpatient Visits           2 months ago Type 2 diabetes mellitus with hyperglycemia, without long-term current use of insulin (HCC)   [facility] [place], Melanie DASEN, NP

## 2024-06-16 ENCOUNTER — Other Ambulatory Visit: Payer: Self-pay | Admitting: Nurse Practitioner

## 2024-06-16 DIAGNOSIS — E1159 Type 2 diabetes mellitus with other circulatory complications: Secondary | ICD-10-CM

## 2024-06-16 DIAGNOSIS — E1165 Type 2 diabetes mellitus with hyperglycemia: Secondary | ICD-10-CM

## 2024-06-17 NOTE — Telephone Encounter (Signed)
 Requested Prescriptions  Pending Prescriptions Disp Refills   olmesartan (BENICAR) 40 MG tablet [Pharmacy Med Name: OLMESARTAN MEDOXOMIL 40 MG TAB] 90 tablet 0    Sig: TAKE 1 TABLET BY MOUTH EVERY DAY     Cardiovascular:  Angiotensin Receptor Blockers Passed - 06/17/2024  2:31 PM      Passed - Cr in normal range and within 180 days    Creatinine, Ser  Date Value Ref Range Status  04/01/2024 0.61 0.57 - 1.00 mg/dL Final         Passed - K in normal range and within 180 days    Potassium  Date Value Ref Range Status  04/01/2024 3.7 3.5 - 5.2 mmol/L Final         Passed - Patient is not pregnant      Passed - Last BP in normal range    BP Readings from Last 1 Encounters:  04/01/24 134/80         Passed - Valid encounter within last 6 months    Recent Outpatient Visits           2 months ago Type 2 diabetes mellitus with hyperglycemia, without long-term current use of insulin (HCC)   [facility] [place], Jolene T, NP               rosuvastatin  (CRESTOR ) 40 MG tablet [Pharmacy Med Name: ROSUVASTATIN  CALCIUM  40 MG TAB] 90 tablet 0    Sig: TAKE 1 TABLET BY MOUTH EVERY DAY     Cardiovascular:  Antilipid - Statins 2 Failed - 06/17/2024  2:31 PM      Failed - Lipid Panel in normal range within the last 12 months    Cholesterol, Total  Date Value Ref Range Status  04/01/2024 118 100 - 199 mg/dL Final   Cholesterol Piccolo, Waived  Date Value Ref Range Status  05/04/2019 160 <200 mg/dL Final    Comment:                            Desirable                <200                         Borderline High      200- 239                         High                     >239    LDL Chol Calc (NIH)  Date Value Ref Range Status  04/01/2024 43 0 - 99 mg/dL Final   HDL  Date Value Ref Range Status  04/01/2024 50 >39 mg/dL Final   Triglycerides  Date Value Ref Range Status  04/01/2024 145 0 - 149 mg/dL Final   Triglycerides Piccolo,Waived  Date Value  Ref Range Status  05/04/2019 108 <150 mg/dL Final    Comment:                            Normal                   <150  Borderline High     150 - 199                         High                200 - 499                         Very High                >499          Passed - Cr in normal range and within 360 days    Creatinine, Ser  Date Value Ref Range Status  04/01/2024 0.61 0.57 - 1.00 mg/dL Final         Passed - Patient is not pregnant      Passed - Valid encounter within last 12 months    Recent Outpatient Visits           2 months ago Type 2 diabetes mellitus with hyperglycemia, without long-term current use of insulin [facility])   [facility] Valerio Moris T, NP              '

## 2024-06-22 ENCOUNTER — Telehealth: Payer: Self-pay

## 2024-06-22 ENCOUNTER — Encounter: Payer: Self-pay | Admitting: Nurse Practitioner

## 2024-06-22 NOTE — Progress Notes (Signed)
   06/22/2024  Patient ID: Naomie Cy Mealing, female   DOB: [birth year], 68 y.o.   MRN: [id number]  In basket message from patient's PCP, Melanie Polio, NP, stating most recent Ozempic  received from [employer] was for 0.25/0.5mg , and patient is currently on 2mg  dose.  Patient sent an image today of medication box reflecting she did receive the 0.25/0.5mg , but the packing slip from the last order picked up from [facility] on 6/18 reflects 4 boxes of 2mg  were sent.  Verifying with the office that they do not have 2mg  pens there for the patient.  If not, I will ask medication assistance team to follow-up with [employer] to see how we can get these replaced.  Channing DELENA Mealing, PharmD, DPLA

## 2024-06-24 ENCOUNTER — Telehealth: Payer: Self-pay

## 2024-06-24 NOTE — Telephone Encounter (Signed)
 Letter is ready for your signature. Thank you!

## 2024-06-24 NOTE — Progress Notes (Signed)
   06/24/2024  Patient ID: Christine Booth, female   DOB: 07/27/1956, 68 y.o.   MRN: [id number]  Medication delivery from [employer] with shipment date of 05/09/24, picked up by patient 05/25/24, was for incorrect dose of Ozempic .  Patient received 4 boxes of Ozempic  0.25/0.5mg , and this should have been for Ozempic  2mg .  Company states a letter from the provider is required to replace the incorrect product, and they did state Dr. Vicci could sign the letter since PCP is on PAL.  The company is also faxing a return label for the incorrect dose, but the patient has been using these pens to administer 0.5mg  weekly since she was out of the 2mg  pens.  I am faxing the letter for Dr. Vicci to sign if in agreement and fax to [employer] at [phone].  I will follow-up with the company next week to make sure they are able to send the replacement product.  Christine Booth, PharmD, DPLA

## 2024-07-01 ENCOUNTER — Telehealth: Payer: Self-pay

## 2024-07-01 NOTE — Progress Notes (Signed)
   07/01/2024  Patient ID: Christine Booth, female   DOB: 11/18/1956, 68 y.o.   MRN: [id number]  Contacted [employer] to verify receipt of letter faxed 7/23 in regard to the incorrect dose of Ozempic  being shipped in June.  Representative states a replacement order for Ozempic  2mg  went into processing 7/23 and should arrive at [facility] 10-14 business days after that (by 8/12).  Sending patient a MyChart message to make her aware.    [name], PharmD, DPLA

## 2024-07-03 ENCOUNTER — Other Ambulatory Visit: Payer: Self-pay | Admitting: Nurse Practitioner

## 2024-07-04 NOTE — Telephone Encounter (Signed)
 Requested Prescriptions  Pending Prescriptions Disp Refills   fluticasone  (FLONASE ) 50 MCG/ACT nasal spray [Pharmacy Med Name: FLUTICASONE  PROP 50 MCG SPRAY] 48 mL 0    Sig: SPRAY 2 SPRAYS INTO EACH NOSTRIL EVERY DAY     Ear, Nose, and Throat: Nasal Preparations - Corticosteroids Passed - 07/04/2024  3:55 PM      Passed - Valid encounter within last 12 months    Recent Outpatient Visits           3 months ago Type 2 diabetes mellitus with hyperglycemia, without long-term current use of insulin (HCC)    [facility] [place], Melanie DASEN, NP

## 2024-07-10 NOTE — Patient Instructions (Signed)
Be Involved in Caring For Your Health:  Taking Medications When medications are taken as directed, they can greatly improve your health. But if they are not taken as prescribed, they may not work. In some cases, not taking them correctly can be harmful. To help ensure your treatment remains effective and safe, understand your medications and how to take them. Bring your medications to each visit for review by your provider.  Your lab results, notes, and after visit summary will be available on My Chart. We strongly encourage you to use this feature. If lab results are abnormal the clinic will contact you with the appropriate steps. If the clinic does not contact you assume the results are satisfactory. You can always view your results on My Chart. If you have questions regarding your health or results, please contact the clinic during office hours. You can also ask questions on My Chart.  We at [name] are grateful that you chose [place] to provide your care. We strive to provide evidence-based and compassionate care and are always looking for feedback. If you get a survey from the clinic please complete this so we can hear your opinions.  Diabetes Mellitus and Exercise Regular exercise is important for your health, especially if you have diabetes mellitus. Exercise is not just about losing weight. It can also help you increase muscle strength and bone density and reduce body fat and stress. This can help your level of endurance and make you more fit and flexible. Why should I exercise if I have diabetes? Exercise has many benefits for people with diabetes. It can: Help lower and control your blood sugar (glucose). Help your body respond better and become more sensitive to the hormone insulin. Reduce how much insulin your body needs. Lower your risk for heart disease by: Lowering how much "bad" cholesterol and triglycerides you have in your body. Increasing how much "good" cholesterol  you have in your body. Lowering your blood pressure. Lowering your blood glucose levels. What is my activity plan? Your health care provider or an expert trained in diabetes care (certified diabetes educator) can help you make an activity plan. This plan can help you find the type of exercise that works for you. It may also tell you how often to exercise and for how long. Be sure to: Get at least 150 minutes of medium-intensity or high-intensity exercise each week. This may involve brisk walking, biking, or water aerobics. Do stretching and strengthening exercises at least 2 times a week. This may involve yoga or weight lifting. Spread out your activity over at least 3 days of the week. Get some form of physical activity each day. Do not go more than 2 days in a row without some kind of activity. Avoid being inactive for more than 30 minutes at a time. Take frequent breaks to walk or stretch. Choose activities that you enjoy. Set goals that you know you can accomplish. Start slowly and increase the intensity of your exercise over time. How do I manage my diabetes during exercise?  Monitor your blood glucose Check your blood glucose before and after you exercise. If your blood glucose is 240 mg/dL (40.9 mmol/L) or higher before you exercise, check your urine for ketones. These are chemicals created by the liver. If you have ketones in your urine, do not exercise until your blood glucose returns to normal. If your blood glucose is 100 mg/dL (5.6 mmol/L) or lower, eat a snack that has 15-20 grams of carbohydrate in  it. Check your blood glucose 15 minutes after the snack to make sure that your level is above 100 mg/dL (5.6 mmol/L) before you start to exercise. Your risk for low blood glucose (hypoglycemia) goes up during and after exercise. Know the symptoms of this condition and how to treat it. Follow these instructions at home: Keep a carbohydrate snack on hand for use before, during, and after  exercise. This can help prevent or treat hypoglycemia. Avoid injecting insulin into parts of your body that are going to be used during exercise. This may include: Your arms, when you are going to play tennis. Your legs, when you are about to go jogging. Keep track of your exercise habits. This can help you and your health care provider watch and adjust your activity plan. Write down: What you eat before and after you exercise. Blood glucose levels before and after you exercise. The type and amount of exercise you do. Talk to your health care provider before you start a new activity. They may need to: Make sure that the activity is safe for you. Adjust your insulin, other medicines, and food that you eat. Drink water while you exercise. This can stop you from losing too much water (dehydration). It can also prevent problems caused by having a lot of heat in your body (heat stroke). Where to find more information American Diabetes Association: diabetes.org Association of Diabetes Care & Education Specialists: diabeteseducator.org This information is not intended to replace advice given to you by your health care provider. Make sure you discuss any questions you have with your health care provider. Document Revised: 05/14/2022 Document Reviewed: 05/14/2022 Elsevier Patient Education  2024 [employer].

## 2024-07-15 ENCOUNTER — Ambulatory Visit (INDEPENDENT_AMBULATORY_CARE_PROVIDER_SITE_OTHER): Admitting: Nurse Practitioner

## 2024-07-15 ENCOUNTER — Encounter: Payer: Self-pay | Admitting: Nurse Practitioner

## 2024-07-15 VITALS — BP 123/72 | HR 79 | Temp 98.1°F | Ht 61.0 in | Wt 162.2 lb

## 2024-07-15 DIAGNOSIS — I152 Hypertension secondary to endocrine disorders: Secondary | ICD-10-CM | POA: Diagnosis not present

## 2024-07-15 DIAGNOSIS — E785 Hyperlipidemia, unspecified: Secondary | ICD-10-CM | POA: Diagnosis not present

## 2024-07-15 DIAGNOSIS — E1159 Type 2 diabetes mellitus with other circulatory complications: Secondary | ICD-10-CM | POA: Diagnosis not present

## 2024-07-15 DIAGNOSIS — E1169 Type 2 diabetes mellitus with other specified complication: Secondary | ICD-10-CM

## 2024-07-15 DIAGNOSIS — R011 Cardiac murmur, unspecified: Secondary | ICD-10-CM | POA: Diagnosis not present

## 2024-07-15 DIAGNOSIS — E1165 Type 2 diabetes mellitus with hyperglycemia: Secondary | ICD-10-CM | POA: Diagnosis not present

## 2024-07-15 DIAGNOSIS — N393 Stress incontinence (female) (male): Secondary | ICD-10-CM

## 2024-07-15 DIAGNOSIS — E66811 Obesity, class 1: Secondary | ICD-10-CM

## 2024-07-15 DIAGNOSIS — Z6830 Body mass index (BMI) 30.0-30.9, adult: Secondary | ICD-10-CM

## 2024-07-15 DIAGNOSIS — E6609 Other obesity due to excess calories: Secondary | ICD-10-CM

## 2024-07-15 LAB — BAYER DCA HB A1C WAIVED: HB A1C (BAYER DCA - WAIVED): 8.6 % — ABNORMAL HIGH (ref 4.8–5.6)

## 2024-07-15 NOTE — Assessment & Plan Note (Signed)
 Noted more over the past 3 weeks.  Will start by focusing on pelvic floor exercises at home.  Educated her on this and provided instructions for her to use at home.  Trial this for 3 months, consistently on daily basis and if no benefit consider UroGyn visit.  Ensure regular bowel pattern.

## 2024-07-15 NOTE — Assessment & Plan Note (Signed)
No symptoms, present for years.  Will consider echo if symptoms present -- discussed with patient. 

## 2024-07-15 NOTE — Assessment & Plan Note (Signed)
 Chronic, stable.  Continue current medication regimen and adjust as needed.  Lipid panel today.

## 2024-07-15 NOTE — Assessment & Plan Note (Signed)
 BMI 30.65 is working on exercise regimen and diet.  Recommended eating smaller high protein, low fat meals more frequently and exercising 30 mins a day 5 times a week with a goal of 10-15lb weight loss in the next 3 months. Patient voiced their understanding and motivation to adhere to these recommendations.

## 2024-07-15 NOTE — Assessment & Plan Note (Signed)
 Chronic, stable.  BP remains at goal in office.  Continue Olmesartan and Amlodipine  at current dosing and adjust as needed.  LABS: CMP.  Continue to monitor BP at home regularly and focus on DASH diet + regular exercise.

## 2024-07-15 NOTE — Progress Notes (Signed)
 BP 123/72   Pulse 79   Temp 98.1 F (36.7 C) (Oral)   Ht 5' 1 (1.549 m)   Wt 162 lb 3.2 oz (73.6 kg)   LMP  (LMP Unknown)   SpO2 96%   BMI 30.65 kg/m    Subjective:    Patient ID: Christine Booth, female    DOB: Dec 27, 1955, 68 y.o.   MRN: [id number]  HPI: Christine Booth is a 68 y.o. female  Chief Complaint  Patient presents with   Diabetes   Hyperlipidemia   Hypertension   DIABETES A1c in April 6.9%.  Continues Farxiga  10 MG daily (assistance), Ozempic  2 MG weekly (assistance, although received wrong dose recently and waiting on correct dose -- been on this for over a month 1 MG), Glipizide  10 MG daily.  Is taking medications consistently.  Has not been able to go walking due to heat outside.     Metformin  initially taken, caused severe constipation.   Hypoglycemic episodes:no Polydipsia/polyuria: no Visual disturbance: no Chest pain: no Paresthesias: no Glucose Monitoring: yes -- every two weeks             Accucheck frequency: 200 range             Fasting glucose:              Post prandial:             Evening:             Before meals: Taking Insulin?: no             Long acting insulin:             Short acting insulin: Blood Pressure Monitoring: daily Retinal Examination: Up To Date - Dr. Sharlot Harpin [place] Foot Exam: Up to Date Pneumovax: Up to Date Influenza: Up to Date Aspirin: yes    HYPERTENSION / HYPERLIPIDEMIA Continues to take Olmesartan 40 MG daily, Amlodipine  2.5 MG daily, and Rosuvastatin  40 MG daily.    Has been having occasional issues with urinary incontinence, over past few weeks.  One episode after sleeping 6 hours which is not common for her and then other times dribbling with sudden movement.  Does have occasional with coughing and sneezing.   Satisfied with current treatment? yes Duration of hypertension: chronic BP monitoring frequency: every couple weeks BP range:  <130/80 BP medication side effects: no Duration of  hyperlipidemia: chronic Cholesterol medication side effects: no Cholesterol supplements: none Medication compliance: good compliance Aspirin: yes Recent stressors: no Recurrent headaches: no Visual changes: no Palpitations: no Dyspnea: no Chest pain: no Lower extremity edema: no Dizzy/lightheaded: no  Relevant past medical, surgical, family and social history reviewed and updated as indicated. Interim medical history since our last visit reviewed. Allergies and medications reviewed and updated.  Review of Systems  Constitutional:  Negative for activity change, appetite change, diaphoresis, fatigue and fever.  Respiratory:  Negative for cough, chest tightness and shortness of breath.   Cardiovascular:  Negative for chest pain, palpitations and leg swelling.  Gastrointestinal: Negative.   Endocrine: Negative for polydipsia, polyphagia and polyuria.  Neurological: Negative.   Psychiatric/Behavioral: Negative.     Per HPI unless specifically indicated above     Objective:    BP 123/72   Pulse 79   Temp 98.1 F (36.7 C) (Oral)   Ht 5' 1 (1.549 m)   Wt 162 lb 3.2 oz (73.6 kg)   LMP  (LMP Unknown)   SpO2 96%  BMI 30.65 kg/m   Wt Readings from Last 3 Encounters:  07/15/24 162 lb 3.2 oz (73.6 kg)  04/01/24 163 lb 9.6 oz (74.2 kg)  01/08/24 162 lb 9.6 oz (73.8 kg)    Physical Exam Vitals and nursing note reviewed.  Constitutional:      General: She is awake. She is not in acute distress.    Appearance: She is well-developed, well-groomed and overweight. She is not ill-appearing.  HENT:     Head: Normocephalic.     Right Ear: Hearing, ear canal and external ear normal. No drainage.     Left Ear: Hearing, ear canal and external ear normal. No drainage.     Ears:     Comments:      Mouth/Throat:     Mouth: Mucous membranes are moist.  Eyes:     General: Lids are normal.        Right eye: No discharge.        Left eye: No discharge.     Conjunctiva/sclera:  Conjunctivae normal.     Pupils: Pupils are equal, round, and reactive to light.  Neck:     Thyroid : No thyromegaly.     Vascular: No carotid bruit.  Cardiovascular:     Rate and Rhythm: Normal rate and regular rhythm.     Heart sounds: Murmur heard.     Systolic murmur is present with a grade of 2/6.     No gallop.  Pulmonary:     Effort: Pulmonary effort is normal. No accessory muscle usage or respiratory distress.     Breath sounds: Normal breath sounds.  Abdominal:     General: Bowel sounds are normal.     Palpations: Abdomen is soft.  Musculoskeletal:     Cervical back: Normal range of motion and neck supple.     Right lower leg: No edema.     Left lower leg: No edema.  Skin:    General: Skin is warm and dry.  Neurological:     Mental Status: She is alert and oriented to person, place, and time.  Psychiatric:        Attention and Perception: Attention normal.        Mood and Affect: Mood normal.        Behavior: Behavior normal. Behavior is cooperative.        Thought Content: Thought content normal.        Judgment: Judgment normal.    Diabetic Foot Exam - Simple   Simple Foot Form Visual Inspection No deformities, no ulcerations, no other skin breakdown bilaterally: Yes Sensation Testing Intact to touch and monofilament testing bilaterally: Yes Pulse Check Posterior Tibialis and Dorsalis pulse intact bilaterally: Yes Comments     Results for orders placed or performed in visit on 04/01/24  Bayer DCA Hb A1c Waived   Collection Time: 04/01/24  8:14 AM  Result Value Ref Range   HB A1C (BAYER DCA - WAIVED) 6.9 (H) 4.8 - 5.6 %  Comprehensive metabolic panel with GFR   Collection Time: 04/01/24  8:14 AM  Result Value Ref Range   Glucose 148 (H) 70 - 99 mg/dL   BUN 9 8 - 27 mg/dL   Creatinine, Ser 9.38 0.57 - 1.00 mg/dL   eGFR 97 >40 fO/fpw/8.26   BUN/Creatinine Ratio 15 12 - 28   Sodium 144 134 - 144 mmol/L   Potassium 3.7 3.5 - 5.2 mmol/L   Chloride 110 (H)  96 - 106 mmol/L   CO2  17 (L) 20 - 29 mmol/L   Calcium  9.4 8.7 - 10.3 mg/dL   Total Protein 6.4 6.0 - 8.5 g/dL   Albumin 4.5 3.9 - 4.9 g/dL   Globulin, Total 1.9 1.5 - 4.5 g/dL   Bilirubin Total 0.4 0.0 - 1.2 mg/dL   Alkaline Phosphatase 63 44 - 121 IU/L   AST 21 0 - 40 IU/L   ALT 17 0 - 32 IU/L  Lipid Panel w/o Chol/HDL Ratio   Collection Time: 04/01/24  8:14 AM  Result Value Ref Range   Cholesterol, Total 118 100 - 199 mg/dL   Triglycerides 854 0 - 149 mg/dL   HDL 50 >60 mg/dL   VLDL Cholesterol Cal 25 5 - 40 mg/dL   LDL Chol Calc (NIH) 43 0 - 99 mg/dL      Assessment & Plan:   Problem List Items Addressed This Visit       Cardiovascular and Mediastinum   Hypertension associated with diabetes (HCC)   Chronic, stable.  BP remains at goal in office.  Continue Olmesartan and Amlodipine  at current dosing and adjust as needed.  LABS: CMP.  Continue to monitor BP at home regularly and focus on DASH diet + regular exercise.        Relevant Medications   glipiZIDE  (GLUCOTROL ) 5 MG tablet   Other Relevant Orders   Bayer DCA Hb A1c Waived   Comprehensive metabolic panel with GFR     Endocrine   Type 2 diabetes mellitus with hyperglycemia (HCC) - Primary   Chronic, ongoing with A1c 8.6% today, trend up as has not been on correct Ozempic  dose for awhile, and urine ALB 30 in January 2025, continue Olmesartan for kidney protection. - At this time continue Ozempic , she is at max dose, gets via assistance and Farxiga  10 MG daily (which she just receives via assistance).  Her 2 MG pens should be at her home soon. - Continue Glipizide  10 MG every morning, which is cost effective and may help lower levels further.  Educated her on hypoglycemia risk with this.  She will monitor and alert provider if BS consistently <70.  Could consider Mounjaro in future and stopping Ozempic  if assistance program available in future. - Avoid Metformin  as did not tolerate in past.   - Recommend continue to  check BS daily and return to regular exercise and diet focus to maintain A1C <7.   - Eye and foot exams up to date.  Pneumococcal and flu vaccines up to date. Return in 3 months.      Relevant Medications   glipiZIDE  (GLUCOTROL ) 5 MG tablet   Other Relevant Orders   Bayer DCA Hb A1c Waived   Hyperlipidemia associated with type 2 diabetes mellitus (HCC)   Chronic, stable. Continue current medication regimen and adjust as needed.  Lipid panel today.      Relevant Medications   glipiZIDE  (GLUCOTROL ) 5 MG tablet   Other Relevant Orders   Bayer DCA Hb A1c Waived   Comprehensive metabolic panel with GFR   Lipid Panel w/o Chol/HDL Ratio     Other   Systolic murmur   No symptoms, present for years.  Will consider echo if symptoms present -- discussed with patient.      Stress incontinence in female   Noted more over the past 3 weeks.  Will start by focusing on pelvic floor exercises at home.  Educated her on this and provided instructions for her to use at home.  Trial this for  3 months, consistently on daily basis and if no benefit consider UroGyn visit.  Ensure regular bowel pattern.      Obesity   BMI 30.65 is working on exercise regimen and diet.  Recommended eating smaller high protein, low fat meals more frequently and exercising 30 mins a day 5 times a week with a goal of 10-15lb weight loss in the next 3 months. Patient voiced their understanding and motivation to adhere to these recommendations.       Relevant Medications   glipiZIDE  (GLUCOTROL ) 5 MG tablet     Follow up plan: Return in about 3 months (around 10/15/2024) for T2DM, HTN/HLD.

## 2024-07-15 NOTE — Assessment & Plan Note (Signed)
 Chronic, ongoing with A1c 8.6% today, trend up as has not been on correct Ozempic  dose for awhile, and urine ALB 30 in January 2025, continue Olmesartan for kidney protection. - At this time continue Ozempic , she is at max dose, gets via assistance and Farxiga  10 MG daily (which she just receives via assistance).  Her 2 MG pens should be at her home soon. - Continue Glipizide  10 MG every morning, which is cost effective and may help lower levels further.  Educated her on hypoglycemia risk with this.  She will monitor and alert provider if BS consistently <70.  Could consider Mounjaro in future and stopping Ozempic  if assistance program available in future. - Avoid Metformin  as did not tolerate in past.   - Recommend continue to check BS daily and return to regular exercise and diet focus to maintain A1C <7.   - Eye and foot exams up to date.  Pneumococcal and flu vaccines up to date. Return in 3 months.

## 2024-07-16 ENCOUNTER — Ambulatory Visit: Payer: Self-pay | Admitting: Nurse Practitioner

## 2024-07-16 LAB — COMPREHENSIVE METABOLIC PANEL WITH GFR
ALT: 19 IU/L (ref 0–32)
AST: 21 IU/L (ref 0–40)
Albumin: 4.4 g/dL (ref 3.9–4.9)
Alkaline Phosphatase: 70 IU/L (ref 44–121)
BUN/Creatinine Ratio: 15 (ref 12–28)
BUN: 9 mg/dL (ref 8–27)
Bilirubin Total: 0.5 mg/dL (ref 0.0–1.2)
CO2: 21 mmol/L (ref 20–29)
Calcium: 9.5 mg/dL (ref 8.7–10.3)
Chloride: 106 mmol/L (ref 96–106)
Creatinine, Ser: 0.6 mg/dL (ref 0.57–1.00)
Globulin, Total: 2.1 g/dL (ref 1.5–4.5)
Glucose: 170 mg/dL — ABNORMAL HIGH (ref 70–99)
Potassium: 3.4 mmol/L — ABNORMAL LOW (ref 3.5–5.2)
Sodium: 142 mmol/L (ref 134–144)
Total Protein: 6.5 g/dL (ref 6.0–8.5)
eGFR: 97 mL/min/1.73 (ref >59)

## 2024-07-16 LAB — LIPID PANEL W/O CHOL/HDL RATIO
Cholesterol, Total: 111 mg/dL (ref 100–199)
HDL: 53 mg/dL (ref >39)
LDL Chol Calc (NIH): 40 mg/dL (ref 0–99)
Triglycerides: 98 mg/dL (ref 0–149)
VLDL Cholesterol Cal: 18 mg/dL (ref 5–40)

## 2024-07-16 NOTE — Progress Notes (Signed)
 Contacted via MyChart  Good morning Christine Booth, your labs have returned: - Kidney function, creatinine and eGFR, remains normal, as is liver function, AST and ALT.  - Potassium is very mildly low, please increase potassium rich foods in diet and we will recheck next visit.  Bananas, orange juice, dried fruit, potatoes, mangoes. - Lipid panel remains stable.  No medication changes needed.  Any questions? Keep being stellar!!  Thank you for allowing me to participate in your care.  I appreciate you. Kindest regards, Diantha Paxson

## 2024-07-20 ENCOUNTER — Telehealth: Payer: Self-pay

## 2024-07-20 NOTE — Telephone Encounter (Signed)
 Gave [employer] a call a request from [facility] Cheryl,pt has not received (Ozempic  2 mg) a refill change from July. Per [employer] representative they received the refill request change from providers office on July 23 th and took them to process until August 5th and have mail out to provider office,pt should be receiving it on August 25 th.

## 2024-07-21 NOTE — Progress Notes (Signed)
   07/21/2024  Patient ID: Christine Booth, female   DOB: [birth year], 68 y.o.   MRN: [id number]  Subjective/Objective Telephone visit to follow-up on management of T2DM  Diabetes Management Plan -Current medications:  Farxiga  10mg  daily, glipizide  10mg  daily with breakfast, Ozempic  1mg  weekly -Ozempic  through [employer]-enrollment good through 12/07/24.  Company sent incorrect pens last fill (0.25/0.5mg ), so patient has been  using two injections of 0.5mg  weekly to total 1mg .  She will take her last dose on hand next Tuesday and leaves for a 3.5wk vacation the following Thursday.  Ozempic  2mg  is supposed to arrive at [facility] Monday, 8/25. -Farxiga  initially denied for 2025 [employer]AP (in November), but then PCP received request for refill; and patient has received refills for this medication from the program consistently. -Glipizide  recently increased to 10mg  daily- patient take 2 tablets of 5mg  on hand -A1c this month had increased to 8.6%- likely due to not having Ozempic  2mg ; patient also endorses walking less due to heat and rain this summer, but she is getting back into walking regularly -She does check home FBG on occasion and states this has been 160+ -Does not endorse any s/sx of hypoglycemia  Assessment/Plan  Diabetes Management Plan -Currently uncontrolled with A1c >7% -I will contact [facility] 8/25 to verify Ozempic  2mg  has arrived; if not, I will follow-up with [employer] and work with office to provide additional samples if needed/available -Continue current regimen at this time  Follow-up:  8/25  Christine Booth [name], PharmD, DPLA

## 2024-07-22 ENCOUNTER — Other Ambulatory Visit: Payer: Self-pay

## 2024-07-28 ENCOUNTER — Telehealth: Payer: Self-pay

## 2024-07-28 NOTE — Telephone Encounter (Signed)
 Called patient to inform that their shipment through PAP (Patient Assistance Program) has been received in office and is available for pickup Monday-Friday 8am - 5pm (lunch 12:15pm-12:45pm office closed).  Patient aware to receive they will need to sign and date the pick up paperwork at the front desk folder.   Company: [employer] Medication received: Ozempic  How many units: 4 boxes

## 2024-08-05 ENCOUNTER — Telehealth: Payer: Self-pay

## 2024-08-05 ENCOUNTER — Other Ambulatory Visit: Payer: Medicare Other

## 2024-08-05 NOTE — Telephone Encounter (Signed)
 4 boxes of Ozempic  received for the patient. Called and notified patient that medication was ready for pick up. Patient states she would come around 9/18 or 9/19 to pick up as she is on vacation right now.

## 2024-08-27 ENCOUNTER — Other Ambulatory Visit: Payer: Self-pay | Admitting: Nurse Practitioner

## 2024-08-29 NOTE — Telephone Encounter (Signed)
 Requested medication (s) are due for refill today: routing for review  Requested medication (s) are on the active medication list: no  Last refill:  06/01/24  Future visit scheduled: yes  Notes to clinic:  Unable to refill per protocol, Rx expired.Historical medication.      Requested Prescriptions  Pending Prescriptions Disp Refills   glipiZIDE  (GLUCOTROL ) 5 MG tablet [Pharmacy Med Name: GLIPIZIDE  5 MG TABLET] 135 tablet 4    Sig: TAKE 1.5 TABLETS (7.5 MG TOTAL) BY MOUTH DAILY BEFORE BREAKFAST.     Endocrinology:  Diabetes - Sulfonylureas Failed - 08/29/2024 11:22 AM      Failed - HBA1C is between 0 and 7.9 and within 180 days    HB A1C (BAYER DCA - WAIVED)  Date Value Ref Range Status  07/15/2024 8.6 (H) 4.8 - 5.6 % Final    Comment:             Prediabetes: 5.7 - 6.4          Diabetes: >6.4          Glycemic control for adults with diabetes: <7.0          Passed - Cr in normal range and within 360 days    Creatinine, Ser  Date Value Ref Range Status  07/15/2024 0.60 0.57 - 1.00 mg/dL Final         Passed - Valid encounter within last 6 months    Recent Outpatient Visits           1 month ago Type 2 diabetes mellitus with hyperglycemia, without long-term current use of insulin (HCC)   [place] [facility] [place], [place] T, NP   5 months ago Type 2 diabetes mellitus with hyperglycemia, without long-term current use of insulin (HCC)   [facility] [place], Melanie DASEN, NP

## 2024-09-03 ENCOUNTER — Other Ambulatory Visit: Payer: Self-pay | Admitting: Nurse Practitioner

## 2024-09-06 NOTE — Telephone Encounter (Signed)
 Requested Prescriptions  Pending Prescriptions Disp Refills   pantoprazole  (PROTONIX ) 40 MG tablet [Pharmacy Med Name: PANTOPRAZOLE  SOD DR 40 MG TAB] 90 tablet 0    Sig: TAKE 1 TABLET BY MOUTH EVERY DAY     Gastroenterology: Proton Pump Inhibitors Passed - 09/06/2024  8:59 AM      Passed - Valid encounter within last 12 months    Recent Outpatient Visits           1 month ago Type 2 diabetes mellitus with hyperglycemia, without long-term current use of insulin (HCC)   [place] [facility], [place] T, NP   5 months ago Type 2 diabetes mellitus with hyperglycemia, without long-term current use of insulin (HCC)   [place] [facility] [place], Melanie DASEN, NP

## 2024-09-14 ENCOUNTER — Other Ambulatory Visit: Payer: Self-pay | Admitting: Nurse Practitioner

## 2024-09-14 DIAGNOSIS — I152 Hypertension secondary to endocrine disorders: Secondary | ICD-10-CM

## 2024-09-14 DIAGNOSIS — E1165 Type 2 diabetes mellitus with hyperglycemia: Secondary | ICD-10-CM

## 2024-09-15 NOTE — Telephone Encounter (Signed)
 Requested Prescriptions  Pending Prescriptions Disp Refills   olmesartan (BENICAR) 40 MG tablet [Pharmacy Med Name: OLMESARTAN MEDOXOMIL 40 MG TAB] 90 tablet 0    Sig: TAKE 1 TABLET BY MOUTH EVERY DAY     Cardiovascular:  Angiotensin Receptor Blockers Failed - 09/15/2024  2:35 PM      Failed - K in normal range and within 180 days    Potassium  Date Value Ref Range Status  07/15/2024 3.4 (L) 3.5 - 5.2 mmol/L Final         Passed - Cr in normal range and within 180 days    Creatinine, Ser  Date Value Ref Range Status  07/15/2024 0.60 0.57 - 1.00 mg/dL Final         Passed - Patient is not pregnant      Passed - Last BP in normal range    BP Readings from Last 1 Encounters:  07/15/24 123/72         Passed - Valid encounter within last 6 months    Recent Outpatient Visits           2 months ago Type 2 diabetes mellitus with hyperglycemia, without long-term current use of insulin (HCC)   [place] [facility] [place], Ivalee T, NP   5 months ago Type 2 diabetes mellitus with hyperglycemia, without long-term current use of insulin (HCC)   [place] [facility] [place], Jolene T, NP               rosuvastatin  (CRESTOR ) 40 MG tablet [Pharmacy Med Name: ROSUVASTATIN  CALCIUM  40 MG TAB] 90 tablet 0    Sig: TAKE 1 TABLET BY MOUTH EVERY DAY     Cardiovascular:  Antilipid - Statins 2 Failed - 09/15/2024  2:35 PM      Failed - Lipid Panel in normal range within the last 12 months    Cholesterol, Total  Date Value Ref Range Status  07/15/2024 111 100 - 199 mg/dL Final   Cholesterol Piccolo, Waived  Date Value Ref Range Status  05/04/2019 160 <200 mg/dL Final    Comment:                            Desirable                <200                         Borderline High      200- 239                         High                     >239    LDL Chol Calc (NIH)  Date Value Ref Range Status  07/15/2024 40 0 - 99 mg/dL Final   HDL  Date Value Ref  Range Status  07/15/2024 53 >39 mg/dL Final   Triglycerides  Date Value Ref Range Status  07/15/2024 98 0 - 149 mg/dL Final   Triglycerides Piccolo,Waived  Date Value Ref Range Status  05/04/2019 108 <150 mg/dL Final    Comment:                            Normal                   <  150                         Borderline High     150 - 199                         High                200 - 499                         Very High                >499          Passed - Cr in normal range and within 360 days    Creatinine, Ser  Date Value Ref Range Status  07/15/2024 0.60 0.57 - 1.00 mg/dL Final         Passed - Patient is not pregnant      Passed - Valid encounter within last 12 months    Recent Outpatient Visits           2 months ago Type 2 diabetes mellitus with hyperglycemia, without long-term current use of insulin (HCC)   [place] [facility] [place], [place] T, NP   5 months ago Type 2 diabetes mellitus with hyperglycemia, without long-term current use of insulin (HCC)   [place] [facility] [place], Melanie DASEN, NP

## 2024-09-29 ENCOUNTER — Other Ambulatory Visit: Payer: Self-pay | Admitting: Nurse Practitioner

## 2024-09-30 NOTE — Telephone Encounter (Signed)
 Requested Prescriptions  Pending Prescriptions Disp Refills   fluticasone  (FLONASE ) 50 MCG/ACT nasal spray [Pharmacy Med Name: FLUTICASONE  PROP 50 MCG SPRAY] 48 mL 0    Sig: SPRAY 2 SPRAYS INTO EACH NOSTRIL EVERY DAY     Ear, Nose, and Throat: Nasal Preparations - Corticosteroids Passed - 09/30/2024  1:13 PM      Passed - Valid encounter within last 12 months    Recent Outpatient Visits           2 months ago Type 2 diabetes mellitus with hyperglycemia, without long-term current use of insulin (HCC)   [place] [facility] [place], [place] T, NP   6 months ago Type 2 diabetes mellitus with hyperglycemia, without long-term current use of insulin (HCC)    [facility] [place], Melanie DASEN, NP

## 2024-10-16 NOTE — Patient Instructions (Incomplete)

## 2024-10-21 ENCOUNTER — Ambulatory Visit: Admitting: Nurse Practitioner

## 2024-10-21 ENCOUNTER — Encounter: Payer: Self-pay | Admitting: Nurse Practitioner

## 2024-10-21 VITALS — BP 133/84 | HR 75 | Temp 98.4°F | Resp 14 | Ht 62.0 in | Wt 160.0 lb

## 2024-10-21 DIAGNOSIS — Z7984 Long term (current) use of oral hypoglycemic drugs: Secondary | ICD-10-CM

## 2024-10-21 DIAGNOSIS — E785 Hyperlipidemia, unspecified: Secondary | ICD-10-CM | POA: Diagnosis not present

## 2024-10-21 DIAGNOSIS — E1169 Type 2 diabetes mellitus with other specified complication: Secondary | ICD-10-CM | POA: Diagnosis not present

## 2024-10-21 DIAGNOSIS — E1165 Type 2 diabetes mellitus with hyperglycemia: Secondary | ICD-10-CM | POA: Diagnosis not present

## 2024-10-21 DIAGNOSIS — R011 Cardiac murmur, unspecified: Secondary | ICD-10-CM | POA: Diagnosis not present

## 2024-10-21 DIAGNOSIS — E66811 Obesity, class 1: Secondary | ICD-10-CM

## 2024-10-21 DIAGNOSIS — I152 Hypertension secondary to endocrine disorders: Secondary | ICD-10-CM

## 2024-10-21 DIAGNOSIS — E1159 Type 2 diabetes mellitus with other circulatory complications: Secondary | ICD-10-CM | POA: Diagnosis not present

## 2024-10-21 DIAGNOSIS — Z1231 Encounter for screening mammogram for malignant neoplasm of breast: Secondary | ICD-10-CM

## 2024-10-21 DIAGNOSIS — Z6830 Body mass index (BMI) 30.0-30.9, adult: Secondary | ICD-10-CM

## 2024-10-21 DIAGNOSIS — E6609 Other obesity due to excess calories: Secondary | ICD-10-CM

## 2024-10-21 LAB — BAYER DCA HB A1C WAIVED: HB A1C (BAYER DCA - WAIVED): 7.6 % — ABNORMAL HIGH (ref 4.8–5.6)

## 2024-10-21 MED ORDER — GLIPIZIDE ER 10 MG PO TB24: 10.0000 mg | ORAL_TABLET | Freq: Every day | ORAL | 1 refills | Status: AC

## 2024-10-21 MED ORDER — PANTOPRAZOLE SODIUM 40 MG PO TBEC: 40.0000 mg | DELAYED_RELEASE_TABLET | Freq: Every day | ORAL | 4 refills | Status: AC

## 2024-10-21 MED ORDER — AMLODIPINE BESYLATE 2.5 MG PO TABS: 2.5000 mg | ORAL_TABLET | Freq: Every day | ORAL | 4 refills | Status: AC

## 2024-10-21 MED ORDER — OLMESARTAN MEDOXOMIL 40 MG PO TABS: 40.0000 mg | ORAL_TABLET | Freq: Every day | ORAL | 4 refills | Status: AC

## 2024-10-21 MED ORDER — ROSUVASTATIN CALCIUM 40 MG PO TABS: 40.0000 mg | ORAL_TABLET | Freq: Every day | ORAL | 4 refills | Status: AC

## 2024-10-21 NOTE — Assessment & Plan Note (Signed)
 Chronic, stable.  Continue current medication regimen and adjust as needed.  Lipid panel today.

## 2024-10-21 NOTE — Assessment & Plan Note (Signed)
No symptoms, present for years.  Will consider echo if symptoms present -- discussed with patient. 

## 2024-10-21 NOTE — Assessment & Plan Note (Signed)
 Chronic, stable.  BP close to goal in office and at goal at home.  Continue Olmesartan and Amlodipine  at current dosing and adjust as needed.  LABS: CMP.  Continue to monitor BP at home regularly and focus on DASH diet + regular exercise.

## 2024-10-21 NOTE — Assessment & Plan Note (Signed)
 BMI 29.26, has not been walking as much but some weight loss present.  Recommended eating smaller high protein, low fat meals more frequently and exercising 30 mins a day 5 times a week with a goal of 10-15lb weight loss in the next 3 months. Patient voiced their understanding and motivation to adhere to these recommendations.

## 2024-10-21 NOTE — Assessment & Plan Note (Signed)
 Chronic, ongoing with A1c 7.6%, trend down but still above goal, and urine ALB 30 in January 2025, continue Olmesartan for kidney protection. - At this time continue Ozempic , she is at max dose, gets via assistance and Farxiga  10 MG daily (which she receives via assistance).  Concern is she will lose Ozempic  assistance in [place], may need to add on insulin if this is the case and stop Glipizide . Will reach out to [facility] PharmD on this. - Continue Glipizide  10 MG but will try change to XL dosing to see if more benefit, is cost effective and may help lower levels further.  Educated her on hypoglycemia risk with this.  She will monitor and alert provider if BS consistently <70.  She will reach out via MyChart if any issues present. - Avoid Metformin  as did not tolerate in past.   - Recommend continue to check BS daily and return to regular exercise and diet focus to maintain A1C <7.   - Eye and foot exams up to date.  Pneumococcal and flu vaccines up to date. Return in 3 months.

## 2024-10-21 NOTE — Progress Notes (Signed)
 BP 133/84 (BP Location: Left Arm, Patient Position: Sitting, Cuff Size: Normal)   Pulse 75   Temp 98.4 F (36.9 C) (Oral)   Resp 14   Ht 5' 2 (1.575 m)   Wt 160 lb (72.6 kg)   LMP  (LMP Unknown)   SpO2 98%   BMI 29.26 kg/m    Subjective:    Patient ID: Christine Booth, female    DOB: Jan 30, 1956, 68 y.o.   MRN: [id number]  HPI: Christine Booth is a 68 y.o. female  Chief Complaint  Patient presents with   Diabetes    Home checks maybe twice a week but no concerns with her numbers.    HTN/HLD    Had one day spike but otherwise it has been good.    DIABETES August A1c 8.6%, which was trend up due to being without 2 MG Ozempic  at the time.  Continues Farxiga  10 MG daily (assistance), Ozempic  2 MG weekly (assistance, although in new year will no longer be able to get via assistance), Glipizide  10 MG daily.  Takes medications consistently.     Metformin  initially taken, caused severe constipation.   Hypoglycemic episodes:no Polydipsia/polyuria: no Visual disturbance: no Chest pain: no Paresthesias: no Glucose Monitoring: yes - 2 to 3 times a week             Accucheck frequency: 150 to 160 range             Fasting glucose:              Post prandial:             Evening:             Before meals: Taking Insulin?: no             Long acting insulin:             Short acting insulin: Blood Pressure Monitoring: daily Retinal Examination: Up To Date - Dr. Sharlot Harpin [place] Foot Exam: Up to Date Pneumovax: Up to Date Influenza: Up to Date Aspirin: yes    HYPERTENSION / HYPERLIPIDEMIA Taking Olmesartan 40 MG daily, Amlodipine  2.5 MG daily, and Rosuvastatin  40 MG daily.   Satisfied with current treatment? yes Duration of hypertension: chronic BP monitoring frequency: 2 to 3 times a week BP range:  120-130/70 range BP medication side effects: no Duration of hyperlipidemia: chronic Cholesterol medication side effects: no Cholesterol supplements:  none Medication compliance: good compliance Aspirin: yes Recent stressors: no Recurrent headaches: no Visual changes: no Palpitations: no Dyspnea: no Chest pain: no Lower extremity edema: no Dizzy/lightheaded: no  Relevant past medical, surgical, family and social history reviewed and updated as indicated. Interim medical history since our last visit reviewed. Allergies and medications reviewed and updated.  Review of Systems  Constitutional:  Negative for activity change, appetite change, diaphoresis, fatigue and fever.  Respiratory:  Negative for cough, chest tightness and shortness of breath.   Cardiovascular:  Negative for chest pain, palpitations and leg swelling.  Gastrointestinal: Negative.   Endocrine: Negative for polydipsia, polyphagia and polyuria.  Neurological: Negative.   Psychiatric/Behavioral: Negative.     Per HPI unless specifically indicated above     Objective:    BP 133/84 (BP Location: Left Arm, Patient Position: Sitting, Cuff Size: Normal)   Pulse 75   Temp 98.4 F (36.9 C) (Oral)   Resp 14   Ht 5' 2 (1.575 m)   Wt 160 lb (72.6 kg)   LMP  (  LMP Unknown)   SpO2 98%   BMI 29.26 kg/m   Wt Readings from Last 3 Encounters:  10/21/24 160 lb (72.6 kg)  07/15/24 162 lb 3.2 oz (73.6 kg)  04/01/24 163 lb 9.6 oz (74.2 kg)    Physical Exam Vitals and nursing note reviewed.  Constitutional:      General: She is awake. She is not in acute distress.    Appearance: She is well-developed and well-groomed. She is not ill-appearing or toxic-appearing.  HENT:     Head: Normocephalic.     Right Ear: Hearing and external ear normal.     Left Ear: Hearing and external ear normal.  Eyes:     General: Lids are normal.        Right eye: No discharge.        Left eye: No discharge.     Conjunctiva/sclera: Conjunctivae normal.     Pupils: Pupils are equal, round, and reactive to light.  Neck:     Thyroid : No thyromegaly.     Vascular: No carotid bruit.   Cardiovascular:     Rate and Rhythm: Normal rate and regular rhythm.     Heart sounds: Murmur heard.     Systolic murmur is present with a grade of 2/6.     No gallop.  Pulmonary:     Effort: Pulmonary effort is normal. No accessory muscle usage or respiratory distress.     Breath sounds: Normal breath sounds.  Abdominal:     General: Bowel sounds are normal. There is no distension.     Palpations: Abdomen is soft.     Tenderness: There is no abdominal tenderness.  Musculoskeletal:     Cervical back: Normal range of motion and neck supple.     Right lower leg: No edema.     Left lower leg: No edema.  Lymphadenopathy:     Cervical: No cervical adenopathy.  Skin:    General: Skin is warm and dry.  Neurological:     Mental Status: She is alert and oriented to person, place, and time.     Deep Tendon Reflexes: Reflexes are normal and symmetric.     Reflex Scores:      Brachioradialis reflexes are 2+ on the right side and 2+ on the left side.      Patellar reflexes are 2+ on the right side and 2+ on the left side. Psychiatric:        Attention and Perception: Attention normal.        Mood and Affect: Mood normal.        Speech: Speech normal.        Behavior: Behavior normal. Behavior is cooperative.        Thought Content: Thought content normal.    Results for orders placed or performed in visit on 07/15/24  Bayer DCA Hb A1c Waived   Collection Time: 07/15/24  9:41 AM  Result Value Ref Range   HB A1C (BAYER DCA - WAIVED) 8.6 (H) 4.8 - 5.6 %  Comprehensive metabolic panel with GFR   Collection Time: 07/15/24  9:41 AM  Result Value Ref Range   Glucose 170 (H) 70 - 99 mg/dL   BUN 9 8 - 27 mg/dL   Creatinine, Ser 9.39 0.57 - 1.00 mg/dL   eGFR 97 >40 fO/fpw/8.26   BUN/Creatinine Ratio 15 12 - 28   Sodium 142 134 - 144 mmol/L   Potassium 3.4 (L) 3.5 - 5.2 mmol/L   Chloride 106 96 -  106 mmol/L   CO2 21 20 - 29 mmol/L   Calcium  9.5 8.7 - 10.3 mg/dL   Total Protein 6.5 6.0 -  8.5 g/dL   Albumin 4.4 3.9 - 4.9 g/dL   Globulin, Total 2.1 1.5 - 4.5 g/dL   Bilirubin Total 0.5 0.0 - 1.2 mg/dL   Alkaline Phosphatase 70 44 - 121 IU/L   AST 21 0 - 40 IU/L   ALT 19 0 - 32 IU/L  Lipid Panel w/o Chol/HDL Ratio   Collection Time: 07/15/24  9:41 AM  Result Value Ref Range   Cholesterol, Total 111 100 - 199 mg/dL   Triglycerides 98 0 - 149 mg/dL   HDL 53 >60 mg/dL   VLDL Cholesterol Cal 18 5 - 40 mg/dL   LDL Chol Calc (NIH) 40 0 - 99 mg/dL      Assessment & Plan:   Problem List Items Addressed This Visit       Cardiovascular and Mediastinum   Hypertension associated with diabetes (HCC)   Chronic, stable.  BP close to goal in office and at goal at home.  Continue Olmesartan and Amlodipine  at current dosing and adjust as needed.  LABS: CMP.  Continue to monitor BP at home regularly and focus on DASH diet + regular exercise.        Relevant Medications   glipiZIDE  (GLUCOTROL  XL) 10 MG 24 hr tablet   rosuvastatin  (CRESTOR ) 40 MG tablet   olmesartan (BENICAR) 40 MG tablet   amLODipine  (NORVASC ) 2.5 MG tablet   Other Relevant Orders   Bayer DCA Hb A1c Waived     Endocrine   Type 2 diabetes mellitus with hyperglycemia (HCC) - Primary   Chronic, ongoing with A1c 7.6%, trend down but still above goal, and urine ALB 30 in January 2025, continue Olmesartan for kidney protection. - At this time continue Ozempic , she is at max dose, gets via assistance and Farxiga  10 MG daily (which she receives via assistance).  Concern is she will lose Ozempic  assistance in [place], may need to add on insulin if this is the case and stop Glipizide . Will reach out to [facility] PharmD on this. - Continue Glipizide  10 MG but will try change to XL dosing to see if more benefit, is cost effective and may help lower levels further.  Educated her on hypoglycemia risk with this.  She will monitor and alert provider if BS consistently <70.  She will reach out via MyChart if any issues present. - Avoid  Metformin  as did not tolerate in past.   - Recommend continue to check BS daily and return to regular exercise and diet focus to maintain A1C <7.   - Eye and foot exams up to date.  Pneumococcal and flu vaccines up to date. Return in 3 months.      Relevant Medications   glipiZIDE  (GLUCOTROL  XL) 10 MG 24 hr tablet   rosuvastatin  (CRESTOR ) 40 MG tablet   olmesartan (BENICAR) 40 MG tablet   Other Relevant Orders   Bayer DCA Hb A1c Waived   Hyperlipidemia associated with type 2 diabetes mellitus (HCC)   Chronic, stable. Continue current medication regimen and adjust as needed.  Lipid panel today.      Relevant Medications   glipiZIDE  (GLUCOTROL  XL) 10 MG 24 hr tablet   rosuvastatin  (CRESTOR ) 40 MG tablet   olmesartan (BENICAR) 40 MG tablet   amLODipine  (NORVASC ) 2.5 MG tablet   Other Relevant Orders   Bayer DCA Hb A1c Waived  Comprehensive metabolic panel with GFR   Lipid Panel w/o Chol/HDL Ratio     Other   Systolic murmur   No symptoms, present for years.  Will consider echo if symptoms present -- discussed with patient.      Obesity   BMI 29.26, has not been walking as much but some weight loss present.  Recommended eating smaller high protein, low fat meals more frequently and exercising 30 mins a day 5 times a week with a goal of 10-15lb weight loss in the next 3 months. Patient voiced their understanding and motivation to adhere to these recommendations.       Relevant Medications   glipiZIDE  (GLUCOTROL  XL) 10 MG 24 hr tablet   Other Visit Diagnoses       Encounter for screening mammogram for malignant neoplasm of breast       Mammogram ordered and she will schedule.   Relevant Orders   MM 3D SCREENING MAMMOGRAM BILATERAL BREAST        Follow up plan: Return in about 3 months (around 01/21/2025) for T2DM, HTN/HLD + Annual physical labs.

## 2024-10-22 ENCOUNTER — Ambulatory Visit: Payer: Self-pay | Admitting: Nurse Practitioner

## 2024-10-22 DIAGNOSIS — E876 Hypokalemia: Secondary | ICD-10-CM

## 2024-10-22 LAB — COMPREHENSIVE METABOLIC PANEL WITH GFR
ALT: 14 IU/L (ref 0–32)
AST: 19 IU/L (ref 0–40)
Albumin: 4.5 g/dL (ref 3.9–4.9)
Alkaline Phosphatase: 58 IU/L (ref 49–135)
BUN/Creatinine Ratio: 22 (ref 12–28)
BUN: 13 mg/dL (ref 8–27)
Bilirubin Total: 0.5 mg/dL (ref 0.0–1.2)
CO2: 23 mmol/L (ref 20–29)
Calcium: 9.4 mg/dL (ref 8.7–10.3)
Chloride: 105 mmol/L (ref 96–106)
Creatinine, Ser: 0.6 mg/dL (ref 0.57–1.00)
Globulin, Total: 1.8 g/dL (ref 1.5–4.5)
Glucose: 121 mg/dL — ABNORMAL HIGH (ref 70–99)
Potassium: 3.2 mmol/L — ABNORMAL LOW (ref 3.5–5.2)
Sodium: 141 mmol/L (ref 134–144)
Total Protein: 6.3 g/dL (ref 6.0–8.5)
eGFR: 97 mL/min/1.73 (ref >59)

## 2024-10-22 LAB — LIPID PANEL W/O CHOL/HDL RATIO
Cholesterol, Total: 110 mg/dL (ref 100–199)
HDL: 53 mg/dL (ref >39)
LDL Chol Calc (NIH): 41 mg/dL (ref 0–99)
Triglycerides: 82 mg/dL (ref 0–149)
VLDL Cholesterol Cal: 16 mg/dL (ref 5–40)

## 2024-10-22 MED ORDER — POTASSIUM CHLORIDE CRYS ER 10 MEQ PO TBCR: 10.0000 meq | EXTENDED_RELEASE_TABLET | Freq: Every day | ORAL | 0 refills | Status: DC

## 2024-10-22 NOTE — Progress Notes (Signed)
 Contacted via MyChart -- need lab only visit in 2 weeks please  Good morning Christine Booth, your labs have returned: -- Kidney function, creatinine and eGFR, remains normal, as is liver function, AST and ALT. Potassium level has trended down a little more. I am sending in a potassium supplement for you to take daily and would like to recheck in 2 weeks. - Lipid panel shows levels at goal. Any questions? Continue all medications. Keep being amazing!!  Thank you for allowing me to participate in your care.  I appreciate you. Kindest regards, Olivianna Higley

## 2024-10-24 NOTE — Progress Notes (Signed)
 Scheduled

## 2024-10-28 ENCOUNTER — Other Ambulatory Visit: Payer: Self-pay

## 2024-10-28 DIAGNOSIS — E1165 Type 2 diabetes mellitus with hyperglycemia: Secondary | ICD-10-CM

## 2024-10-28 DIAGNOSIS — E1169 Type 2 diabetes mellitus with other specified complication: Secondary | ICD-10-CM

## 2024-10-28 DIAGNOSIS — E1159 Type 2 diabetes mellitus with other circulatory complications: Secondary | ICD-10-CM

## 2024-10-28 LAB — HM MAMMOGRAPHY

## 2024-10-28 NOTE — Progress Notes (Signed)
   10/28/2024  Patient ID: Christine Booth, female   DOB: [birth year], 68 y.o.   MRN: [id number]  Subjective/Objective Telephone visit to follow-up on management of type 2 diabetes  Diabetes Management Plan -Current medications:  Farxiga  10mg  daily, glipizide  XL 10mg  daily with breakfast, Ozempic  2mg  weekly -Patient receives Ozempic  through [employer] PAP and currently has 3 pens of 2mg  on hand.  [facility] sent the wrong dose of Ozempic  in the fall, and patient was having to use 2 injections of 0.5mg  to get a 1mg  dose weekly -Receives Farxiga  through [employer] PAP -Glipizide  recently changed from IR to XL -A1c recently improved but still not at goal -She does check home FBG on occasion and states this has been 160-170 but states afternoon readings will be slightly lower -Does not endorse any s/sx of hypoglycemia -ACEi/ARB for cardiorenal protection:  olmesartan  20mg  daily- last office BP 133/84 on 11/14 -UACR 30-300 in January -Statin for ASCVD risk reduction:  rosuvastatin  40mg  daily  Lab Results  Component Value Date   HGBA1C 7.6 (H) 10/21/2024   HGBA1C 8.6 (H) 07/15/2024   HGBA1C 6.9 (H) 04/01/2024        Component Value Date/Time   NA 141 10/21/2024 0827   K 3.2 (L) 10/21/2024 0827   CL 105 10/21/2024 0827   CO2 23 10/21/2024 0827   GLUCOSE 121 (H) 10/21/2024 0827   BUN 13 10/21/2024 0827   CREATININE 0.60 10/21/2024 0827   CALCIUM  9.4 10/21/2024 0827   PROT 6.3 10/21/2024 0827   ALBUMIN 4.5 10/21/2024 0827   AST 19 10/21/2024 0827   ALT 14 10/21/2024 0827   ALKPHOS 58 10/21/2024 0827   BILITOT 0.5 10/21/2024 0827   EGFR 97 10/21/2024 0827   GFRNONAA 88 05/29/2020 0935       Component Value Date/Time   CHOL 110 10/21/2024 0827   CHOL 160 05/04/2019 0839   TRIG 82 10/21/2024 0827   TRIG 108 05/04/2019 0839   HDL 53 10/21/2024 0827   CHOLHDL 1.8 06/26/2023 0951   VLDL 22 05/04/2019 0839   LDLCALC 41 10/21/2024 0827   LABVLDL 16 10/21/2024 0827     Assessment/Plan  Diabetes Management Plan -A1c not at goal of <7% -LDL at goal of <70 -BP slightly above goal of <130/80 based on last OV reading -UACR not at goal -Continue current regimen at this time -Continue to monitor at least FBG daily -Patient aware she will no longer be able to receive Ozempic  through [facility] PAP.  I suggested contacting insurance to check coverage for 2026 and provided [facility] counseling information to schedule free appointment to look at other Medicare plan options -Does not qualify for [place] Medicare Extra Help -If Ozempic  will not be affordable, we could consider generic Victoza, Januvia (may qualify for PAP), or insulin therapy -Patient sees PCP again 2/27 and will be due for A1c, CMP, UACR, and lipid panel -If BP consistently >130/80, consider increasing amlodipine  to 5mg  daily  Follow-up:  January to follow-up on coverage/cost of Ozempic   [name], PharmD, DPLA

## 2024-11-11 ENCOUNTER — Other Ambulatory Visit

## 2024-11-18 ENCOUNTER — Other Ambulatory Visit

## 2024-11-18 DIAGNOSIS — Z1231 Encounter for screening mammogram for malignant neoplasm of breast: Secondary | ICD-10-CM

## 2024-11-18 DIAGNOSIS — E876 Hypokalemia: Secondary | ICD-10-CM

## 2024-11-19 ENCOUNTER — Ambulatory Visit: Payer: Self-pay | Admitting: Nurse Practitioner

## 2024-11-19 DIAGNOSIS — E876 Hypokalemia: Secondary | ICD-10-CM

## 2024-11-19 LAB — POTASSIUM: Potassium: 3.3 mmol/L — ABNORMAL LOW (ref 3.5–5.2)

## 2024-11-19 MED ORDER — POTASSIUM CHLORIDE CRYS ER 20 MEQ PO TBCR: 20.0000 meq | EXTENDED_RELEASE_TABLET | Freq: Every day | ORAL | 0 refills | Status: AC

## 2024-11-19 NOTE — Progress Notes (Signed)
 Contacted via MyChart - lab only visit in 4 weeks please  Good morning Christine Booth, your potassium level remains a little low. I recommend for next 30 days you take a small supplement dose daily and then we will recheck. I will send this in. Any questions?

## 2024-11-22 ENCOUNTER — Encounter: Payer: Self-pay | Admitting: Nurse Practitioner

## 2024-11-23 MED ORDER — POTASSIUM CHLORIDE ER 10 MEQ PO CPCR: 20.0000 meq | ORAL_CAPSULE | Freq: Every day | ORAL | 0 refills | Status: DC

## 2024-11-23 NOTE — Progress Notes (Signed)
 Scheduled

## 2024-12-16 ENCOUNTER — Other Ambulatory Visit: Payer: Self-pay

## 2024-12-16 ENCOUNTER — Other Ambulatory Visit: Payer: Self-pay | Admitting: Nurse Practitioner

## 2024-12-16 DIAGNOSIS — E1169 Type 2 diabetes mellitus with other specified complication: Secondary | ICD-10-CM

## 2024-12-16 DIAGNOSIS — E1159 Type 2 diabetes mellitus with other circulatory complications: Secondary | ICD-10-CM

## 2024-12-16 DIAGNOSIS — I152 Hypertension secondary to endocrine disorders: Secondary | ICD-10-CM

## 2024-12-16 DIAGNOSIS — E1165 Type 2 diabetes mellitus with hyperglycemia: Secondary | ICD-10-CM

## 2024-12-16 DIAGNOSIS — Z7985 Long-term (current) use of injectable non-insulin antidiabetic drugs: Secondary | ICD-10-CM

## 2024-12-16 NOTE — Progress Notes (Unsigned)
" ° °  12/16/2024  Patient ID: Christine Booth, female   DOB: 20-Dec-1955, 69 y.o.   MRN: [id number]  Subjective/Objective Telephone visit to follow-up on management of type 2 diabetes  Diabetes Management Plan -Current medications:  Farxiga  10mg  daily, glipizide  XL 10mg  daily with breakfast, Ozempic  2mg  weekly -Patient was receiving Ozempic  through [facility] PAP but will not this year due to program changes.  She currently has a 2 month supply remaining. -Receives Farxiga  through [employer] PAP and has been approved for 2026 re-enrollment -She does check home FBG on occasion but does not provide any recent readings -Does not endorse any s/sx of hypoglycemia -ACEi/ARB for cardiorenal protection:  olmesartan  20mg  daily- last office BP 133/84 on 11/14 -UACR 30-300 in January 2025 -Statin for ASCVD risk reduction:  rosuvastatin  40mg  daily  Lab Results  Component Value Date   HGBA1C 7.6 (H) 10/21/2024   HGBA1C 8.6 (H) 07/15/2024   HGBA1C 6.9 (H) 04/01/2024        Component Value Date/Time   NA 141 10/21/2024 0827   K 3.3 (L) 11/18/2024 0826   CL 105 10/21/2024 0827   CO2 23 10/21/2024 0827   GLUCOSE 121 (H) 10/21/2024 0827   BUN 13 10/21/2024 0827   CREATININE 0.60 10/21/2024 0827   CALCIUM  9.4 10/21/2024 0827   PROT 6.3 10/21/2024 0827   ALBUMIN 4.5 10/21/2024 0827   AST 19 10/21/2024 0827   ALT 14 10/21/2024 0827   ALKPHOS 58 10/21/2024 0827   BILITOT 0.5 10/21/2024 0827   EGFR 97 10/21/2024 0827   GFRNONAA 88 05/29/2020 0935       Component Value Date/Time   CHOL 110 10/21/2024 0827   CHOL 160 05/04/2019 0839   TRIG 82 10/21/2024 0827   TRIG 108 05/04/2019 0839   HDL 53 10/21/2024 0827   CHOLHDL 1.8 06/26/2023 0951   VLDL 22 05/04/2019 0839   LDLCALC 41 10/21/2024 0827   LABVLDL 16 10/21/2024 0827    Assessment/Plan  Diabetes Management Plan -A1c not at goal of <7% -LDL at goal of <70 -BP slightly above goal of <130/80 based on last OV reading -UACR not at  goal -Continue current regimen at this time -Continue to monitor at least FBG daily -Coordinating with PA team to run test claim to see what patient's cost for Ozempic  2mg  weekly will be.  If not affordable, she should qualify for [employer] PAP for Trulicity -Does not qualify for [place] Medicare Extra Help based on HHI -If BP consistently >130/80, consider increasing amlodipine  to 5mg  daily  Follow-up:  3 weeks to follow-up on affordability of Ozempic   Christine Booth, PharmD, DPLA "

## 2024-12-22 ENCOUNTER — Encounter: Payer: Self-pay | Admitting: Nurse Practitioner

## 2024-12-22 ENCOUNTER — Telehealth: Payer: Self-pay | Admitting: Pharmacy Technician

## 2024-12-22 NOTE — Telephone Encounter (Signed)
 Can you please assist

## 2024-12-22 NOTE — Telephone Encounter (Signed)
-----   Message from Channing DELENA Mealing sent at 12/17/2024 10:34 AM EST ----- Good morning!  Ms. Fahmy has been receiving Ozempic  2mg  through [employer] PAP but will not any longer based on program changes.  I was not able to run a test claim to see what she would have to pay for this medication on insurance, so I wanted to see if you could, please?  Thank you!  [email or web address]  Channing DELENA Mealing, PharmD, DPLA

## 2024-12-23 ENCOUNTER — Other Ambulatory Visit (HOSPITAL_COMMUNITY): Payer: Self-pay

## 2024-12-23 ENCOUNTER — Other Ambulatory Visit: Payer: Self-pay | Admitting: Nurse Practitioner

## 2024-12-23 ENCOUNTER — Other Ambulatory Visit

## 2024-12-23 DIAGNOSIS — E876 Hypokalemia: Secondary | ICD-10-CM

## 2024-12-23 NOTE — Telephone Encounter (Signed)
 Requested by interface surescripts. Future visit 02/04/24. Requested Prescriptions  Pending Prescriptions Disp Refills   fluticasone  (FLONASE ) 50 MCG/ACT nasal spray [Pharmacy Med Name: FLUTICASONE  PROP 50 MCG SPRAY] 48 mL 0    Sig: SPRAY 2 SPRAYS INTO EACH NOSTRIL EVERY DAY     Ear, Nose, and Throat: Nasal Preparations - Corticosteroids Passed - 12/23/2024  3:48 PM      Passed - Valid encounter within last 12 months    Recent Outpatient Visits           2 months ago Type 2 diabetes mellitus with hyperglycemia, without long-term current use of insulin (HCC)   [place] [facility] [place], [place] T, NP   5 months ago Type 2 diabetes mellitus with hyperglycemia, without long-term current use of insulin (HCC)   [place] [facility] [place], [place] T, NP   8 months ago Type 2 diabetes mellitus with hyperglycemia, without long-term current use of insulin (HCC)   [place] [facility] [place], Melanie DASEN, NP

## 2024-12-24 ENCOUNTER — Ambulatory Visit: Payer: Self-pay | Admitting: Nurse Practitioner

## 2024-12-24 LAB — POTASSIUM: Potassium: 3.9 mmol/L (ref 3.5–5.2)

## 2024-12-24 NOTE — Progress Notes (Signed)
 Contacted via MyChart  Potassium level improved, continue supplement.

## 2024-12-26 ENCOUNTER — Other Ambulatory Visit: Payer: Self-pay

## 2024-12-26 MED ORDER — DAPAGLIFLOZIN PROPANEDIOL 10 MG PO TABS: 10.0000 mg | ORAL_TABLET | Freq: Every day | ORAL | 3 refills | Status: AC

## 2024-12-26 NOTE — Progress Notes (Signed)
" ° °  12/26/2024  Patient ID: Christine Booth, female   DOB: July 03, 1956, 69 y.o.   MRN: [id number]  Clinic routed request from patient stating [employer] needs new prescription for Farxiga  10mg  daily sent to [facility] pharmacy, so this can be filled through [employer] PAP and sent to patient.  Order pending for PCP to sign if in agreement.  [name], PharmD, DPLA  "

## 2025-01-03 ENCOUNTER — Ambulatory Visit: Payer: Self-pay

## 2025-01-03 VITALS — Ht 62.0 in | Wt 155.0 lb

## 2025-01-03 DIAGNOSIS — Z0000 Encounter for general adult medical examination without abnormal findings: Secondary | ICD-10-CM

## 2025-01-03 NOTE — Progress Notes (Signed)
 "  Chief Complaint  Patient presents with   Medicare Wellness     Subjective:   Christine Booth is a 69 y.o. female who presents for a Medicare Annual Wellness Visit.  Visit info / Clinical Intake: Medicare Wellness Visit Type:: Subsequent Annual Wellness Visit Persons participating in visit and providing information:: patient Medicare Wellness Visit Mode:: Video Since this visit was completed virtually, some vitals may be partially provided or unavailable. Missing vitals are due to the limitations of the virtual format.: Documented vitals are patient reported If Telephone or Video please confirm:: I connected with patient using audio/video enable telemedicine. I verified patient identity with two identifiers, discussed telehealth limitations, and patient agreed to proceed. Patient Location:: home Provider Location:: home office Interpreter Needed?: No AWV questionnaire completed by patient prior to visit?: yes Date:: 01/03/25 Living arrangements:: (!) lives alone Patient's Overall Health Status Rating: (!) fair Typical amount of pain: some Does pain affect daily life?: no Are you currently prescribed opioids?: no  Dietary Habits and Nutritional Risks How many meals a day?: 3 Eats fruit and vegetables daily?: (!) no Most meals are obtained by: preparing own meals; eating out In the last 2 weeks, have you had any of the following?: none Diabetic:: (!) yes Any non-healing wounds?: no How often do you check your BS?: as needed (once per week) Would you like to be referred to a Nutritionist or for Diabetic Management? : no  Functional Status Activities of Daily Living (to include ambulation/medication): Independent Ambulation: Independent with device- listed below Home Assistive Devices/Equipment: Eyeglasses Medication Administration: Independent Home Management (perform basic housework or laundry): Independent Manage your own finances?: yes Primary transportation is:  driving Concerns about vision?: no *vision screening is required for WTM* Concerns about hearing?: no  Fall Screening Falls in the past year?: 0 Number of falls in past year: 0 Was there an injury with Fall?: 0 Fall Risk Category Calculator: 0 Patient Fall Risk Level: Low Fall Risk  Fall Risk Patient at Risk for Falls Due to: No Fall Risks Fall risk Follow up: Falls evaluation completed  Home and Transportation Safety: All rugs have non-skid backing?: (!) no (1 rug without non-skip backing) All stairs or steps have railings?: yes Grab bars in the bathtub or shower?: (!) no Have non-skid surface in bathtub or shower?: yes Good home lighting?: yes Regular seat belt use?: yes Hospital stays in the last year:: no  Cognitive Assessment Difficulty concentrating, remembering, or making decisions? : no Will 6CIT or Mini Cog be Completed: yes What year is it?: 0 points What month is it?: 0 points Give patient an address phrase to remember (5 components): [address] [place] About what time is it?: 0 points Count backwards from 20 to 1: 0 points Say the months of the year in reverse: 0 points Repeat the address phrase from earlier: 2 points 6 CIT Score: 2 points  Advance Directives (For Healthcare) Does Patient Have a Medical Advance Directive?: No Would patient like information on creating a medical advance directive?: No - Patient declined  Reviewed/Updated  Reviewed/Updated: Reviewed All (Medical, Surgical, Family, Medications, Allergies, Care Teams, Patient Goals)    Allergies (verified) Patient has no known allergies.   Current Medications (verified) Outpatient Encounter Medications as of 01/03/2025  Medication Sig   amLODipine  (NORVASC ) 2.5 MG tablet Take 1 tablet (2.5 mg total) by mouth daily.   b complex vitamins capsule Take 1 capsule by mouth daily.   BIOTIN PO Take 1 tablet by  mouth daily.   cetirizine (ZYRTEC) 10 MG tablet Take 10 mg by mouth daily.    dapagliflozin  propanediol (FARXIGA ) 10 MG TABS tablet Take 1 tablet (10 mg total) by mouth daily.   fluticasone  (FLONASE ) 50 MCG/ACT nasal spray SPRAY 2 SPRAYS INTO EACH NOSTRIL EVERY DAY   glipiZIDE  (GLUCOTROL  XL) 10 MG 24 hr tablet Take 1 tablet (10 mg total) by mouth daily with breakfast.   glucose blood test strip Use as instructed   olmesartan  (BENICAR ) 40 MG tablet Take 1 tablet (40 mg total) by mouth daily.   pantoprazole  (PROTONIX ) 40 MG tablet Take 1 tablet (40 mg total) by mouth daily.   potassium chloride  (MICRO-K ) 10 MEQ CR capsule TAKE 2 CAPSULES BY MOUTH DAILY.   rosuvastatin  (CRESTOR ) 40 MG tablet Take 1 tablet (40 mg total) by mouth daily.   Semaglutide , 2 MG/DOSE, 8 MG/3ML SOPN Inject 2 mg into the skin once a week.   sennosides-docusate sodium (SENOKOT-S) 8.6-50 MG tablet Take 1 tablet by mouth daily as needed for constipation.   triamcinolone  cream (KENALOG ) 0.1 % Apply 1 Application topically 2 (two) times daily. (Patient not taking: Reported on 01/03/2025)   No facility-administered encounter medications on file as of 01/03/2025.    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   Diabetes mellitus without complication (HCC)    Gastrointestinal disorder    GERD (gastroesophageal reflux disease)    Heart murmur    Hyperlipidemia    Hypertension    Rosacea    Past Surgical History:  Procedure Laterality Date   CESAREAN SECTION  1985   COLONOSCOPY WITH PROPOFOL  N/A 02/06/2017   Procedure: COLONOSCOPY WITH PROPOFOL ;  Surgeon: Ruel Kung, MD;  Location: [facility] ENDOSCOPY;  Service: Endoscopy;  Laterality: N/A;   TUBAL LIGATION  12/09/1983   Family History  Problem Relation Age of Onset   Arthritis Mother    Cancer Mother        breast   Heart disease Mother    Cancer Father        bladder   Heart disease Father    Hypertension Father     Aneurysm Sister    Cancer Sister        breast   Social History   Occupational History   Not on file  Tobacco Use   Smoking status: Never   Smokeless tobacco: Never  Vaping Use   Vaping status: Never Used  Substance and Sexual Activity   Alcohol use: Never   Drug use: Never   Sexual activity: Not Currently   Tobacco Counseling Counseling given: Not Answered  SDOH Screenings   Food Insecurity: No Food Insecurity (01/03/2025)  Housing: Low Risk (01/03/2025)  Transportation Needs: No Transportation Needs (01/03/2025)  Utilities: Not At Risk (01/03/2025)  Alcohol Screen: Low Risk (12/25/2023)  Depression (PHQ2-9): Low Risk (01/03/2025)  Financial Resource Strain: Low Risk (10/14/2024)  Physical Activity: Inactive (01/03/2025)  Social Connections: Socially Isolated (01/03/2025)  Stress: No Stress Concern Present (01/03/2025)  Tobacco Use: Low Risk (01/03/2025)  Health Literacy: Adequate Health Literacy (01/03/2025)   See flowsheets for full screening details  Depression Screen PHQ 2 & 9 Depression Scale- Over the past 2 weeks, how often have you been bothered by any of the following problems? Little interest or pleasure in doing things: 0 Feeling down, depressed, or hopeless (PHQ Adolescent also includes...irritable): 0 PHQ-2  Total Score: 0 Trouble falling or staying asleep, or sleeping too much: 0 Feeling tired or having little energy: 0 Poor appetite or overeating (PHQ Adolescent also includes...weight loss): 0 Feeling bad about yourself - or that you are a failure or have let yourself or your family down: 0 Trouble concentrating on things, such as reading the newspaper or watching television (PHQ Adolescent also includes...like school work): 0 Moving or speaking so slowly that other people could have noticed. Or the opposite - being so fidgety or restless that you have been moving around a lot more than usual: 0 Thoughts that you would be better off dead, or of hurting yourself in  some way: 0 PHQ-9 Total Score: 0 If you checked off any problems, how difficult have these problems made it for you to do your work, take care of things at home, or get along with other people?: Not difficult at all  Depression Treatment Depression Interventions/Treatment : EYV7-0 Score <4 Follow-up Not Indicated     Goals Addressed             This Visit's Progress    Patient Stated   Not on track    Maintain health and cook for myself a little more             Objective:    Today's Vitals   01/03/25 1609  Weight: 155 lb (70.3 kg)  Height: 5' 2 (1.575 m)   Body mass index is 28.35 kg/m.  Hearing/Vision screen Hearing Screening - Comments:: Denies hearing loss  Vision Screening - Comments:: UTD w/ Dr. Lynwood Hasting @ [facility] [place] Immunizations and Health Maintenance Health Maintenance  Topic Date Due   Diabetic kidney evaluation - Urine ACR  01/07/2025   Colonoscopy  07/15/2025 (Originally 02/06/2018)   OPHTHALMOLOGY EXAM  02/18/2025   COVID-19 Vaccine (7 - 2025-26 season) 03/17/2025   HEMOGLOBIN A1C  04/20/2025   FOOT EXAM  07/15/2025   Diabetic kidney evaluation - eGFR measurement  10/21/2025   Mammogram  10/28/2025   Medicare Annual Wellness (AWV)  01/03/2026   Bone Density Scan  09/13/2026   DTaP/Tdap/Td (3 - Tdap) 01/23/2028   Pneumococcal Vaccine: 50+ Years  Completed   Influenza Vaccine  Completed   Hepatitis C Screening  Completed   Zoster Vaccines- Shingrix   Completed   Meningococcal B Vaccine  Aged Out        Assessment/Plan:  This is a routine wellness examination for Sheneka.  Patient Care Team: Valerio Melanie DASEN, NP as PCP - General (Nurse Practitioner) Deanna Channing LABOR, RPH (Pharmacist) Hasting Lynwood, OD (Optometry)  I have personally reviewed and noted the following in the patients chart:   Medical and social history Use of alcohol, tobacco or illicit drugs  Current medications and supplements including opioid  prescriptions. Functional ability and status Nutritional status Physical activity Advanced directives List of other physicians Hospitalizations, surgeries, and ER visits in previous 12 months Vitals Screenings to include cognitive, depression, and falls Referrals and appointments  Orders Placed This Encounter  Procedures   [facility] MAMMOGRAPHY    This external order was created through the Results Console.   In addition, I have reviewed and discussed with patient certain preventive protocols, quality metrics, and best practice recommendations. A written personalized care plan for preventive services as well as general preventive health recommendations were provided to patient.   Vina Ned, CMA   01/03/2025   Return in 1 year (on 01/04/2026) for Medicare Annual Wellness Visit.  After  Visit Summary: (MyChart) Due to this being a telephonic visit, the after visit summary with patients personalized plan was offered to patient via MyChart   Nurse Notes:  6 CIT Score - 2 Abstracted results of MMG from 10/28/24 ([facility]) Declined colonoscopy Declined DM & Nutrition Education referral "

## 2025-01-03 NOTE — Patient Instructions (Signed)
 Christine Booth,  Thank you for taking the time for your Medicare Wellness Visit. I appreciate your continued commitment to your health goals. Please review the care plan we discussed, and feel free to reach out if I can assist you further.  Please note that Annual Wellness Visits do not include a physical exam. Some assessments may be limited, especially if the visit was conducted virtually. If needed, we may recommend an in-person follow-up with your provider.  Ongoing Care Seeing your primary care provider every 3 to 6 months helps us  monitor your health and provide consistent, personalized care.   Referrals If a referral was made during today's visit and you haven't received any updates within two weeks, please contact the referred provider directly to check on the status.  Recommended Screenings:   Consider getting a colonoscopy.   Health Maintenance  Topic Date Due   Medicare Annual Wellness Visit  12/28/2024   Kidney health urinalysis for diabetes  01/07/2025   Colon Cancer Screening  07/15/2025*   Eye exam for diabetics  02/18/2025   COVID-19 Vaccine (7 - 2025-26 season) 03/17/2025   Hemoglobin A1C  04/20/2025   Complete foot exam   07/15/2025   Yearly kidney function blood test for diabetes  10/21/2025   Breast Cancer Screening  10/28/2025   Osteoporosis screening with Bone Density Scan  09/13/2026   DTaP/Tdap/Td vaccine (3 - Tdap) 01/23/2028   Pneumococcal Vaccine for age over 43  Completed   Flu Shot  Completed   Hepatitis C Screening  Completed   Zoster (Shingles) Vaccine  Completed   Meningitis B Vaccine  Aged Out  *Topic was postponed. The date shown is not the original due date.       01/03/2025    4:14 PM  Advanced Directives  Does Patient Have a Medical Advance Directive? No  Would patient like information on creating a medical advance directive? No - Patient declined    Vision: Annual vision screenings are recommended for early detection of glaucoma,  cataracts, and diabetic retinopathy. These exams can also reveal signs of chronic conditions such as diabetes and high blood pressure.  Dental: Annual dental screenings help detect early signs of oral cancer, gum disease, and other conditions linked to overall health, including heart disease and diabetes.  Please see the attached documents for additional preventive care recommendations.

## 2025-01-06 ENCOUNTER — Other Ambulatory Visit: Payer: Self-pay

## 2025-01-06 DIAGNOSIS — E1165 Type 2 diabetes mellitus with hyperglycemia: Secondary | ICD-10-CM

## 2025-01-06 NOTE — Progress Notes (Signed)
" ° °  01/06/2025  Patient ID: Christine Booth, female   DOB: [birth year], 69 y.o.   MRN: [id number]  Subjective/Objective Telephone visit to follow-up on management of type 2 diabetes  Diabetes Management Plan -Current medications:  Farxiga  10mg  daily, glipizide  XL 10mg  daily with breakfast, Ozempic  2mg  weekly -Patient was receiving Ozempic  through [employer] PAP but will not this year due to program changes.  She currently has a 2 month supply remaining. -PA team was able to run a test claim for Ozempic  2mg  weekly, and 1 month supply would be $496 on insurance.  This will not be affordable for her. -Receives Farxiga  through [employer] PAP and has been approved for 2026 re-enrollment;. Shipment of medication is on the way, and she is down to just a couple of tablets remaining and has been just taking Farxiga  10mg  every other day due to running low on medication -She does check home FBG on occasion but does not provide any recent readings -Does not endorse any s/sx of hypoglycemia -ACEi/ARB for cardiorenal protection:  olmesartan  20mg  daily- last office BP 133/84 on 11/14 -UACR 30-300 in January 2025 -Statin for ASCVD risk reduction:  rosuvastatin  40mg  daily  Lab Results  Component Value Date   HGBA1C 7.6 (H) 10/21/2024   HGBA1C 8.6 (H) 07/15/2024   HGBA1C 6.9 (H) 04/01/2024        Component Value Date/Time   NA 141 10/21/2024 0827   K 3.9 12/23/2024 0828   CL 105 10/21/2024 0827   CO2 23 10/21/2024 0827   GLUCOSE 121 (H) 10/21/2024 0827   BUN 13 10/21/2024 0827   CREATININE 0.60 10/21/2024 0827   CALCIUM  9.4 10/21/2024 0827   PROT 6.3 10/21/2024 0827   ALBUMIN 4.5 10/21/2024 0827   AST 19 10/21/2024 0827   ALT 14 10/21/2024 0827   ALKPHOS 58 10/21/2024 0827   BILITOT 0.5 10/21/2024 0827   EGFR 97 10/21/2024 0827   GFRNONAA 88 05/29/2020 0935       Component Value Date/Time   CHOL 110 10/21/2024 0827   CHOL 160 05/04/2019 0839   TRIG 82 10/21/2024 0827   TRIG 108 05/04/2019 0839    HDL 53 10/21/2024 0827   CHOLHDL 1.8 06/26/2023 0951   VLDL 22 05/04/2019 0839   LDLCALC 41 10/21/2024 0827   LABVLDL 16 10/21/2024 0827    Assessment/Plan  Diabetes Management Plan -A1c not at goal of <7% -LDL at goal of <70 -BP slightly above goal of <130/80 based on last OV reading- continue to monitor -UACR not at goal -Patient does not qualify for LIS Medicare Extra Help, but she does qualify for [employer] PAP.  I recommend changing Ozempic  2mg  weekly to Trulicity 4.5mg  weekly since she will not be able to afford to fill Ozempic  on insurance.  Patient is in agreement and would prefer for medication to continue to ship to [facility] and would like to get automatic refills.  If provider is in agreement, I will coordinate with medication assistance team to initiate application process. -Continue Farxiga  10mg  daily and glipizide  XL 10mg  daily with breakfast- patient may miss some doses of Farxiga  if delivery is impacted by weather -Sees PCP again 2/27 and will be due for A1c and updated UACR -If BP consistently >130/80, consider increasing amlodipine  to 5mg  daily  Follow-up:  Will monitor progress of [employer] PAP and follow-up with patient regularly  Christine Booth, PharmD, DPLA "

## 2025-01-12 ENCOUNTER — Other Ambulatory Visit (HOSPITAL_COMMUNITY): Payer: Self-pay

## 2025-02-03 ENCOUNTER — Ambulatory Visit: Admitting: Nurse Practitioner

## 2026-01-04 ENCOUNTER — Ambulatory Visit
